# Patient Record
Sex: Female | Born: 1960 | Race: Black or African American | Hispanic: No | Marital: Single | Smoking: Current every day smoker
Health system: Southern US, Community
[De-identification: ages and names within clinical notes are randomized; demographics above are authoritative.]

## PROBLEM LIST (undated history)

## (undated) DIAGNOSIS — J45909 Unspecified asthma, uncomplicated: Secondary | ICD-10-CM

## (undated) DIAGNOSIS — F419 Anxiety disorder, unspecified: Secondary | ICD-10-CM

## (undated) DIAGNOSIS — T7840XA Allergy, unspecified, initial encounter: Secondary | ICD-10-CM

## (undated) DIAGNOSIS — E119 Type 2 diabetes mellitus without complications: Secondary | ICD-10-CM

## (undated) DIAGNOSIS — F32A Depression, unspecified: Secondary | ICD-10-CM

## (undated) DIAGNOSIS — D649 Anemia, unspecified: Secondary | ICD-10-CM

## (undated) DIAGNOSIS — F329 Major depressive disorder, single episode, unspecified: Secondary | ICD-10-CM

## (undated) DIAGNOSIS — C801 Malignant (primary) neoplasm, unspecified: Secondary | ICD-10-CM

## (undated) DIAGNOSIS — E7800 Pure hypercholesterolemia, unspecified: Secondary | ICD-10-CM

## (undated) DIAGNOSIS — H3552 Pigmentary retinal dystrophy: Secondary | ICD-10-CM

## (undated) DIAGNOSIS — H269 Unspecified cataract: Secondary | ICD-10-CM

## (undated) DIAGNOSIS — K219 Gastro-esophageal reflux disease without esophagitis: Secondary | ICD-10-CM

## (undated) DIAGNOSIS — I10 Essential (primary) hypertension: Secondary | ICD-10-CM

## (undated) DIAGNOSIS — R011 Cardiac murmur, unspecified: Secondary | ICD-10-CM

## (undated) DIAGNOSIS — K529 Noninfective gastroenteritis and colitis, unspecified: Secondary | ICD-10-CM

## (undated) DIAGNOSIS — G4730 Sleep apnea, unspecified: Secondary | ICD-10-CM

## (undated) DIAGNOSIS — I639 Cerebral infarction, unspecified: Secondary | ICD-10-CM

## (undated) HISTORY — DX: Type 2 diabetes mellitus without complications: E11.9

## (undated) HISTORY — PX: COLONOSCOPY: SHX174

## (undated) HISTORY — PX: RETINAL CRYOABLATION: SHX2337

## (undated) HISTORY — DX: Anxiety disorder, unspecified: F41.9

## (undated) HISTORY — DX: Anemia, unspecified: D64.9

## (undated) HISTORY — DX: Noninfective gastroenteritis and colitis, unspecified: K52.9

## (undated) HISTORY — DX: Unspecified cataract: H26.9

## (undated) HISTORY — DX: Cerebral infarction, unspecified: I63.9

## (undated) HISTORY — PX: IUD REMOVAL: SHX5392

## (undated) HISTORY — DX: Allergy, unspecified, initial encounter: T78.40XA

## (undated) HISTORY — PX: CATARACT EXTRACTION: SUR2

## (undated) HISTORY — PX: BONE GRAFT HIP ILIAC CREST: SUR159

## (undated) HISTORY — DX: Malignant (primary) neoplasm, unspecified: C80.1

---

## 1999-12-17 ENCOUNTER — Encounter: Payer: Self-pay | Admitting: Obstetrics & Gynecology

## 1999-12-17 ENCOUNTER — Encounter (INDEPENDENT_AMBULATORY_CARE_PROVIDER_SITE_OTHER): Payer: Self-pay

## 1999-12-17 ENCOUNTER — Ambulatory Visit (HOSPITAL_COMMUNITY): Admission: RE | Admit: 1999-12-17 | Discharge: 1999-12-17 | Payer: Self-pay | Admitting: *Deleted

## 1999-12-23 ENCOUNTER — Ambulatory Visit (HOSPITAL_COMMUNITY): Admission: RE | Admit: 1999-12-23 | Discharge: 1999-12-23 | Payer: Self-pay | Admitting: Orthopaedic Surgery

## 1999-12-23 ENCOUNTER — Encounter: Payer: Self-pay | Admitting: Orthopaedic Surgery

## 2000-04-04 ENCOUNTER — Ambulatory Visit (HOSPITAL_BASED_OUTPATIENT_CLINIC_OR_DEPARTMENT_OTHER): Admission: RE | Admit: 2000-04-04 | Discharge: 2000-04-04 | Payer: Self-pay | Admitting: Orthopaedic Surgery

## 2001-06-06 ENCOUNTER — Ambulatory Visit (HOSPITAL_BASED_OUTPATIENT_CLINIC_OR_DEPARTMENT_OTHER): Admission: RE | Admit: 2001-06-06 | Discharge: 2001-06-06 | Payer: Self-pay | Admitting: Orthopedic Surgery

## 2002-06-20 HISTORY — PX: OOPHORECTOMY: SHX86

## 2003-10-22 ENCOUNTER — Ambulatory Visit (HOSPITAL_BASED_OUTPATIENT_CLINIC_OR_DEPARTMENT_OTHER): Admission: RE | Admit: 2003-10-22 | Discharge: 2003-10-22 | Payer: Self-pay | Admitting: Orthopedic Surgery

## 2003-10-22 ENCOUNTER — Ambulatory Visit (HOSPITAL_COMMUNITY): Admission: RE | Admit: 2003-10-22 | Discharge: 2003-10-22 | Payer: Self-pay | Admitting: Orthopedic Surgery

## 2004-06-30 ENCOUNTER — Encounter: Admission: RE | Admit: 2004-06-30 | Discharge: 2004-06-30 | Payer: Self-pay | Admitting: Orthopedic Surgery

## 2004-07-08 ENCOUNTER — Other Ambulatory Visit: Admission: RE | Admit: 2004-07-08 | Discharge: 2004-07-08 | Payer: Self-pay | Admitting: Obstetrics and Gynecology

## 2004-07-13 ENCOUNTER — Ambulatory Visit (HOSPITAL_COMMUNITY): Admission: RE | Admit: 2004-07-13 | Discharge: 2004-07-13 | Payer: Self-pay | Admitting: Obstetrics and Gynecology

## 2004-08-24 ENCOUNTER — Other Ambulatory Visit: Admission: RE | Admit: 2004-08-24 | Discharge: 2004-08-24 | Payer: Self-pay | Admitting: Obstetrics and Gynecology

## 2004-08-25 ENCOUNTER — Ambulatory Visit (HOSPITAL_BASED_OUTPATIENT_CLINIC_OR_DEPARTMENT_OTHER): Admission: RE | Admit: 2004-08-25 | Discharge: 2004-08-25 | Payer: Self-pay | Admitting: Orthopedic Surgery

## 2005-02-23 ENCOUNTER — Other Ambulatory Visit: Admission: RE | Admit: 2005-02-23 | Discharge: 2005-02-23 | Payer: Self-pay | Admitting: Obstetrics and Gynecology

## 2005-03-02 ENCOUNTER — Ambulatory Visit (HOSPITAL_COMMUNITY): Admission: RE | Admit: 2005-03-02 | Discharge: 2005-03-02 | Payer: Self-pay | Admitting: Obstetrics and Gynecology

## 2005-03-02 ENCOUNTER — Encounter (INDEPENDENT_AMBULATORY_CARE_PROVIDER_SITE_OTHER): Payer: Self-pay | Admitting: Specialist

## 2009-04-12 ENCOUNTER — Emergency Department (HOSPITAL_COMMUNITY): Admission: EM | Admit: 2009-04-12 | Discharge: 2009-04-12 | Payer: Self-pay | Admitting: Emergency Medicine

## 2009-08-27 ENCOUNTER — Emergency Department (HOSPITAL_COMMUNITY): Admission: EM | Admit: 2009-08-27 | Discharge: 2009-08-28 | Payer: Self-pay | Admitting: Emergency Medicine

## 2009-12-30 ENCOUNTER — Encounter: Admission: RE | Admit: 2009-12-30 | Discharge: 2010-03-15 | Payer: Self-pay | Admitting: Ophthalmology

## 2010-01-13 ENCOUNTER — Emergency Department (HOSPITAL_COMMUNITY): Admission: EM | Admit: 2010-01-13 | Discharge: 2010-01-13 | Payer: Self-pay | Admitting: Emergency Medicine

## 2010-11-05 NOTE — Op Note (Signed)
NAME:  Susan Farley, Susan Farley              ACCOUNT NO.:  [id number]   MEDICAL RECORD NO.:  [id number]          PATIENT TYPE:  AMB   LOCATION:  SDC                           FACILITY:  [facility]   PHYSICIAN:  Guy Sandifer. Henderson Cloud, M.D. DATE OF BIRTH:  04-26-1961   DATE OF PROCEDURE:  03/02/2005  DATE OF DISCHARGE:                                 OPERATIVE REPORT   PREOPERATIVE DIAGNOSIS:  1.  Uterine leiomyomata.  2.  Uterine endocavitary masses.  3.  Right ovarian cyst.   POSTOPERATIVE DIAGNOSIS:  1.  Severe endometriosis.  2.  Right ovarian endometrioma.  3.  Uterine leiomyomata.   PROCEDURE:  Laparoscopy with right salpingo-oophorectomy. Hysteroscopy with  resection of submucous fibroid. Dilatation curettage and 1% Xylocaine  paracervical block.   SURGEON:  Guy Sandifer. Henderson Cloud, M.D.   ANESTHESIA:  General with endotracheal intubation.   SPECIMEN:  1.  Right tube and ovary.  2.  Submucous fibroid.  3.  Endometrial mass.   ESTIMATED BLOOD LOSS:  50 mL.   I&O OF SORBITOL DISTENDING MEDIA:  60 mL deficit (a portion of that on the  floor)   INDICATIONS AND CONSENT:  The patient is a 50 year old single black female,  gravida 1, para 0, status post endometrial ablation with increasingly  frequent menses. Details are dictated history and physical. Hysteroscopy  with resectoscope, D&C, laparoscopy with probable right salpingo-  oophorectomy has been discussed with the patient. Potential risks and  complications have been reviewed preoperatively including but not limited to  infection, bowel, bladder, ureteral damage, bleeding requiring transfusion  of blood products with possible transfusion reaction, HIV and hepatitis  acquisition, DVT, PE, pneumonia, laparotomy, recurrent pain or abnormal  bleeding. All questions were answered and consent signed on the chart.   FINDINGS:  There is an adhesion of the omentum just superior to the  umbilicus. This was not involve the bowel. There are no closed  loops. In the  pelvis, uterus has multiple subserous leiomyomata including at least one  approximately 3-4 cm pedunculated on the anterior fundus and at least one 2-  3 cm pedunculated on the posterior uterine fundus. The peritoneum contains  diffuse hemosiderin deposition in the cul-de-sacs. The left ovary appears  normal. Left fallopian tube is normal. Right ovary contains a 6 cm  translucent cyst. It is adherent to the right pelvic side wall with filmy  adhesions. The fimbria of the right fallopian tubes are adherent beneath the  right ovary.   PROCEDURE:  The patient is taken to operating room where she is identified,  placed in dorsosupine position and general anesthesia is induced via  endotracheal intubation. She is then placed in dorsal lithotomy position  where she is prepped abdominally and vaginally with a prep soap, straight  catheterized and draped in sterile fashion. Bivalve speculum was placed in  the vagina. The anterior cervical lip was injected with 1% Xylocaine and  grasped with single-tooth tenaculum. Paracervical block is placed at 2, 4,  7, 8, 10 o'clock positions with approximately 20 mL total of 1% plain  Xylocaine. Cervix was gently progressively dilated to 33  Pratt dilator. The  resectoscope with a single right-angle wire loop was then placed in  endocervical canal advanced under direct visualization using sorbitol  distending media. There is a 1 cm submucous fibroid on a relatively narrow  stalk on the left posterior endometrial canal. Fallopian tube ostia are  identified bilaterally. There is a second area of tissue on the posterior  endometrial canal possibly consistent with a polyp. Both of these were  resected separately without difficulty. Hysteroscope was removed and sharp  curettage is carried out. The single-tooth tenaculum was replaced with a  Hulka tenaculum. Attention is turned to the abdomen. An infraumbilical  incision was made and a disposable  Veress needle was placed. Normal syringe  and drop test were noted. 2 liters of gas were insufflated under low  pressure with good tympany in the right upper quadrant. Veress needle is  removed and a 10/11 disposable XL bladeless trocar sleeve was placed using  the direct visualization method using the laparoscope. Placement is further  verified with the laparoscope and trocar was then removed and no damage to  surrounding structures is noted. Small suprapubic incision was made and a 5  mm bladeless disposable XL trocar sleeve was placed under direct  visualization without difficulty. The above findings were noted. The right  ovarian cyst is aspirated for a small amount of old blood consistent with an  endometrioma. Portion of this was sent to the lab for cytology. The  remainder of the cyst was drained and irrigated with the irrigation device.  The ovary frees from the right pelvic sidewall bluntly without difficulty.  Course of the right ureter is noted to be well clear surgery. Then using the  gyrus bipolar cautery cutting instrument an RSO is performed with good  mobilization of the pedicles from the right pelvic sidewall. Inspection of  the pedicles under reduced pneumoperitoneum reveals good hemostasis. Then  using the 5 mm laparoscope through the suprapubic trocar sleeve, the  umbilical incision is extended approximately 1 cm on the fascia with Mayo  scissors while watching through the 5 mm scope to assure that this was well  clear of any underlying structures. The EndoCatch bag is then placed and the  right tube and ovary removed in the bag under direct visualization through  the 5 mm scope without difficulty. The 10/11 trocar sleeve is then replaced  and then using the operative scope again careful inspection reveals good  hemostasis all around. Excess fluid is removed. Suprapubic trocar sleeve was removed and no bleeding is noted. All instruments are removed and  pneumoperitoneum  was completely reduced. The umbilical incision was closed  first with a figure-of-eight in the fascia with good mobilization from  underlying structures. The skin in the umbilicus was closed with interrupted  3-0 Vicryl. Both incisions are injected with 1/2% plain Marcaine and the  skin was closed on both with Dermabond. Hulka tenaculum is removed and good  hemostasis was noted. All counts correct. The patient is awakened, taken to  recovery room in stable condition.      Guy Sandifer Henderson Cloud, M.D.  Electronically Signed     JET/MEDQ  D:  03/02/2005  T:  03/02/2005  Job:  [id number]

## 2010-11-05 NOTE — H&P (Signed)
NAME:  Susan Farley, Susan Farley              ACCOUNT NO.:  [id number]   MEDICAL RECORD NO.:  [id number]          PATIENT TYPE:  AMB   LOCATION:  SDC                           FACILITY:  [facility]   PHYSICIAN:  Guy Sandifer. Henderson Cloud, M.D. DATE OF BIRTH:  04-16-1961   DATE OF ADMISSION:  03/02/2005  DATE OF DISCHARGE:                                HISTORY & PHYSICAL   CHIEF COMPLAINT:  Ovarian cyst and heavy menses.   INDICATIONS AND CONSENT:  This patient is a 50 year old, single, black  female, G1, P0, status post endometrial ablation who has had more frequent  menses, most recently about every 20 days.  While having an abdominal/pelvic  CAT scan, she was noted to have a finding of a 6.9-cm complex right adnexal  mass.  The abdominal ultrasound was otherwise without abnormality except for  tiny bilateral renal and hepatic incidental cysts.  Ultrasound in my office,  on July 14, 2004, revealed the uterus measuring 8.5 x 4.2 x 6.2-cm with  multiple leiomyomata ranging from 7-to-27-mm.  A right ovarian cyst  measuring 7-cm in greatest dimension was noted consistent with either a  dermoid or a endometrioma.  Followup ultrasound, on Oct 26, 2004, revealed  the right ovarian cyst to be unchanged.  Sonohistogram also revealed  multiple polypoid type masses in the endometrial cavity which again appeared  unchanged with a followup ultrasound in May.  The patient has been  recovering from surgery done to her arm which involved bone grafts from her  hip.  She now feels she has sufficiently recovered to proceed with surgery.  She is being admitted for laparoscopy, right salpingo-oophorectomy, and  hysteroscopy D&C.  Potential risks and complications have been reviewed with  the patient preoperatively.   PAST MEDICAL HISTORY:  1.  Asthma.  2.  Esophageal reflux.  3.  History of mononucleosis, 1978.   PAST SURGICAL HISTORY:  1.  Left arm surgery with bone graft.  2.  Endometrial ablation.  3.  Benign breast  biopsy.   OBSTETRIC HISTORY:  Termination x1, 1985.   SOCIAL HISTORY:  The patient smokes two packs of cigarettes a day.  Denies  alcohol or drug abuse.   MEDICATIONS:  1.  Prometrium daily.  2.  Celexa daily.   No known drug allergies.  She is allergic to:  1.  SHELLFISH.  2.  TOMATOES.  3.  HORSES.  4.  ENVIRONMENTAL ALLERGIES.  5.  TURKEYS.   REVIEW OF SYSTEMS:  NEURO:  Denies headache.  CARDIAC:  Denies chest pain.  PULMONARY:  Denies shortness of breath.   PHYSICAL EXAMINATION:  VITAL SIGNS:  Height 5 feet 4 inches, weight 210  pounds, blood pressure 104/68.  HEENT:  Without thyromegaly.  LUNGS:  Clear to auscultation.  HEART:  Regular rate and rhythm.  BACK:  Without CVA tenderness.  BREASTS:  Without masses, tracks, discharge.  ABDOMEN:  Obese, soft, nontender without palpable masses.  PELVIC:  Vulva, vagina, cervix without lesion.  Uterus is enlarged.  Adnexal  exam is highly compromised secondary to voluntary patient guarding.  Right  adnexal mass is palpated.  Left adnexa nontender without palpable masses.  EXTREMITIES:  Grossly within normal limits.  NEUROLOGIC:  Grossly within normal limits.   ASSESSMENT:  1.  Uterine leiomyomata.  2.  Uterine endocavitary masses.  3.  Right ovarian cyst.   PLAN:  1.  Laparoscopy.  2.  Probable right salpingo-oophorectomy.  3.  Hysteroscopy with a resectoscope D&C.      Guy Sandifer Henderson Cloud, M.D.  Electronically Signed     JET/MEDQ  D:  02/23/2005  T:  02/23/2005  Job:  [id number]

## 2010-11-05 NOTE — H&P (Signed)
NAME:  Susan Farley, Susan Farley              ACCOUNT NO.:  [id number]   MEDICAL RECORD NO.:  [id number]          PATIENT TYPE:  AMB   LOCATION:  DSC                          FACILITY:  [facility]   PHYSICIAN:  Artist Pais. Weingold, M.D.DATE OF BIRTH:  October 03, 1960   DATE OF ADMISSION:  08/25/2004  DATE OF DISCHARGE:                                HISTORY & PHYSICAL   PREOPERATIVE DIAGNOSES:  1.  Retained hardware, left forearm.  2.  Extensor contracture.   POSTOPERATIVE DIAGNOSES:  1.  Retained hardware, left forearm.  2.  Extensor contracture.   PROCEDURE:  1.  Hardware removal, left forearm.  2.  Extensor tenolysis.   SURGEON:  Artist Pais. Mina Marble, M.D.   ASSISTANT:  None.   ANESTHESIA:  General.   TOURNIQUET TIME:  35 minutes.   COMPLICATIONS:  None.   DRAINS:  None.   DESCRIPTION OF PROCEDURE:  The patient was taken to the operating room.  After the induction of adequate general anesthesia, the left upper extremity  was prepped and draped in the usual sterile fashion.  An Esmarch was used to  exsanguinate the limb.  Tourniquet was inflated to 250 mmHg.  At this point  in time using the previous incision from a shortening osteotomy and bone  grafting, the skin was incised for a distance of 8 cm through skin and  subcutaneous tissues.  The extensor mechanism overlying the plate was  identified with significant scar down to the plate, and the ECU was  tenolysed for a distance of 10 cm.  Once this was done, the plate was  subsequently removed  using large and small size screwdriver and Therapist, nutritional.  The wound was  then thoroughly irrigated and closed with a running 3-0 Prolene subcuticular  stitch.  Steri-Strips, 4 x 4 fluffs, and a volar splint were applied.  The  patient tolerated the procedure well and went to recovery in stable fashion.      MAW/MEDQ  D:  08/25/2004  T:  08/25/2004  Job:  [id number]

## 2010-11-05 NOTE — Op Note (Signed)
[place]  Patient:    Susan Farley, Susan Farley                       MRN: [id number] Proc. Date: 04/04/00 Adm. Date:  [id number] Attending:  Marcene Corning                           Operative Report  PREOPERATIVE DIAGNOSES: 1. Left wrist triangular fibrocartilage tear. 2. Left wrist synovitis.  POSTOPERATIVE DIAGNOSES: 1. Left wrist triangular fibrocartilage tear. 2. Left wrist synovitis.  PROCEDURES: 1. Left wrist arthroscopic resection, triangular fibrocartilage tear. 2. Left wrist arthroscopic synovectomy.  ANESTHESIA:  General.  SURGEON:  Lubertha Basque. Jerl Santos, M.D.  ASSISTANT:  Prince Rome, P.A.  INDICATION FOR PROCEDURE:  The patient is a 50 year old woman a very long time out from work-related injury to her wrist.  She has persisted with difficulty despite immobilization and oral anti-inflammatories.  She has also been given an injection which afforded her transient relief.  She has undergone some preoperative nerve conduction studies, which were normal.  She then underwent a preoperative MRI arthrogram, which revealed a TFC tear in her wrist.  She had this problem along with a consequent synovitis.  At this point, she is offered an arthroscopy.  The procedure was discussed with the patient, and informed operative consent was obtained after discussion of possible complications of, reactions to the anesthesia, and infection.  DESCRIPTION OF PROCEDURE:  The patient was taken to the operating suite, where general anesthetic was applied without difficulty.  She was positioned supine and prepped and draped in normal sterile fashion.  After the administration of preoperative IV antibiotics, an arthroscopy of the left wrist was performed through two dorsal portals.  The index and middle fingers were placed in fingertraps, and five to 10 pounds of pressure were applied across a tower. The wrist was then injected with 5 or 6 cc of saline.  A  small portal was made about a centimeter distal to Listers tubercle.  A skin incision was made, followed by blunt dissection down to the capsule and blunt entry into the wrist.  The scope was placed through this portal.  An ulnar portal was made initially with a small needle in the 6R location.  Again a skin incision was made with blunt dissection and entry into the capsule.  This was our instrument portal.  Gravity was used to pass the irrigation through the wrist. The intrinsic ligaments on the radial aspect of the joint were normal.  There were no degenerative changes in the scaphoid or lunate fossa.  The scapholunate ligament appeared normal and under surface inspection.  The TFC did have a small tear near the radial attachment with a flap that appeared to be irritating the joint with some consequent synovitis in the area.  A thorough synovectomy was performed, followed by partial excision of the TFC. The area was saucerized with a basket and full-radius resector.  The peripheral attachment of the TFC appeared to be intact.  The joint was then thoroughly irrigated, followed by placement of Marcaine with epinephrine and morphine.  Simple sutures of nylon were used to reapproximate the portals, followed by Adaptic and dry gauze with an Ace wrap.  Estimated blood loss and intraoperative fluids can be obtained from the anesthesia records.  DISPOSITION:  The patient was extubated in the operating room and taken to the recovery room in stable condition.  Plans were  for her to go home the same day and to follow up in the office in less than a week.  I will contact her by phone tonight. DD:  04/04/00 TD:  04/04/00 Job: [id number]

## 2010-11-05 NOTE — Op Note (Signed)
[place]  Patient:    Susan Farley, MARIK Visit Number: [id number] MRN: [id number]          Service Type: Attending:  Artist Pais. Mina Marble, M.D. Dictated by:   Artist Pais Mina Marble, M.D. Proc. Date: 06/06/01                             Operative Report  PREOPERATIVE DIAGNOSIS:   Left wrist triangular fibrocartilage complex tear.  POSTOPERATIVE DIAGNOSIS:  Left wrist triangular fibrocartilage complex tear.  PROCEDURE:  Left wrist arthroscopy with debridement of left TFCC tear and dorsal synovectomy.  SURGEON:  Artist Pais. Mina Marble, M.D.  ASSISTANT:  Junius Roads. Ireton, P.A.C.  ANESTHESIA:  General.  TOURNIQUET TIME:  41 minutes.  COMPLICATIONS:  None.  DRAINS:  None.  DESCRIPTION:  Patient was taken to the operating room where after the induction of adequate general anesthesia the left upper extremity was prepped and draped in usual sterile fashion.  An Esmarch was used to exsanguinate the limb.  The tourniquet was inflated to 250 mmHg.  At this point in time, the left hand, wrist, and arm were padded and placed in the Concept wrist traction tower with 12 pounds of countertraction placed across the radiocarpal joint. A standard 3-4 arthroscopic portal was established 1 cm distal to Listers tubercle after the skin was incised sharply, with blunt dissection was carried down to the capsule, the scope was introduced into the joint.  Visualization of the joint revealed intact radial-side ligaments, intact scapholunate and osseous ligament, obvious TFCC central tear, and significant dorsal and ulnar-side synovitis.  Next, an 18-gauge needle was used to establish a 6U outflow portal under direct vision, followed by establishment of 4-5 working portal under direct vision.  At this point in time the TFCC was debrided using a combination of 2.9 suction shaver and 1.5 mm microablation wand.  After a stable rim had been established, the instruments were  then switched.  The scope was placed in a 4-5 portal, instruments in the 3-4 portal, and a dorsal synovectomy was performed.  After this was done visualization revealed no significant other cartilaginous or osseus abnormalities.  The instruments were removed from the portals, the portals were closed with 4-0 nylon and 0.25% plain Marcaine was injected for postoperative pain control through the 6U outflow portal.  A sterile dressing of Xeroform, 4x4s, fluffs, and a volar splint were applied.  The patient tolerated the procedure well and went to recovery room in stable fashion. Dictated by:   Artist Pais Mina Marble, M.D. Attending:  Artist Pais Mina Marble, M.D. DD:  06/06/01 TD:  06/06/01 Job: [id number]

## 2010-11-05 NOTE — Op Note (Signed)
Susan Farley, Susan Farley                          ACCOUNT NO.:  [id number]   MEDICAL RECORD NO.:  [id number]                   PATIENT TYPE:  AMB   LOCATION:  DSC                                  FACILITY:  [facility]   PHYSICIAN:  Artist Pais. Mina Marble, M.D.           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   PROCEDURE:  Iliac crest bone graft to left ulna nonunion.   SURGEON:  Artist Pais. Mina Marble, M.D.   ASSISTANT:  Aura Fey. Bobbe Medico.   ANESTHESIA:  General.   TOURNIQUET TIME:  1 hour 30 minutes.   COMPLICATIONS:  None.   DRAINS:  None.   PATHOLOGY:  Cultures x 2 were sent.   DESCRIPTION OF PROCEDURE:  The patient was taken to the operating room.  After the induction of adequate general anesthesia, the left upper extremity  and left iliac crest area were prepped and draped in the usual sterile  fashion.  Once this was done, an Esmarch was used to exsanguinate the left  upper extremity.  Tourniquet was inflated to 250 mmHg.  At this point in  time, using old incision from shortening osteotomy, incision was taken down  through the skin and subcutaneous tissue.  The interval between the FCU and  ECU were identified, the plate was identified.  The plate was secured  distally but loosened proximally.  The plate was removed.  The nonunion site  was debrided using keratome and rongeurs, and a sagittal saw was used to  remove 1/2 to 3/4 inch section of bone at the osteotomy site.  Once this was  done, cultures were taken of both the bones and soft tissues. After this was  done, a second incision was made over the iliac crest area on the left side.  Incision was taken down through skin and subcutaneous tissue.   Dissection  was carried down to the palpable bore of the iliac crest.  Subperiosteal  dissection was undertaken, and tricortical bone graft was taken.  This  cortical bone graft was then placed at the osteotomy site and fixed with a 9-  hole large fragment plate using the distal screw holes from the previous  plate and new drill holes for the proximal.  This was done under  compression.  Intraoperative x-rays showed good reduction of both AP and  lateral plane as well as ulnar neutral variants.  The remaining aspect of  the osteotomy site was packed with synthetic bone graft.  The iliac crest  bone graft site was closed with 0 Vicryl, 2-0 Vicryl and 4-0 Monocryl  subcuticular on the  skin, and the ulna on the left side was closed with 2-0  Vicryl and a 3-0  Prolene subcuticular stitch.  Steri-Strips, 4 x 4 fluffs, and compressive  dressing were applied to the iliac crest bone site and a sugar tong splint  to the left upper extremity.  The patient tolerated the procedure well and  went to recovery in stable fashion.                                               Artist Pais Mina Marble, M.D.    MAW/MEDQ  D:  10/22/2003  T:  10/22/2003  Job:  [id number]

## 2010-11-05 NOTE — H&P (Signed)
[facility]  Patient:    Susan Farley, Susan Farley                       MRN: [id number] Attending:  Cecilio Asper, M.D.                         History and Physical  DATE OF BIRTH:                May 05, 1961  HISTORY:                      Patient is a 50 year old gravida 0 who had her annual examination at the [facility].  She presents to [facility] to discuss her menstrual periods.  She states that her cramps have been worsening over the past three years and her menses are lasting for one week with cramping lasting for at least six days.  Her periods are so heavy that she must change a tampon and a pad every hour and a half.  This all began after an elective abortion in 1985.  Patient denies a history of fibroids and she has not had any blood work to work up her menorrhagia.  She also states she has a lot of clots.  Patient had been on oral contraceptives at the age of 24, but had blood pressure problems and therefore was taken off this form of medication.  Patient is now presenting for definitive therapy.  ALLERGIES:                    Reaction to AUGMENTIN.  MEDICATIONS:                  None chronically at the present time except ibuprofen as needed for cramping.  PAST MEDICAL HISTORY:         She has a remote history of asthma.  Takes allergy medicines at the present time.  She also has a history of migraines. Patient had an abnormal Pap smear in June 2000.  It was rechecked and all was within normal limits.  She has a remote history of Trichomonas and denies other significant sexually transmitted infections.  PAST SURGICAL HISTORY:        Positive for wisdom tooth extraction and a cyst removed from her ear in 1995.  SOCIAL HISTORY:               Patient smokes two and a half packs of tobacco per day and has done so for 20 years.  She drinks alcoholic beverages approximately three times per year.  She denies recreational drug  use.  She denies domestic violence.  She works as a Solicitor for [employer].  FAMILY HISTORY:                Unknown.  The patient is adopted.  REVIEW OF SYSTEMS:            Noncontributory.  PHYSICAL EXAMINATION:  VITAL SIGNS:                  Blood pressure 120/60.  Weight 203 pounds. Height 5 feet 5 inches.  HEENT:                        Within normal limits.  NECK:  Thyroid is not enlarged.  CHEST:                        Clear to auscultation bilaterally.  HEART:                        Regular rate and rhythm.  BREASTS:                      No masses, discharge, skin changes, or nipple retraction.  BACK:                         Within normal limits.  ABDOMEN:                      No tenderness, masses, or organomegaly.  EXTREMITIES:                  No clubbing, cyanosis, or edema.  PELVIC:                       Normal external female genitalia.  Vagina is without discharge.  Cervix is without lesions.  Uterus is upper limits of normal size but no masses could be appreciated.  Adnexa is without masses and nontender.  RECTAL:                       Deferred.  LABORATORIES:                 Gynecologic ultrasound from Nov 01, 1999 shows a normal sized uterus, but small uterine fibroids the largest measuring 1.7 cm and pedunculated.                                TSH and prolactin are within normal limits.  ASSESSMENT:                   Menorrhagia, unknown etiology, dysmenorrhea.  PLAN:                         Multiple options were reviewed with the patient.  She is not a candidate for OCPs secondary to tobacco use.  Reviewed the use of Depo-Provera, progesterone secreting IUD, hysterectomy, and endometrial ablative therapy.  Patient desires cryo ablation.  Risks and benefits of the procedure were reviewed.  Patient has therefore been consented and will have the procedure performed on June 29. DD:  12/08/99 TD:  12/08/99 Job:  [id number]

## 2010-11-05 NOTE — Op Note (Signed)
[facility]  Patient:    Susan Farley, Susan Farley                       MRN: [id number] Proc. Date: 12/17/99 Adm. Date:  [id number] Disc. Date: [id number] Attending:  Cleatrice Burke                           Operative Report  PREOPERATIVE DIAGNOSES:       Menorrhagia and fibroids.  POSTOPERATIVE DIAGNOSIS:  SURGEON:                      Cecilio Asper, M.D.  PROCEDURE:                    Ultrasound interpretation, hysteroscopy, dilatation and curettage and cryoablation.  ANESTHESIA:                   General.  ESTIMATED BLOOD LOSS:         Minimal.  FLUID BALANCE:                Equal.  COMPLICATIONS:                None.  HISTORY:                      Patient is a 50 year old gravida 0 with a history of menorrhagia; patient was also noted to have small fibroids.  Upon review of her options, the patient desired endometrial ablation and it was chosen to have the cryoablation procedure.  DESCRIPTION OF PROCEDURE:     After adequate level of general anesthesia was obtained, patient was prepped and draped in the dorsal lithotomy position in sterile fashion.  The bladder was drained of urine.  Speculum was placed inside of the vagina.  A single-tooth tenaculum was placed on the anterior lip of the cervix.  The cervix was sounded and found to be 8 cm in depth.  It was then dilated to a #29 [place] using the [employer].  A hysteroscope was placed inside of the uterus and visualization was performed.  There was a moderate amount of tissue that was noted.  Pictures were taken.  Hysteroscope was then removed and a medium sharp curette was used to perform a sharp curettage.  At that point, the cryoablative instrument was placed in the left cornu under direct visualization.  The freezing was performed for approximately six minutes and upon warming, the probe was then redirected into the right cornu of the uterus.  Again, freezing occurred for  approximately six minutes.  All of this was done under direct ultrasound guidance.  The probe was then heated and removed.  Vaginal instruments were then removed.  Patient tolerated the procedure well.  She was taken to recovery room in stable condition.  Input of sorbitol equalled the amount that was removed and patients needle, sponge and instrument counts were correct x 2. DD:  01/07/00 TD:  01/10/00 Job: [id number]

## 2011-11-29 ENCOUNTER — Encounter (HOSPITAL_COMMUNITY): Payer: Self-pay | Admitting: *Deleted

## 2011-11-29 ENCOUNTER — Emergency Department (HOSPITAL_COMMUNITY)
Admission: EM | Admit: 2011-11-29 | Discharge: 2011-11-30 | Disposition: A | Discharge status: Home / Self Care | Payer: Federal, State, Local not specified - PPO | Attending: Emergency Medicine | Admitting: Emergency Medicine

## 2011-11-29 DIAGNOSIS — K529 Noninfective gastroenteritis and colitis, unspecified: Secondary | ICD-10-CM

## 2011-11-29 DIAGNOSIS — R109 Unspecified abdominal pain: Secondary | ICD-10-CM | POA: Insufficient documentation

## 2011-11-29 DIAGNOSIS — R599 Enlarged lymph nodes, unspecified: Secondary | ICD-10-CM | POA: Insufficient documentation

## 2011-11-29 DIAGNOSIS — K5289 Other specified noninfective gastroenteritis and colitis: Secondary | ICD-10-CM | POA: Insufficient documentation

## 2011-11-29 DIAGNOSIS — I10 Essential (primary) hypertension: Secondary | ICD-10-CM | POA: Insufficient documentation

## 2011-11-29 HISTORY — DX: Essential (primary) hypertension: I10

## 2011-11-29 HISTORY — DX: Pure hypercholesterolemia, unspecified: E78.00

## 2011-11-29 LAB — COMPREHENSIVE METABOLIC PANEL
ALT: 12 U/L (ref 0–35)
AST: 13 U/L (ref 0–37)
Calcium: 9.9 mg/dL (ref 8.4–10.5)
GFR calc Af Amer: >90 mL/min (ref >90)
Glucose, Bld: 119 mg/dL — ABNORMAL HIGH (ref 70–99)
Sodium: 139 mEq/L (ref 135–145)
Total Protein: 7.8 g/dL (ref 6.0–8.3)

## 2011-11-29 LAB — DIFFERENTIAL
Eosinophils Relative: 1 % (ref 0–5)
Lymphocytes Relative: 14 % (ref 12–46)
Lymphs Abs: 2.1 10*3/uL (ref 0.7–4.0)
Neutro Abs: 11.8 10*3/uL — ABNORMAL HIGH (ref 1.7–7.7)
Neutrophils Relative %: 79 % — ABNORMAL HIGH (ref 43–77)

## 2011-11-29 LAB — URINALYSIS, ROUTINE W REFLEX MICROSCOPIC
Leukocytes, UA: NEGATIVE
Specific Gravity, Urine: 1.026 (ref 1.005–1.030)
Urobilinogen, UA: 0.2 mg/dL (ref 0.0–1.0)

## 2011-11-29 LAB — URINE MICROSCOPIC-ADD ON

## 2011-11-29 LAB — CBC
MCV: 90 fL (ref 78.0–100.0)
Platelets: 331 10*3/uL (ref 150–400)
RBC: 4.78 MIL/uL (ref 3.87–5.11)
WBC: 14.9 10*3/uL — ABNORMAL HIGH (ref 4.0–10.5)

## 2011-11-29 NOTE — ED Notes (Signed)
Pt c/o having abd pain since Sunday; took stool softener on Saturday; diarrhea since Sunday; no appetite.  Seen at Urgent Care and told had elevated wbc count and rebound tenderness; Sent for evaluation.

## 2011-11-29 NOTE — ED Notes (Signed)
Pt in restroom getting urine sample. Will get labs when finished.

## 2011-11-30 ENCOUNTER — Emergency Department (HOSPITAL_COMMUNITY): Payer: Federal, State, Local not specified - PPO

## 2011-11-30 LAB — LIPASE, BLOOD: Lipase: 42 U/L (ref 11–59)

## 2011-11-30 MED ORDER — METRONIDAZOLE 500 MG PO TABS: 500.0000 mg | ORAL_TABLET | Freq: Two times a day (BID) | ORAL | Status: AC

## 2011-11-30 MED ORDER — DICYCLOMINE HCL 10 MG/ML IM SOLN
10.0000 mg | Freq: Once | INTRAMUSCULAR | Status: AC
  Administered 2011-11-30: 10 mg via INTRAMUSCULAR

## 2011-11-30 MED ORDER — SODIUM CHLORIDE 0.9 % IV BOLUS (SEPSIS)
1000.0000 mL | Freq: Once | INTRAVENOUS | Status: AC
  Administered 2011-11-30: 1000 mL via INTRAVENOUS

## 2011-11-30 MED ORDER — DICYCLOMINE HCL 10 MG/ML IM SOLN
INTRAMUSCULAR | Status: AC
  Administered 2011-11-30: 10 mg via INTRAMUSCULAR
  Filled 2011-11-30: qty 2

## 2011-11-30 MED ORDER — IOHEXOL 300 MG/ML  SOLN
100.0000 mL | Freq: Once | INTRAMUSCULAR | Status: AC | PRN
  Administered 2011-11-30: 100 mL via INTRAVENOUS

## 2011-11-30 MED ORDER — HYDROCODONE-ACETAMINOPHEN 5-325 MG PO TABS: 1.0000 | ORAL_TABLET | ORAL | Status: AC | PRN

## 2011-11-30 MED ORDER — CIPROFLOXACIN HCL 500 MG PO TABS: 500.0000 mg | ORAL_TABLET | Freq: Two times a day (BID) | ORAL | Status: AC

## 2011-11-30 MED ORDER — ONDANSETRON HCL 4 MG/2ML IJ SOLN
4.0000 mg | Freq: Once | INTRAMUSCULAR | Status: AC
  Administered 2011-11-30: 4 mg via INTRAVENOUS
  Filled 2011-11-30: qty 2

## 2011-11-30 NOTE — ED Provider Notes (Signed)
History     CSN: [id number]  Arrival date & time 11/29/11  1928   First MD Initiated Contact with Patient 11/29/11 2344      Chief Complaint  Patient presents with  . Abdominal Pain    (Consider location/radiation/quality/duration/timing/severity/associated sxs/prior treatment) HPI Comments: Patient here with abdominal pain since Sunday - she states that she had some constipation on Saturday and so she took a stool softener, she states that starting on Sunday, she began with periumbilical and RLQ abdomainal pain.  She reports diarrhea since then with no appetite.  She denies fever, chills, nausea or vomiting, denies blood in the diarrhea.  Reports multiple episodes of watery diarrhea since then with crampy abdominal pain.  She was seen at [facility] and sent here for further evaluation due to leukocytosis and the tenderness.  Reports had right ovary removed but not other abdominal surgeries.  Patient is a 51 y.o. female presenting with abdominal pain. The history is provided by the patient. No language interpreter was used.  Abdominal Pain The primary symptoms of the illness include abdominal pain and diarrhea. The primary symptoms of the illness do not include fever, fatigue, shortness of breath, nausea, vomiting, hematemesis, hematochezia, dysuria, vaginal discharge or vaginal bleeding. The current episode started yesterday. The onset of the illness was gradual. The problem has not changed since onset. The patient has had a change in bowel habit. Additional symptoms associated with the illness include anorexia. Symptoms associated with the illness do not include chills, diaphoresis, heartburn, constipation, urgency, hematuria, frequency or back pain.    Past Medical History  Diagnosis Date  . Hypertension   . Hypercholesteremia     Past Surgical History  Procedure Date  . Cataract extraction   . Oophorectomy     No family history on file.  History  Substance Use Topics  . Smoking  status: Current Everyday Smoker -- 1.5 packs/day  . Smokeless tobacco: Not on file  . Alcohol Use: No    OB History    Grav Para Term Preterm Abortions TAB SAB Ect Mult Living                  Review of Systems  Constitutional: Negative for fever, chills, diaphoresis and fatigue.  Respiratory: Negative for shortness of breath.   Gastrointestinal: Positive for abdominal pain, diarrhea and anorexia. Negative for heartburn, nausea, vomiting, constipation, hematochezia and hematemesis.  Genitourinary: Negative for dysuria, urgency, frequency, hematuria, vaginal bleeding and vaginal discharge.  Musculoskeletal: Negative for back pain.  All other systems reviewed and are negative.    Allergies  Shellfish allergy  Home Medications   Current Outpatient Rx  Name Route Sig Dispense Refill  . HYDROCODONE-ACETAMINOPHEN 2.5-500 MG PO TABS Oral Take 1 tablet by mouth every 6 (six) hours as needed. Pain    . PANTOPRAZOLE SODIUM 40 MG PO TBEC Oral Take 40 mg by mouth daily.    . SERTRALINE HCL 100 MG PO TABS Oral Take 100 mg by mouth daily.    . TETRACAINE HCL 0.5 % OP SOLN Both Eyes Place 1 drop into both eyes once.    Marland Kitchen TIMOLOL HEMIHYDRATE 0.5 % OP SOLN Both Eyes Place 1 drop into both eyes 2 (two) times daily.      BP 144/75  Pulse 73  Temp 99.1 F (37.3 C) (Oral)  Resp 20  SpO2 98%  LMP 11/25/2011  Physical Exam  Nursing note and vitals reviewed. Constitutional: She is oriented to person, place, and time. She appears  well-developed and well-nourished. No distress.  HENT:  Head: Normocephalic and atraumatic.  Right Ear: External ear normal.  Left Ear: External ear normal.  Nose: Nose normal.  Mouth/Throat: Oropharynx is clear and moist. No oropharyngeal exudate.  Eyes: Conjunctivae are normal. Pupils are equal, round, and reactive to light. No scleral icterus.  Neck: Normal range of motion. Neck supple.  Cardiovascular: Normal rate, regular rhythm and normal heart sounds.   Exam reveals no gallop and no friction rub.   No murmur heard. Pulmonary/Chest: Effort normal and breath sounds normal. No respiratory distress. She has no wheezes. She has no rales. She exhibits no tenderness.  Abdominal: Soft. Bowel sounds are normal. She exhibits no distension and no mass. There is tenderness in the right lower quadrant and periumbilical area. There is no rebound and no guarding.    Musculoskeletal: Normal range of motion. She exhibits no edema and no tenderness.  Lymphadenopathy:    She has no cervical adenopathy.  Neurological: She is alert and oriented to person, place, and time. No cranial nerve deficit. She exhibits normal muscle tone. Coordination normal.  Skin: Skin is warm and dry. No rash noted. No erythema. No pallor.  Psychiatric: She has a normal mood and affect. Her behavior is normal. Judgment and thought content normal.    ED Course  Procedures (including critical care time)  Labs Reviewed  COMPREHENSIVE METABOLIC PANEL - Abnormal; Notable for the following:    Potassium 3.3 (*)     Glucose, Bld 119 (*)     Total Bilirubin 0.2 (*)     All other components within normal limits  URINALYSIS, ROUTINE W REFLEX MICROSCOPIC - Abnormal; Notable for the following:    Hgb urine dipstick TRACE (*)     All other components within normal limits  CBC - Abnormal; Notable for the following:    WBC 14.9 (*)     RDW 15.9 (*)     All other components within normal limits  DIFFERENTIAL - Abnormal; Notable for the following:    Neutrophils Relative 79 (*)     Neutro Abs 11.8 (*)     All other components within normal limits  PREGNANCY, URINE  URINE MICROSCOPIC-ADD ON  LIPASE, BLOOD   Ct Abdomen Pelvis W Contrast  11/30/2011  *RADIOLOGY REPORT*  Clinical Data: Abdominal pain  CT ABDOMEN AND PELVIS WITH CONTRAST  Technique:  Multidetector CT imaging of the abdomen and pelvis was performed following the standard protocol during bolus administration of intravenous  contrast.  Contrast: OMNIPAQUE IOHEXOL 300 MG/ML  SOLN  Comparison: 07/13/2004  Findings: Limited images through the lung bases demonstrate no significant appreciable abnormality. The heart size is within normal limits. No pleural or pericardial effusion.  Subcentimeter hypodensity within the hepatic dome.  Incompletely characterized.  Unremarkable biliary system, unremarkable biliary system, spleen, pancreas, adrenal glands.  Subcentimeter hypodensity arising from the interpolar left kidney is incompletely characterized.  A couple hypodensities within the right kidney are also incompletely characterized. No hydronephrosis or hydroureter.  No bowel obstruction.  There is a proximal colonic wall thickening and thickening of the terminal ileum.  Appendix within normal limits.  No free intraperitoneal air.  Lobular uterine contour presumably represents fibroids though is incompletely characterized by CT.  Small amount of free fluid within pelvis.  Decompressed bladder.  There is scattered atherosclerotic calcification of the aorta and its branches. No aneurysmal dilatation.  Right pelvic sidewall and bilateral inguinal lymph nodes are enlarged however without interval change.  No acute  osseous finding.  IMPRESSION: There is thickening of the proximal colon and terminal ileum. This is nonspecific, with the favored differential including infectious, ischemic, or inflammatory etiologies. Consider colonoscopy follow- up if not yet performed with age recommended screening.  Enlarged right pelvic sidewall and bilateral inguinal lymph nodes, measuring up to 3.2 cm within the right groin.  While not significantly changed from 2006, etiology is indeterminate. Has this ever been biopsied?  Bilateral renal hypodensities are incompletely characterized. Consider non emergent ultrasound follow-up.  Original Report Authenticated By: Waneta Martins, M.D.   Results for orders placed during the hospital encounter of 11/29/11   COMPREHENSIVE METABOLIC PANEL      Component Value Range   Sodium 139  135 - 145 mEq/L   Potassium 3.3 (*) 3.5 - 5.1 mEq/L   Chloride 101  96 - 112 mEq/L   CO2 30  19 - 32 mEq/L   Glucose, Bld 119 (*) 70 - 99 mg/dL   BUN 11  6 - 23 mg/dL   Creatinine, Ser 9.60  0.50 - 1.10 mg/dL   Calcium 9.9  8.4 - 45.4 mg/dL   Total Protein 7.8  6.0 - 8.3 g/dL   Albumin 3.5  3.5 - 5.2 g/dL   AST 13  0 - 37 U/L   ALT 12  0 - 35 U/L   Alkaline Phosphatase 97  39 - 117 U/L   Total Bilirubin 0.2 (*) 0.3 - 1.2 mg/dL   GFR calc non Af Amer >90  >90 mL/min   GFR calc Af Amer >90  >90 mL/min  PREGNANCY, URINE      Component Value Range   Preg Test, Ur NEGATIVE  NEGATIVE  URINALYSIS, ROUTINE W REFLEX MICROSCOPIC      Component Value Range   Color, Urine YELLOW  YELLOW   APPearance CLEAR  CLEAR   Specific Gravity, Urine 1.026  1.005 - 1.030   pH 6.0  5.0 - 8.0   Glucose, UA NEGATIVE  NEGATIVE mg/dL   Hgb urine dipstick TRACE (*) NEGATIVE   Bilirubin Urine NEGATIVE  NEGATIVE   Ketones, ur NEGATIVE  NEGATIVE mg/dL   Protein, ur NEGATIVE  NEGATIVE mg/dL   Urobilinogen, UA 0.2  0.0 - 1.0 mg/dL   Nitrite NEGATIVE  NEGATIVE   Leukocytes, UA NEGATIVE  NEGATIVE  CBC      Component Value Range   WBC 14.9 (*) 4.0 - 10.5 K/uL   RBC 4.78  3.87 - 5.11 MIL/uL   Hemoglobin 14.2  12.0 - 15.0 g/dL   HCT 09.8  11.9 - 14.7 %   MCV 90.0  78.0 - 100.0 fL   MCH 29.7  26.0 - 34.0 pg   MCHC 33.0  30.0 - 36.0 g/dL   RDW 82.9 (*) 56.2 - 13.0 %   Platelets 331  150 - 400 K/uL  DIFFERENTIAL      Component Value Range   Neutrophils Relative 79 (*) 43 - 77 %   Neutro Abs 11.8 (*) 1.7 - 7.7 K/uL   Lymphocytes Relative 14  12 - 46 %   Lymphs Abs 2.1  0.7 - 4.0 K/uL   Monocytes Relative 5  3 - 12 %   Monocytes Absolute 0.8  0.1 - 1.0 K/uL   Eosinophils Relative 1  0 - 5 %   Eosinophils Absolute 0.2  0.0 - 0.7 K/uL   Basophils Relative 0  0 - 1 %   Basophils Absolute 0.0  0.0 - 0.1 K/uL  URINE  MICROSCOPIC-ADD ON       Component Value Range   Squamous Epithelial / LPF RARE  RARE   RBC / HPF 0-2  <3 RBC/hpf  LIPASE, BLOOD      Component Value Range   Lipase 42  11 - 59 U/L   Ct Abdomen Pelvis W Contrast  11/30/2011  *RADIOLOGY REPORT*  Clinical Data: Abdominal pain  CT ABDOMEN AND PELVIS WITH CONTRAST  Technique:  Multidetector CT imaging of the abdomen and pelvis was performed following the standard protocol during bolus administration of intravenous contrast.  Contrast: OMNIPAQUE IOHEXOL 300 MG/ML  SOLN  Comparison: 07/13/2004  Findings: Limited images through the lung bases demonstrate no significant appreciable abnormality. The heart size is within normal limits. No pleural or pericardial effusion.  Subcentimeter hypodensity within the hepatic dome.  Incompletely characterized.  Unremarkable biliary system, unremarkable biliary system, spleen, pancreas, adrenal glands.  Subcentimeter hypodensity arising from the interpolar left kidney is incompletely characterized.  A couple hypodensities within the right kidney are also incompletely characterized. No hydronephrosis or hydroureter.  No bowel obstruction.  There is a proximal colonic wall thickening and thickening of the terminal ileum.  Appendix within normal limits.  No free intraperitoneal air.  Lobular uterine contour presumably represents fibroids though is incompletely characterized by CT.  Small amount of free fluid within pelvis.  Decompressed bladder.  There is scattered atherosclerotic calcification of the aorta and its branches. No aneurysmal dilatation.  Right pelvic sidewall and bilateral inguinal lymph nodes are enlarged however without interval change.  No acute osseous finding.  IMPRESSION: There is thickening of the proximal colon and terminal ileum. This is nonspecific, with the favored differential including infectious, ischemic, or inflammatory etiologies. Consider colonoscopy follow- up if not yet performed with age recommended screening.   Enlarged right pelvic sidewall and bilateral inguinal lymph nodes, measuring up to 3.2 cm within the right groin.  While not significantly changed from 2006, etiology is indeterminate. Has this ever been biopsied?  Bilateral renal hypodensities are incompletely characterized. Consider non emergent ultrasound follow-up.  Original Report Authenticated By: Waneta Martins, M.D.      Colitis   MDM  Patient here with leukocytosis and RLQ abdominal pain - CT with inflammatory changes in the the distal ileum and colon - likely colitis considering the clinical picture.  She is afebrile and able to tolerate po fluids so I will discharge home with abx and pain control.  I have told her about the lymphadenopathy, she reports no history of biopsy, and will follow up with her PCP about this.         Izola Price [place], [place] 11/30/11 [phone]

## 2011-11-30 NOTE — Discharge Instructions (Signed)
Colitis Colitis is inflammation of the colon. Colitis can be a short-term or long-standing (chronic) illness. Crohn's disease and ulcerative colitis are 2 types of colitis which are chronic. They usually require lifelong treatment. CAUSES  There are many different causes of colitis, including:  Viruses.   Germs (bacteria).   Medicine reactions.  SYMPTOMS   Diarrhea.   Intestinal bleeding.   Pain.   Fever.   Throwing up (vomiting).   Tiredness (fatigue).   Weight loss.   Bowel blockage.  DIAGNOSIS  The diagnosis of colitis is based on examination and stool or blood tests. X-rays, CT scan, and colonoscopy may also be needed. TREATMENT  Treatment may include:  Fluids given through the vein (intravenously).   Bowel rest (nothing to eat or drink for a period of time).   Medicine for pain and diarrhea.   Medicines (antibiotics) that kill germs.   Cortisone medicines.   Surgery.  HOME CARE INSTRUCTIONS   Get plenty of rest.   Drink enough water and fluids to keep your urine clear or pale yellow.   Eat a well-balanced diet.   Call your caregiver for follow-up as recommended.  SEEK IMMEDIATE MEDICAL CARE IF:   You develop chills.   You have an oral temperature above 102 F (38.9 C), not controlled by medicine.   You have extreme weakness, fainting, or dehydration.   You have repeated vomiting.   You develop severe belly (abdominal) pain or are passing bloody or tarry stools.  MAKE SURE YOU:   Understand these instructions.   Will watch your condition.   Will get help right away if you are not doing well or get worse.  Document Released: 07/14/2004 Document Revised: 05/26/2011 Document Reviewed: 10/09/2009 [facility] Patient Information 2012 [place], [place].Diet for Diarrhea, Adult Having frequent, runny stools (diarrhea) has many causes. Diarrhea may be caused or worsened by food or drink. Diarrhea may be relieved by changing your diet. IF YOU ARE NOT  TOLERATING SOLID FOODS:  Drink enough water and fluids to keep your urine clear or pale yellow.   Avoid sugary drinks and sodas as well as milk-based beverages.   Avoid beverages containing caffeine and alcohol.   You may try rehydrating beverages. You can make your own by following this recipe:    tsp table salt.    tsp baking soda.   ? tsp salt substitute (potassium chloride).   1 tbs + 1 tsp sugar.   1 qt water.  As your stools become more solid, you can start eating solid foods. Add foods one at a time. If a certain food causes your diarrhea to get worse, avoid that food and try other foods. A low fiber, low-fat, and lactose-free diet is recommended. Small, frequent meals may be better tolerated.  Starches  Allowed:  White, [place], and pita breads, plain rolls, buns, bagels. Plain muffins, matzo. Soda, saltine, or graham crackers. Pretzels, melba toast, zwieback. Cooked cereals made with water: cornmeal, farina, cream cereals. Dry cereals: refined corn, wheat, rice. Potatoes prepared any way without skins, refined macaroni, spaghetti, noodles, refined rice.   Avoid:  Bread, rolls, or crackers made with whole wheat, multi-grains, rye, bran seeds, nuts, or coconut. Corn tortillas or taco shells. Cereals containing whole grains, multi-grains, bran, coconut, nuts, or raisins. Cooked or dry oatmeal. Coarse wheat cereals, granola. Cereals advertised as "high-fiber." Potato skins. Whole grain pasta, wild or brown rice. Popcorn. Sweet potatoes/yams. Sweet rolls, doughnuts, waffles, pancakes, sweet breads.  Vegetables  Allowed: Strained tomato and vegetable  juices. Most well-cooked and canned vegetables without seeds. Fresh: Tender lettuce, cucumber without the skin, cabbage, spinach, bean sprouts.   Avoid: Fresh, cooked, or canned: Artichokes, baked beans, beet greens, broccoli, Brussels sprouts, corn, kale, legumes, peas, sweet potatoes. Cooked: Green or red cabbage, spinach. Avoid large  servings of any vegetables, because vegetables shrink when cooked, and they contain more fiber per serving than fresh vegetables.  Fruit  Allowed: All fruit juices except prune juice. Cooked or canned: Apricots, applesauce, cantaloupe, cherries, fruit cocktail, grapefruit, grapes, kiwi, mandarin oranges, peaches, pears, plums, watermelon. Fresh: Apples without skin, ripe banana, grapes, cantaloupe, cherries, grapefruit, peaches, oranges, plums. Keep servings limited to  cup or 1 piece.   Avoid: Fresh: Apple with skin, apricots, mango, pears, raspberries, strawberries. Prune juice, stewed or dried prunes. Dried fruits, raisins, dates. Large servings of all fresh fruits.  Meat and Meat Substitutes  Allowed: Ground or well-cooked tender beef, ham, veal, lamb, pork, or poultry. Eggs, plain cheese. Fish, oysters, shrimp, lobster, other seafoods. Liver, organ meats.   Avoid: Tough, fibrous meats with gristle. Peanut butter, smooth or chunky. Cheese, nuts, seeds, legumes, dried peas, beans, lentils.  Milk  Allowed: Yogurt, lactose-free milk, kefir, drinkable yogurt, buttermilk, soy milk.   Avoid: Milk, chocolate milk, beverages made with milk, such as milk shakes.  Soups  Allowed: Bouillon, broth, or soups made from allowed foods. Any strained soup.   Avoid: Soups made from vegetables that are not allowed, cream or milk-based soups.  Desserts and Sweets  Allowed: Sugar-free gelatin, sugar-free frozen ice pops made without sugar alcohol.   Avoid: Plain cakes and cookies, pie made with allowed fruit, pudding, custard, cream pie. Gelatin, fruit, ice, sherbet, frozen ice pops. Ice cream, ice milk without nuts. Plain hard candy, honey, jelly, molasses, syrup, sugar, chocolate syrup, gumdrops, marshmallows.  Fats and Oils  Allowed: Avoid any fats and oils.   Avoid: Seeds, nuts, olives, avocados. Margarine, butter, cream, mayonnaise, salad oils, plain salad dressings made from allowed foods. Plain  gravy, crisp bacon without rind.  Beverages  Allowed: Water, decaffeinated teas, oral rehydration solutions, sugar-free beverages.   Avoid: Fruit juices, caffeinated beverages (coffee, tea, soda or pop), alcohol, sports drinks, or lemon-lime soda or pop.  Condiments  Allowed: Ketchup, mustard, horseradish, vinegar, cream sauce, cheese sauce, cocoa powder. Spices in moderation: allspice, basil, bay leaves, celery powder or leaves, cinnamon, cumin powder, curry powder, ginger, mace, marjoram, onion or garlic powder, oregano, paprika, parsley flakes, ground pepper, rosemary, sage, savory, tarragon, thyme, turmeric.   Avoid: Coconut, honey.  Weight Monitoring: Weigh yourself every day. You should weigh yourself in the morning after you urinate and before you eat breakfast. Wear the same amount of clothing when you weigh yourself. Record your weight daily. Bring your recorded weights to your clinic visits. Tell your caregiver right away if you have gained 3 lb/1.4 kg or more in 1 day, 5 lb/2.3 kg in a week, or whatever amount you were told to report. SEEK IMMEDIATE MEDICAL CARE IF:   You are unable to keep fluids down.   You start to throw up (vomit) or diarrhea keeps coming back (persistent).   Abdominal pain develops, increases, or can be felt in one place (localizes).   You have an oral temperature above 102 F (38.9 C), not controlled by medicine.   Diarrhea contains blood or mucus.   You develop excessive weakness, dizziness, fainting, or extreme thirst.  MAKE SURE YOU:   Understand these instructions.   Will watch your  condition.   Will get help right away if you are not doing well or get worse.  Document Released: 08/27/2003 Document Revised: 05/26/2011 Document Reviewed: 12/18/2008 [facility] Patient Information 2012 [place], [place].

## 2011-11-30 NOTE — ED Provider Notes (Signed)
Medical screening examination/treatment/procedure(s) were performed by non-physician practitioner and as supervising physician I was immediately available for consultation/collaboration.   Rhina Kramme, MD 11/30/11 0758 

## 2012-05-28 ENCOUNTER — Encounter (HOSPITAL_COMMUNITY): Payer: Self-pay

## 2012-05-28 ENCOUNTER — Emergency Department (HOSPITAL_COMMUNITY): Payer: Federal, State, Local not specified - PPO

## 2012-05-28 ENCOUNTER — Emergency Department (HOSPITAL_COMMUNITY)
Admission: EM | Admit: 2012-05-28 | Discharge: 2012-05-28 | Disposition: A | Discharge status: Home / Self Care | Payer: Federal, State, Local not specified - PPO | Attending: Emergency Medicine | Admitting: Emergency Medicine

## 2012-05-28 DIAGNOSIS — R059 Cough, unspecified: Secondary | ICD-10-CM | POA: Insufficient documentation

## 2012-05-28 DIAGNOSIS — R109 Unspecified abdominal pain: Secondary | ICD-10-CM

## 2012-05-28 DIAGNOSIS — H3552 Pigmentary retinal dystrophy: Secondary | ICD-10-CM | POA: Insufficient documentation

## 2012-05-28 DIAGNOSIS — Z3202 Encounter for pregnancy test, result negative: Secondary | ICD-10-CM | POA: Insufficient documentation

## 2012-05-28 DIAGNOSIS — Z79899 Other long term (current) drug therapy: Secondary | ICD-10-CM | POA: Insufficient documentation

## 2012-05-28 DIAGNOSIS — R05 Cough: Secondary | ICD-10-CM | POA: Insufficient documentation

## 2012-05-28 DIAGNOSIS — F17200 Nicotine dependence, unspecified, uncomplicated: Secondary | ICD-10-CM | POA: Insufficient documentation

## 2012-05-28 DIAGNOSIS — I10 Essential (primary) hypertension: Secondary | ICD-10-CM | POA: Insufficient documentation

## 2012-05-28 DIAGNOSIS — K859 Acute pancreatitis without necrosis or infection, unspecified: Secondary | ICD-10-CM

## 2012-05-28 DIAGNOSIS — E7800 Pure hypercholesterolemia, unspecified: Secondary | ICD-10-CM | POA: Insufficient documentation

## 2012-05-28 HISTORY — DX: Pigmentary retinal dystrophy: H35.52

## 2012-05-28 LAB — URINALYSIS, ROUTINE W REFLEX MICROSCOPIC
Bilirubin Urine: NEGATIVE
Glucose, UA: NEGATIVE mg/dL
Hgb urine dipstick: NEGATIVE
Specific Gravity, Urine: 1.018 (ref 1.005–1.030)
pH: 5.5 (ref 5.0–8.0)

## 2012-05-28 LAB — COMPREHENSIVE METABOLIC PANEL
ALT: 13 U/L (ref 0–35)
ALT: 13 U/L (ref 0–35)
AST: 17 U/L (ref 0–37)
Alkaline Phosphatase: 90 U/L (ref 39–117)
CO2: 25 mEq/L (ref 19–32)
Calcium: 9.8 mg/dL (ref 8.4–10.5)
Chloride: 103 mEq/L (ref 96–112)
GFR calc Af Amer: >90 mL/min (ref >90)
GFR calc non Af Amer: >90 mL/min (ref >90)
Glucose, Bld: 117 mg/dL — ABNORMAL HIGH (ref 70–99)
Potassium: 4 mEq/L (ref 3.5–5.1)
Potassium: 4 mEq/L (ref 3.5–5.1)
Sodium: 138 mEq/L (ref 135–145)
Sodium: 138 mEq/L (ref 135–145)
Total Bilirubin: 0.2 mg/dL — ABNORMAL LOW (ref 0.3–1.2)
Total Protein: 7.9 g/dL (ref 6.0–8.3)
Total Protein: 8 g/dL (ref 6.0–8.3)

## 2012-05-28 LAB — CBC WITH DIFFERENTIAL/PLATELET
Basophils Absolute: 0 10*3/uL (ref 0.0–0.1)
Eosinophils Absolute: 0.1 10*3/uL (ref 0.0–0.7)
Eosinophils Relative: 1 % (ref 0–5)
Lymphocytes Relative: 23 % (ref 12–46)
MCH: 29.1 pg (ref 26.0–34.0)
MCV: 88.4 fL (ref 78.0–100.0)
Neutrophils Relative %: 73 % (ref 43–77)
Platelets: 339 10*3/uL (ref 150–400)
RDW: 16.5 % — ABNORMAL HIGH (ref 11.5–15.5)
WBC: 11.7 10*3/uL — ABNORMAL HIGH (ref 4.0–10.5)

## 2012-05-28 MED ORDER — ONDANSETRON HCL 4 MG/2ML IJ SOLN: 4.0000 mg | Freq: Once | INTRAMUSCULAR | Status: DC

## 2012-05-28 MED ORDER — IOHEXOL 300 MG/ML  SOLN
100.0000 mL | Freq: Once | INTRAMUSCULAR | Status: AC | PRN
  Administered 2012-05-28: 100 mL via INTRAVENOUS

## 2012-05-28 MED ORDER — SODIUM CHLORIDE 0.9 % IV BOLUS (SEPSIS)
1000.0000 mL | Freq: Once | INTRAVENOUS | Status: AC
  Administered 2012-05-28: 1000 mL via INTRAVENOUS

## 2012-05-28 MED ORDER — ONDANSETRON 8 MG PO TBDP: 8.0000 mg | ORAL_TABLET | Freq: Three times a day (TID) | ORAL | Status: DC | PRN

## 2012-05-28 MED ORDER — OXYCODONE HCL 5 MG PO CAPS: 5.0000 mg | ORAL_CAPSULE | ORAL | Status: DC | PRN

## 2012-05-28 NOTE — ED Notes (Signed)
Patient c/o mid abdominal pain x 4 days. Patient reports the pain is worse with a deep breath or movement. Patient denies N/V/D or fevers. Patient had a normal BM this AM.

## 2012-05-28 NOTE — ED Provider Notes (Signed)
History     CSN: [id number]  Arrival date & time 05/28/12  1232   First MD Initiated Contact with Patient 05/28/12 1721      Chief Complaint  Patient presents with  . Abdominal Pain    (Consider location/radiation/quality/duration/timing/severity/associated sxs/prior treatment) HPI  Patient complaining of abdominal pain for 4 days with movement, deep breathing, cough sharp.  Normal bowel movement this a.m.  Taking po as usual, no nausea or vomiting.  History of colitis, but states this is not like that.  States she thought she had gas.  No fever, chills, chest pain, uti symptoms,no blood in stool  no menses since mirena removed in April  PMD.  Primary is va.  Patient had oopherectomy on right.    Past Medical History  Diagnosis Date  . Hypertension   . Hypercholesteremia   . Retinitis pigmentosa     Past Surgical History  Procedure Date  . Cataract extraction   . Oophorectomy   . Bone graft hip iliac crest   . Iud removal     No family history on file.  History  Substance Use Topics  . Smoking status: Current Every Day Smoker -- 1.5 packs/day  . Smokeless tobacco: Not on file  . Alcohol Use: No    OB History    Grav Para Term Preterm Abortions TAB SAB Ect Mult Living                  Review of Systems  Constitutional: Negative for fever, chills, activity change, appetite change and unexpected weight change.  HENT: Negative for sore throat, rhinorrhea, neck pain, neck stiffness and sinus pressure.   Eyes: Negative for visual disturbance.  Respiratory: Negative for cough and shortness of breath.   Cardiovascular: Negative for chest pain and leg swelling.  Gastrointestinal: Positive for abdominal pain. Negative for vomiting, diarrhea and blood in stool.  Genitourinary: Negative for dysuria, urgency, frequency, vaginal discharge and difficulty urinating.  Musculoskeletal: Negative for myalgias, arthralgias and gait problem.  Skin: Negative for color change and  rash.  Neurological: Negative for weakness, light-headedness and headaches.  Hematological: Does not bruise/bleed easily.  Psychiatric/Behavioral: Negative for dysphoric mood.    Allergies  Shellfish allergy  Home Medications   Current Outpatient Rx  Name  Route  Sig  Dispense  Refill  . PANTOPRAZOLE SODIUM 40 MG PO TBEC   Oral   Take 40 mg by mouth daily.         . SERTRALINE HCL 100 MG PO TABS   Oral   Take 100 mg by mouth daily.         . TETRACAINE HCL 0.5 % OP SOLN   Both Eyes   Place 1 drop into both eyes once.         Marland Kitchen TIMOLOL HEMIHYDRATE 0.5 % OP SOLN   Both Eyes   Place 1 drop into both eyes 2 (two) times daily.           BP 156/85  Pulse 79  Temp 98.5 F (36.9 C) (Oral)  Resp 16  SpO2 96%  LMP 09/27/2011  Physical Exam  Nursing note and vitals reviewed. Constitutional: She appears well-developed and well-nourished.  HENT:  Head: Normocephalic and atraumatic.  Eyes: Conjunctivae normal and EOM are normal. Pupils are equal, round, and reactive to light.  Neck: Normal range of motion. Neck supple.  Cardiovascular: Normal rate, regular rhythm, normal heart sounds and intact distal pulses.   Pulmonary/Chest: Effort normal and breath sounds  normal.  Abdominal: Soft. Bowel sounds are normal. There is tenderness.       Bilateral lower quadrants  Musculoskeletal: Normal range of motion.  Neurological: She is alert.  Skin: Skin is warm and dry.  Psychiatric: She has a normal mood and affect. Thought content normal.    ED Course  Procedures (including critical care time)  Labs Reviewed  CBC WITH DIFFERENTIAL - Abnormal; Notable for the following:    WBC 11.7 (*)     RDW 16.5 (*)     Neutro Abs 8.5 (*)     All other components within normal limits  COMPREHENSIVE METABOLIC PANEL - Abnormal; Notable for the following:    Glucose, Bld 113 (*)     All other components within normal limits  URINALYSIS, ROUTINE W REFLEX MICROSCOPIC  POCT  PREGNANCY, URINE   No results found.   No diagnosis found.   Results for orders placed during the hospital encounter of 05/28/12  CBC WITH DIFFERENTIAL      Component Value Range   WBC 11.7 (*) 4.0 - 10.5 K/uL   RBC 5.08  3.87 - 5.11 MIL/uL   Hemoglobin 14.8  12.0 - 15.0 g/dL   HCT 01.0  27.2 - 53.6 %   MCV 88.4  78.0 - 100.0 fL   MCH 29.1  26.0 - 34.0 pg   MCHC 33.0  30.0 - 36.0 g/dL   RDW 64.4 (*) 03.4 - 74.2 %   Platelets 339  150 - 400 K/uL   Neutrophils Relative 73  43 - 77 %   Neutro Abs 8.5 (*) 1.7 - 7.7 K/uL   Lymphocytes Relative 23  12 - 46 %   Lymphs Abs 2.7  0.7 - 4.0 K/uL   Monocytes Relative 3  3 - 12 %   Monocytes Absolute 0.4  0.1 - 1.0 K/uL   Eosinophils Relative 1  0 - 5 %   Eosinophils Absolute 0.1  0.0 - 0.7 K/uL   Basophils Relative 0  0 - 1 %   Basophils Absolute 0.0  0.0 - 0.1 K/uL  COMPREHENSIVE METABOLIC PANEL      Component Value Range   Sodium 138  135 - 145 mEq/L   Potassium 4.0  3.5 - 5.1 mEq/L   Chloride 102  96 - 112 mEq/L   CO2 27  19 - 32 mEq/L   Glucose, Bld 113 (*) 70 - 99 mg/dL   BUN 9  6 - 23 mg/dL   Creatinine, Ser 5.95  0.50 - 1.10 mg/dL   Calcium 9.8  8.4 - 63.8 mg/dL   Total Protein 7.9  6.0 - 8.3 g/dL   Albumin 3.7  3.5 - 5.2 g/dL   AST 17  0 - 37 U/L   ALT 13  0 - 35 U/L   Alkaline Phosphatase 94  39 - 117 U/L   Total Bilirubin 0.3  0.3 - 1.2 mg/dL   GFR calc non Af Amer >90  >90 mL/min   GFR calc Af Amer >90  >90 mL/min  URINALYSIS, ROUTINE W REFLEX MICROSCOPIC      Component Value Range   Color, Urine YELLOW  YELLOW   APPearance CLEAR  CLEAR   Specific Gravity, Urine 1.018  1.005 - 1.030   pH 5.5  5.0 - 8.0   Glucose, UA NEGATIVE  NEGATIVE mg/dL   Hgb urine dipstick NEGATIVE  NEGATIVE   Bilirubin Urine NEGATIVE  NEGATIVE   Ketones, ur NEGATIVE  NEGATIVE mg/dL  Protein, ur NEGATIVE  NEGATIVE mg/dL   Urobilinogen, UA 0.2  0.0 - 1.0 mg/dL   Nitrite NEGATIVE  NEGATIVE   Leukocytes, UA NEGATIVE  NEGATIVE  POCT  PREGNANCY, URINE      Component Value Range   Preg Test, Ur NEGATIVE  NEGATIVE  COMPREHENSIVE METABOLIC PANEL      Component Value Range   Sodium 138  135 - 145 mEq/L   Potassium 4.0  3.5 - 5.1 mEq/L   Chloride 103  96 - 112 mEq/L   CO2 25  19 - 32 mEq/L   Glucose, Bld 117 (*) 70 - 99 mg/dL   BUN 9  6 - 23 mg/dL   Creatinine, Ser 1.61  0.50 - 1.10 mg/dL   Calcium 9.8  8.4 - 09.6 mg/dL   Total Protein 8.0  6.0 - 8.3 g/dL   Albumin 3.7  3.5 - 5.2 g/dL   AST 16  0 - 37 U/L   ALT 13  0 - 35 U/L   Alkaline Phosphatase 90  39 - 117 U/L   Total Bilirubin 0.2 (*) 0.3 - 1.2 mg/dL   GFR calc non Af Amer >90  >90 mL/min   GFR calc Af Amer >90  >90 mL/min  LIPASE, BLOOD      Component Value Range   Lipase 70 (*) 11 - 59 U/L   Ct Abdomen Pelvis W Contrast  05/28/2012  *RADIOLOGY REPORT*  Clinical Data: Mid abdominal pain for 4 days.  Pain worse with taking a deep breath.  Hypertension.  Hypercholesterolemia.  CT ABDOMEN AND PELVIS WITH CONTRAST  Technique:  Multidetector CT imaging of the abdomen and pelvis was performed following the standard protocol during bolus administration of intravenous contrast.  Contrast: OMNIPAQUE IOHEXOL 300 MG/ML  SOLN  Comparison: 11/30/2011.  Findings: Interval development of hazy infiltration pancreatic tail region raises possibility of pancreatitis.  No pancreatic masses identified.  No adjacent splenic or renal abnormality.  Splenic vein is patent. No splenic artery abnormality.  Sub centimeter low density lesion dome liver stable.  Fatty infiltration liver.  Liver is enlarged spanning over 22.6 cm.  Small low density renal structures unchanged.  These may be cysts although some are too small to characterize.  No hydronephrosis.  Hyperplasia adrenal glands greater left stable.  No calcified gallstones.  Advanced atherosclerotic type changes with calcified lower abdominal aorta and iliac arteries.  Right inguinal adenopathy.  This is noted dating back to 2006 with  largest lymph node measuring 3.2 x 2.7 cm.  Tumor not excluded.  Uterine fibroids.  Decompressed urinary bladder without gross abnormality.  No bony destructive lesion.  Partially decompressed portions of bowel without extraluminal bowel inflammatory processor free air.  IMPRESSION: Interval development of hazy infiltration pancreatic tail region raises possibility of pancreatitis.  No pancreatic masses identified.  No extraluminal bowel inflammatory process or free air.  Fatty infiltration liver.  Liver is enlarged spanning over 22.6 cm.  Small low density renal structures unchanged.  These may be cysts although some are too small to characterize.  No hydronephrosis.  Hyperplasia adrenal glands greater left stable.  Advanced atherosclerotic type changes with calcified lower abdominal aorta and iliac arteries.  Right inguinal adenopathy.  This is noted dating back to 2006 with largest lymph node measuring 3.2 x 2.7 cm.  Tumor not excluded.  Uterine fibroids.   Original Report Authenticated By: Lacy Duverney, M.D.     Patient denies etoh use.  Lipase is slightly elevated and hazy infiltrate  c.w. Pancreatitis.  Plan pain meds, antiemetics and f/u with gi, however patient has not had nausea or vomiting and pain is not c.w. Pancreatitis.  MDM         Hilario Quarry, MD 05/28/12 2024

## 2012-05-28 NOTE — ED Notes (Signed)
Discharge instructions reviewed w/ pt., verbalizes understanding. Two prescriptions provided at discharge. 

## 2012-06-18 ENCOUNTER — Encounter: Payer: Self-pay | Admitting: Internal Medicine

## 2012-06-22 ENCOUNTER — Ambulatory Visit: Payer: Federal, State, Local not specified - PPO | Admitting: Internal Medicine

## 2012-07-06 ENCOUNTER — Ambulatory Visit: Payer: Federal, State, Local not specified - PPO | Admitting: Internal Medicine

## 2012-07-23 ENCOUNTER — Encounter: Payer: Self-pay | Admitting: Internal Medicine

## 2012-07-25 ENCOUNTER — Encounter: Payer: Self-pay | Admitting: Internal Medicine

## 2012-07-25 ENCOUNTER — Ambulatory Visit (INDEPENDENT_AMBULATORY_CARE_PROVIDER_SITE_OTHER): Payer: Federal, State, Local not specified - PPO | Admitting: Internal Medicine

## 2012-07-25 ENCOUNTER — Other Ambulatory Visit (INDEPENDENT_AMBULATORY_CARE_PROVIDER_SITE_OTHER): Payer: Federal, State, Local not specified - PPO

## 2012-07-25 VITALS — BP 138/62 | HR 80 | Ht 64.0 in | Wt 227.0 lb

## 2012-07-25 DIAGNOSIS — E785 Hyperlipidemia, unspecified: Secondary | ICD-10-CM | POA: Insufficient documentation

## 2012-07-25 DIAGNOSIS — K219 Gastro-esophageal reflux disease without esophagitis: Secondary | ICD-10-CM | POA: Insufficient documentation

## 2012-07-25 DIAGNOSIS — H3552 Pigmentary retinal dystrophy: Secondary | ICD-10-CM

## 2012-07-25 DIAGNOSIS — Z1211 Encounter for screening for malignant neoplasm of colon: Secondary | ICD-10-CM

## 2012-07-25 DIAGNOSIS — R9389 Abnormal findings on diagnostic imaging of other specified body structures: Secondary | ICD-10-CM

## 2012-07-25 DIAGNOSIS — I10 Essential (primary) hypertension: Secondary | ICD-10-CM | POA: Insufficient documentation

## 2012-07-25 DIAGNOSIS — R109 Unspecified abdominal pain: Secondary | ICD-10-CM

## 2012-07-25 DIAGNOSIS — R1010 Upper abdominal pain, unspecified: Secondary | ICD-10-CM

## 2012-07-25 DIAGNOSIS — K859 Acute pancreatitis without necrosis or infection, unspecified: Secondary | ICD-10-CM

## 2012-07-25 LAB — CBC
MCHC: 33.4 g/dL (ref 30.0–36.0)
MCV: 86.8 fl (ref 78.0–100.0)
Platelets: 356 10*3/uL (ref 150.0–400.0)

## 2012-07-25 LAB — COMPREHENSIVE METABOLIC PANEL
AST: 22 U/L (ref 0–37)
Albumin: 3.6 g/dL (ref 3.5–5.2)
Alkaline Phosphatase: 85 U/L (ref 39–117)
BUN: 10 mg/dL (ref 6–23)
Potassium: 4.1 mEq/L (ref 3.5–5.1)

## 2012-07-25 LAB — LIPASE: Lipase: 56 U/L (ref 11.0–59.0)

## 2012-07-25 MED ORDER — PEG-KCL-NACL-NASULF-NA ASC-C 100 G PO SOLR: 1.0000 | Freq: Once | ORAL | Status: DC

## 2012-07-25 NOTE — Patient Instructions (Addendum)
You have been scheduled for a colonoscopy with propofol. Please follow written instructions given to you at your visit today.  Please pick up your prep kit at the pharmacy within the next 1-3 days. If you use inhalers (even only as needed) or a CPAP machine, please bring them with you on the day of your procedure.  You have been scheduled for an MRI at [facility] on 07/27/2012. Your appointment time is 9:00am. Please arrive 15 minutes prior to your appointment time for registration purposes. There is no prep for this test. However, if you have any metal in your body, have a pacemaker or defibrillator, please be sure to let your ordering physician know. This test typically takes 45 minutes to 1 hour to complete. Nothing to eat or drink 4 hours prior to your appointment.   Your physician has requested that you go to the basement for the following lab work before leaving today: CBC, CMP, Lipase, amylase

## 2012-07-25 NOTE — Progress Notes (Signed)
Patient ID: Susan Farley, female   DOB: [birth year], 52 y.o.   MRN: [id number]  SUBJECTIVE: HPI Mrs. Washer is a 52 yo female with PMH of hypertension, hypercholesterolemia, retinitis pigmentosa who is seen on referral from the ER for evaluation of abdominal pain and abnormal GI imaging. The patient is alone today. The patient reports she's had 2 separate and discrete episodes of "sickness" in the past year. The first was in June of 2013 when she developed diffuse, more right-sided abdominal pain along with nausea, vomiting and diarrhea. The onset for this was acute and was associated with fever. She was seen and evaluated in the local emergency room, underwent abdominal CT and was told that she had colitis, likely infectious. She was treated with antibiotics. This improved over a week or 2 and she felt like it totally resolved. Then in December of 2013 she developed mid abdominal pressure and pain which was worse after eating. There was no associated nausea or vomiting. No fever. And diarrhea was not a problem during this illness.  She was again seen in the ER, underwent lab testing and CT scan, and was told she may have had pancreatitis. She was told her gallbladder appeared normal. She does not drink alcohol on a regular basis, and may drink one or 2 times per year. Her last drink was September 2013. She does have a history of reflux disease and reports she went for an upper endoscopy and the [facility] approximately 2 or 3 years ago. She was told she had a hiatal hernia but no ulcer disease. She's been on pantoprazole 40 mg twice daily. This controlled her heartburn very well, but if she misses it she has heartburn.  Today she denies abdominal pain. No nausea or vomiting. She is able to eat, but reports a somewhat decreased appetite. No postprandial pain. Her bowel habits been regular, 1-2 times per day. She describes her stools as soft but formed. No diarrhea. No constipation. She does report  approximately one day per week she'll have loose stools, but no increase in frequency. No rectal bleeding or melena.    No known family history as she was adopted  Review of Systems  As per history of present illness, otherwise negative   Past Medical History  Diagnosis Date  . Hypertension   . Hypercholesteremia   . Retinitis pigmentosa   . Colitis     Current Outpatient Prescriptions  Medication Sig Dispense Refill  . ondansetron (ZOFRAN ODT) 8 MG disintegrating tablet Take 1 tablet (8 mg total) by mouth every 8 (eight) hours as needed for nausea.  20 tablet  0  . oxycodone (OXY-IR) 5 MG capsule Take 1 capsule (5 mg total) by mouth every 4 (four) hours as needed.  10 capsule  0  . pantoprazole (PROTONIX) 40 MG tablet Take 40 mg by mouth 2 (two) times daily.       . sertraline (ZOLOFT) 100 MG tablet Take 100 mg by mouth daily.      Marland Kitchen tetracaine (PONTOCAINE) 0.5 % ophthalmic solution Place 1 drop into both eyes once.      . timolol (BETIMOL) 0.5 % ophthalmic solution Place 1 drop into both eyes 2 (two) times daily.      . peg 3350 powder (MOVIPREP) 100 G SOLR Take 1 kit (100 g total) by mouth once.  1 kit  0    Allergies  Allergen Reactions  . Shellfish Allergy Anaphylaxis    Family History  Problem Relation Age of  Onset  . Adopted: Yes  . Family history unknown: Yes    History  Substance Use Topics  . Smoking status: Current Every Day Smoker -- 1.5 packs/day  . Smokeless tobacco: Never Used  . Alcohol Use: No    OBJECTIVE: BP 138/62  Pulse 80  Ht 5\' 4"  (1.626 m)  Wt 227 lb (102.967 kg)  BMI 38.96 kg/m2  LMP 09/27/2011 Constitutional: Well-developed and well-nourished. No distress.  Smells of cigarettes HEENT: Normocephalic and atraumatic. Oropharynx is clear and moist. No oropharyngeal exudate. Conjunctivae are normal. No scleral icterus. Neck: Neck supple. Trachea midline. Cardiovascular: Normal rate, regular rhythm and intact distal pulses.  Pulmonary/chest:  Effort normal and breath sounds normal. No wheezing, rales or rhonchi. Abdominal: Soft, nontender, nondistended. Bowel sounds active throughout. There are no masses palpable. No hepatosplenomegaly. Extremities: no clubbing, cyanosis, or edema Lymphadenopathy: No cervical adenopathy noted. Neurological: Alert and oriented to person place and time. Skin: Skin is warm and dry. No rashes noted. Psychiatric: Normal mood and affect. Behavior is normal.  Labs and Imaging -- CBC    Component Value Date/Time   WBC 11.7* 05/28/2012 1447   RBC 5.08 05/28/2012 1447   HGB 14.8 05/28/2012 1447   HCT 44.9 05/28/2012 1447   PLT 339 05/28/2012 1447   MCV 88.4 05/28/2012 1447   MCH 29.1 05/28/2012 1447   MCHC 33.0 05/28/2012 1447   RDW 16.5* 05/28/2012 1447   LYMPHSABS 2.7 05/28/2012 1447   MONOABS 0.4 05/28/2012 1447   EOSABS 0.1 05/28/2012 1447   BASOSABS 0.0 05/28/2012 1447    CMP     Component Value Date/Time   NA 138 05/28/2012 1743   K 4.0 05/28/2012 1743   CL 103 05/28/2012 1743   CO2 25 05/28/2012 1743   GLUCOSE 117* 05/28/2012 1743   BUN 9 05/28/2012 1743   CREATININE 0.65 05/28/2012 1743   CALCIUM 9.8 05/28/2012 1743   PROT 8.0 05/28/2012 1743   ALBUMIN 3.7 05/28/2012 1743   AST 16 05/28/2012 1743   ALT 13 05/28/2012 1743   ALKPHOS 90 05/28/2012 1743   BILITOT 0.2* 05/28/2012 1743   GFRNONAA >90 05/28/2012 1743   GFRAA >90 05/28/2012 1743    Lipase     Component Value Date/Time   LIPASE 70* 05/28/2012 1743   CT ABDOMEN AND PELVIS WITH CONTRAST  -- June 2013   Technique:  Multidetector CT imaging of the abdomen and pelvis was performed following the standard protocol during bolus administration of intravenous contrast.   Contrast: OMNIPAQUE IOHEXOL 300 MG/ML  SOLN   Comparison: 07/13/2004   Findings: Limited images through the lung bases demonstrate no significant appreciable abnormality. The heart size is within normal limits. No pleural or pericardial effusion.   Subcentimeter  hypodensity within the hepatic dome.  Incompletely characterized.  Unremarkable biliary system, unremarkable biliary system, spleen, pancreas, adrenal glands.   Subcentimeter hypodensity arising from the interpolar left kidney is incompletely characterized.  A couple hypodensities within the right kidney are also incompletely characterized. No hydronephrosis or hydroureter.   No bowel obstruction.  There is a proximal colonic wall thickening and thickening of the terminal ileum.  Appendix within normal limits.   No free intraperitoneal air.   Lobular uterine contour presumably represents fibroids though is incompletely characterized by CT.  Small amount of free fluid within pelvis.  Decompressed bladder.   There is scattered atherosclerotic calcification of the aorta and its branches. No aneurysmal dilatation.   Right pelvic sidewall and bilateral inguinal lymph nodes  are enlarged however without interval change.   No acute osseous finding.   IMPRESSION: There is thickening of the proximal colon and terminal ileum. This is nonspecific, with the favored differential including infectious, ischemic, or inflammatory etiologies. Consider colonoscopy follow- up if not yet performed with age recommended screening.   Enlarged right pelvic sidewall and bilateral inguinal lymph nodes, measuring up to 3.2 cm within the right groin.  While not significantly changed from 2006, etiology is indeterminate. Has this ever been biopsied?   Bilateral renal hypodensities are incompletely characterized. Consider non emergent ultrasound follow-up.   CT ABDOMEN AND PELVIS WITH CONTRAST -- Dec 2013   Technique:  Multidetector CT imaging of the abdomen and pelvis was performed following the standard protocol during bolus administration of intravenous contrast.   Contrast: OMNIPAQUE IOHEXOL 300 MG/ML  SOLN   Comparison: 11/30/2011.   Findings: Interval development of hazy infiltration  pancreatic tail region raises possibility of pancreatitis.  No pancreatic masses identified.   No adjacent splenic or renal abnormality.  Splenic vein is patent. No splenic artery abnormality.   Sub centimeter low density lesion dome liver stable.  Fatty infiltration liver.  Liver is enlarged spanning over 22.6 cm.   Small low density renal structures unchanged.  These may be cysts although some are too small to characterize.  No hydronephrosis.   Hyperplasia adrenal glands greater left stable.   No calcified gallstones.   Advanced atherosclerotic type changes with calcified lower abdominal aorta and iliac arteries.   Right inguinal adenopathy.  This is noted dating back to 2006 with largest lymph node measuring 3.2 x 2.7 cm.  Tumor not excluded.   Uterine fibroids.  Decompressed urinary bladder without gross abnormality.   No bony destructive lesion.   Partially decompressed portions of bowel without extraluminal bowel inflammatory processor free air.   IMPRESSION: Interval development of hazy infiltration pancreatic tail region raises possibility of pancreatitis.  No pancreatic masses identified.   No extraluminal bowel inflammatory process or free air.   Fatty infiltration liver.  Liver is enlarged spanning over 22.6 cm.   Small low density renal structures unchanged.  These may be cysts although some are too small to characterize.  No hydronephrosis.   Hyperplasia adrenal glands greater left stable.   Advanced atherosclerotic type changes with calcified lower abdominal aorta and iliac arteries.   Right inguinal adenopathy.  This is noted dating back to 2006 with largest lymph node measuring 3.2 x 2.7 cm.  Tumor not excluded.    ASSESSMENT AND PLAN: 52 yo female with PMH of hypertension, hypercholesterolemia, retinitis pigmentosa who is seen on referral from the ER for evaluation of abdominal pain and abnormal GI imaging.  1.  Abd pain/pancreatitis -- the  patient's presentation the hospital in December was very likely mild pancreatitis.  It seems to have resolved completely, but the trigger is unknown. Alcohol does not seem to be a factor and there are no gallstones by CT scan. There also no medications that have a definitive association with pancreatitis. I recommended repeating her cross-sectional imaging with MRI abdomen with contrast plus MRCP.  I will recheck labs today to include CMP, CBC, lipase and amylase. Given that her abdominal pain is improved, I expect that her pancreatic inflammation has as well. MRI will help to exclude subtle pancreatic lesions which may have precipitated the attack.  2.  History of colitis -- her colitis in June 2013 does indeed sound infectious in seem to have resolved completely. Certainly there was  no evidence for colonic inflammation on her repeat CT scan in December. This is reassuring. See #3  3.  CRC screening -- she is 52 years old and never had a screening colonoscopy. I recommended colonoscopy today for this reason. She also has a history of ileocolitis in June, which by symptoms has resolved, but screening colonoscopy we'll also evaluate the right colon and possibly the terminal ileum.  4.  Inguinal adenopathy -- I would like to proceed with repeat imaging in colonoscopy first, but we may need to refer her to general surgery to consider biopsy of her inguinal adenopathy. These are stable over time, which argues for a benign process. We will discuss this further after procedure  5.  GERD -- controlled on pantoprazole, we'll continue at this dose for now.

## 2012-07-26 ENCOUNTER — Other Ambulatory Visit: Payer: Self-pay | Admitting: Internal Medicine

## 2012-07-26 ENCOUNTER — Ambulatory Visit (HOSPITAL_COMMUNITY)
Admission: RE | Admit: 2012-07-26 | Discharge: 2012-07-26 | Disposition: A | Discharge status: Home / Self Care | Payer: Federal, State, Local not specified - PPO | Source: Ambulatory Visit | Attending: Internal Medicine | Admitting: Internal Medicine

## 2012-07-26 DIAGNOSIS — R1011 Right upper quadrant pain: Secondary | ICD-10-CM | POA: Insufficient documentation

## 2012-07-26 DIAGNOSIS — G8929 Other chronic pain: Secondary | ICD-10-CM | POA: Insufficient documentation

## 2012-07-26 DIAGNOSIS — R9389 Abnormal findings on diagnostic imaging of other specified body structures: Secondary | ICD-10-CM

## 2012-07-26 DIAGNOSIS — N281 Cyst of kidney, acquired: Secondary | ICD-10-CM | POA: Insufficient documentation

## 2012-07-26 DIAGNOSIS — K7689 Other specified diseases of liver: Secondary | ICD-10-CM | POA: Insufficient documentation

## 2012-07-26 DIAGNOSIS — R1010 Upper abdominal pain, unspecified: Secondary | ICD-10-CM

## 2012-07-26 DIAGNOSIS — E279 Disorder of adrenal gland, unspecified: Secondary | ICD-10-CM | POA: Insufficient documentation

## 2012-07-26 MED ORDER — GADOBENATE DIMEGLUMINE 529 MG/ML IV SOLN
20.0000 mL | Freq: Once | INTRAVENOUS | Status: AC | PRN
  Administered 2012-07-26: 20 mL via INTRAVENOUS

## 2012-07-27 ENCOUNTER — Ambulatory Visit (HOSPITAL_COMMUNITY): Admission: RE | Admit: 2012-07-27 | Payer: Federal, State, Local not specified - PPO | Source: Ambulatory Visit

## 2012-07-27 ENCOUNTER — Other Ambulatory Visit (HOSPITAL_COMMUNITY): Payer: Federal, State, Local not specified - PPO

## 2012-08-09 ENCOUNTER — Telehealth: Payer: Self-pay | Admitting: Internal Medicine

## 2012-08-09 ENCOUNTER — Encounter: Payer: Federal, State, Local not specified - PPO | Admitting: Internal Medicine

## 2012-08-09 ENCOUNTER — Telehealth: Payer: Self-pay | Admitting: *Deleted

## 2012-08-09 NOTE — Telephone Encounter (Signed)
Thank you. Prep voucher or sample can be provided (Moviprep or Suprep) thanks

## 2012-08-09 NOTE — Telephone Encounter (Signed)
Susan Farley, please check with patient regarding rescheduling colonoscopy. No charge

## 2012-08-09 NOTE — Telephone Encounter (Signed)
Spoke with patient and she stated that she overslept .  Stated that she did not take her second prep at all.  States that she is probably clear, but not sure.  I suggested that she take the other prep now so she can have her procedure this afternoon.  She refused, and stated that she had nausea last night and a headahce.   I asked if she had called the doc on call, and she said, "No."   Patient is aware that she will have to pay $100 for a no show today.   She said to" cancel today," and that she would call and "reschedule."   I did suggest that she come in and she could do enemas.  She refused that too.

## 2012-08-09 NOTE — Telephone Encounter (Signed)
Patient cx procedure will c/b to reschedule with Aram Beecham or Kennyth Arnold

## 2012-08-09 NOTE — Telephone Encounter (Signed)
Spoke with pt about the incident this am. She states she drank all of the 1st prep last night and was drinking ginger ale and soup broth and suddenly started shaking, felt as though her throat was closing and developed a migraine; she felt feverish also. This went on for several hours and she didn't know if she could take anything and finally decided to take IBUPROFEN. She finally went to sleep sitting up in a chair and woke at 10am and called in. She states she had every intention of coming today, but felt she may not have enough time to prep correctly and possible accidents on the way here.  Apologized to pt for any misunderstanding and she stated understanding. She will call back in to r/s her procedure and when has the money for another prep. Explained to pt we will give her a voucher for her prep because of her trouble. If will be Dr Lauro Franklin decision on maybe changing to another prep; pt stated understanding and will call me or Adonis Housekeeper, CMA to schedule.

## 2012-08-09 NOTE — Telephone Encounter (Signed)
lmom for pt to call back

## 2012-08-09 NOTE — Telephone Encounter (Signed)
Spoke with patient again.  She stated that she had " a migraine" last night, and that's why she got nauseated.  Again, I asked her why she didn't call the doc on call.  No comment from patient.  She did not reschedule today.  Stated that she will reschedule on March 1st.   She is aware about the $100 fee.   She stated that she "probably didn't drink enough, and that's why she got a headache.

## 2012-09-17 NOTE — Telephone Encounter (Signed)
See previous note

## 2013-11-25 ENCOUNTER — Ambulatory Visit: Payer: Non-veteran care

## 2013-12-09 ENCOUNTER — Ambulatory Visit: Payer: Non-veteran care | Attending: Family | Admitting: Physical Therapy

## 2013-12-09 DIAGNOSIS — M25659 Stiffness of unspecified hip, not elsewhere classified: Secondary | ICD-10-CM | POA: Insufficient documentation

## 2013-12-09 DIAGNOSIS — IMO0001 Reserved for inherently not codable concepts without codable children: Secondary | ICD-10-CM | POA: Insufficient documentation

## 2013-12-09 DIAGNOSIS — M545 Low back pain, unspecified: Secondary | ICD-10-CM | POA: Insufficient documentation

## 2013-12-12 ENCOUNTER — Encounter: Payer: Non-veteran care | Admitting: Physical Therapy

## 2013-12-26 ENCOUNTER — Ambulatory Visit: Payer: Non-veteran care | Attending: Family | Admitting: Physical Therapy

## 2013-12-26 DIAGNOSIS — M545 Low back pain, unspecified: Secondary | ICD-10-CM | POA: Insufficient documentation

## 2013-12-26 DIAGNOSIS — M25659 Stiffness of unspecified hip, not elsewhere classified: Secondary | ICD-10-CM | POA: Insufficient documentation

## 2013-12-26 DIAGNOSIS — IMO0001 Reserved for inherently not codable concepts without codable children: Secondary | ICD-10-CM | POA: Insufficient documentation

## 2013-12-30 ENCOUNTER — Ambulatory Visit: Payer: Non-veteran care | Admitting: Physical Therapy

## 2014-01-07 ENCOUNTER — Ambulatory Visit: Payer: Non-veteran care | Admitting: Physical Therapy

## 2014-01-08 ENCOUNTER — Ambulatory Visit: Payer: Non-veteran care | Admitting: Physical Therapy

## 2014-01-13 ENCOUNTER — Ambulatory Visit: Payer: Non-veteran care | Admitting: Physical Therapy

## 2014-01-15 ENCOUNTER — Ambulatory Visit: Payer: Non-veteran care | Admitting: Physical Therapy

## 2014-01-21 ENCOUNTER — Ambulatory Visit: Payer: Non-veteran care | Admitting: Rehabilitation

## 2014-01-22 ENCOUNTER — Ambulatory Visit: Payer: Non-veteran care | Admitting: Rehabilitation

## 2014-01-28 ENCOUNTER — Ambulatory Visit: Payer: Non-veteran care | Attending: Family | Admitting: Physical Therapy

## 2014-01-28 DIAGNOSIS — M25659 Stiffness of unspecified hip, not elsewhere classified: Secondary | ICD-10-CM | POA: Diagnosis not present

## 2014-01-28 DIAGNOSIS — IMO0001 Reserved for inherently not codable concepts without codable children: Secondary | ICD-10-CM | POA: Insufficient documentation

## 2014-01-28 DIAGNOSIS — M545 Low back pain, unspecified: Secondary | ICD-10-CM | POA: Diagnosis not present

## 2014-01-30 ENCOUNTER — Ambulatory Visit: Payer: Non-veteran care | Admitting: Physical Therapy

## 2014-01-30 DIAGNOSIS — IMO0001 Reserved for inherently not codable concepts without codable children: Secondary | ICD-10-CM | POA: Diagnosis not present

## 2014-02-04 ENCOUNTER — Ambulatory Visit: Payer: Non-veteran care | Admitting: Physical Therapy

## 2014-02-04 DIAGNOSIS — IMO0001 Reserved for inherently not codable concepts without codable children: Secondary | ICD-10-CM | POA: Diagnosis not present

## 2014-02-06 ENCOUNTER — Ambulatory Visit: Payer: Non-veteran care | Admitting: Physical Therapy

## 2014-02-11 ENCOUNTER — Ambulatory Visit: Payer: Non-veteran care

## 2014-02-13 ENCOUNTER — Ambulatory Visit: Payer: Non-veteran care

## 2014-04-20 DIAGNOSIS — I639 Cerebral infarction, unspecified: Secondary | ICD-10-CM

## 2014-04-20 HISTORY — DX: Cerebral infarction, unspecified: I63.9

## 2014-04-24 ENCOUNTER — Encounter (HOSPITAL_COMMUNITY): Payer: Self-pay | Admitting: *Deleted

## 2014-04-24 ENCOUNTER — Inpatient Hospital Stay (HOSPITAL_COMMUNITY)
Admission: EM | Admit: 2014-04-24 | Discharge: 2014-04-25 | DRG: 065 | Disposition: A | Discharge status: Home / Self Care | Payer: Federal, State, Local not specified - PPO | Attending: Internal Medicine | Admitting: Internal Medicine

## 2014-04-24 ENCOUNTER — Emergency Department (HOSPITAL_COMMUNITY): Payer: Federal, State, Local not specified - PPO

## 2014-04-24 ENCOUNTER — Inpatient Hospital Stay (HOSPITAL_COMMUNITY): Payer: Federal, State, Local not specified - PPO

## 2014-04-24 DIAGNOSIS — F17210 Nicotine dependence, cigarettes, uncomplicated: Secondary | ICD-10-CM | POA: Diagnosis present

## 2014-04-24 DIAGNOSIS — Z7982 Long term (current) use of aspirin: Secondary | ICD-10-CM | POA: Diagnosis not present

## 2014-04-24 DIAGNOSIS — K219 Gastro-esophageal reflux disease without esophagitis: Secondary | ICD-10-CM | POA: Diagnosis present

## 2014-04-24 DIAGNOSIS — E1159 Type 2 diabetes mellitus with other circulatory complications: Secondary | ICD-10-CM | POA: Insufficient documentation

## 2014-04-24 DIAGNOSIS — Z6841 Body Mass Index (BMI) 40.0 and over, adult: Secondary | ICD-10-CM | POA: Diagnosis not present

## 2014-04-24 DIAGNOSIS — Z9849 Cataract extraction status, unspecified eye: Secondary | ICD-10-CM | POA: Diagnosis not present

## 2014-04-24 DIAGNOSIS — E780 Pure hypercholesterolemia: Secondary | ICD-10-CM | POA: Diagnosis present

## 2014-04-24 DIAGNOSIS — I10 Essential (primary) hypertension: Secondary | ICD-10-CM | POA: Diagnosis present

## 2014-04-24 DIAGNOSIS — G4730 Sleep apnea, unspecified: Secondary | ICD-10-CM | POA: Diagnosis present

## 2014-04-24 DIAGNOSIS — E876 Hypokalemia: Secondary | ICD-10-CM | POA: Diagnosis present

## 2014-04-24 DIAGNOSIS — D72829 Elevated white blood cell count, unspecified: Secondary | ICD-10-CM | POA: Diagnosis present

## 2014-04-24 DIAGNOSIS — G4733 Obstructive sleep apnea (adult) (pediatric): Secondary | ICD-10-CM | POA: Diagnosis present

## 2014-04-24 DIAGNOSIS — E1165 Type 2 diabetes mellitus with hyperglycemia: Secondary | ICD-10-CM | POA: Diagnosis present

## 2014-04-24 DIAGNOSIS — R200 Anesthesia of skin: Secondary | ICD-10-CM | POA: Diagnosis not present

## 2014-04-24 DIAGNOSIS — Z79899 Other long term (current) drug therapy: Secondary | ICD-10-CM | POA: Diagnosis not present

## 2014-04-24 DIAGNOSIS — I6322 Cerebral infarction due to unspecified occlusion or stenosis of basilar arteries: Secondary | ICD-10-CM

## 2014-04-24 DIAGNOSIS — Z91013 Allergy to seafood: Secondary | ICD-10-CM

## 2014-04-24 DIAGNOSIS — E785 Hyperlipidemia, unspecified: Secondary | ICD-10-CM | POA: Diagnosis present

## 2014-04-24 DIAGNOSIS — I63219 Cerebral infarction due to unspecified occlusion or stenosis of unspecified vertebral arteries: Secondary | ICD-10-CM | POA: Diagnosis present

## 2014-04-24 DIAGNOSIS — I6381 Other cerebral infarction due to occlusion or stenosis of small artery: Secondary | ICD-10-CM | POA: Diagnosis present

## 2014-04-24 DIAGNOSIS — I639 Cerebral infarction, unspecified: Principal | ICD-10-CM | POA: Diagnosis present

## 2014-04-24 DIAGNOSIS — H3552 Pigmentary retinal dystrophy: Secondary | ICD-10-CM

## 2014-04-24 DIAGNOSIS — F329 Major depressive disorder, single episode, unspecified: Secondary | ICD-10-CM | POA: Diagnosis present

## 2014-04-24 DIAGNOSIS — R739 Hyperglycemia, unspecified: Secondary | ICD-10-CM | POA: Diagnosis present

## 2014-04-24 LAB — COMPREHENSIVE METABOLIC PANEL
ALBUMIN: 3.9 g/dL (ref 3.5–5.2)
ALT: 26 U/L (ref 0–35)
AST: 20 U/L (ref 0–37)
Alkaline Phosphatase: 135 U/L — ABNORMAL HIGH (ref 39–117)
Anion gap: 14 (ref 5–15)
BILIRUBIN TOTAL: 0.3 mg/dL (ref 0.3–1.2)
BUN: 13 mg/dL (ref 6–23)
CO2: 32 mEq/L (ref 19–32)
CREATININE: 0.68 mg/dL (ref 0.50–1.10)
Calcium: 10.5 mg/dL (ref 8.4–10.5)
Chloride: 93 mEq/L — ABNORMAL LOW (ref 96–112)
GFR calc Af Amer: >90 mL/min (ref >90)
GFR calc non Af Amer: >90 mL/min (ref >90)
Glucose, Bld: 205 mg/dL — ABNORMAL HIGH (ref 70–99)
Potassium: 3.5 mEq/L — ABNORMAL LOW (ref 3.7–5.3)
Sodium: 139 mEq/L (ref 137–147)
Total Protein: 8.3 g/dL (ref 6.0–8.3)

## 2014-04-24 LAB — DIFFERENTIAL
BASOS ABS: 0 10*3/uL (ref 0.0–0.1)
BASOS PCT: 0 % (ref 0–1)
Eosinophils Absolute: 0.2 10*3/uL (ref 0.0–0.7)
Eosinophils Relative: 1 % (ref 0–5)
LYMPHS PCT: 23 % (ref 12–46)
Lymphs Abs: 3.2 10*3/uL (ref 0.7–4.0)
MONO ABS: 0.6 10*3/uL (ref 0.1–1.0)
Monocytes Relative: 5 % (ref 3–12)
NEUTROS PCT: 71 % (ref 43–77)
Neutro Abs: 9.6 10*3/uL — ABNORMAL HIGH (ref 1.7–7.7)

## 2014-04-24 LAB — I-STAT CHEM 8, ED
BUN: 13 mg/dL (ref 6–23)
CHLORIDE: 93 meq/L — AB (ref 96–112)
Calcium, Ion: 1.24 mmol/L — ABNORMAL HIGH (ref 1.12–1.23)
Creatinine, Ser: 0.8 mg/dL (ref 0.50–1.10)
GLUCOSE: 214 mg/dL — AB (ref 70–99)
HCT: 50 % — ABNORMAL HIGH (ref 36.0–46.0)
HEMOGLOBIN: 17 g/dL — AB (ref 12.0–15.0)
POTASSIUM: 3.4 meq/L — AB (ref 3.7–5.3)
Sodium: 139 mEq/L (ref 137–147)
TCO2: 30 mmol/L (ref 0–100)

## 2014-04-24 LAB — GLUCOSE, CAPILLARY: GLUCOSE-CAPILLARY: 218 mg/dL — AB (ref 70–99)

## 2014-04-24 LAB — CBC
HEMATOCRIT: 46.6 % — AB (ref 36.0–46.0)
Hemoglobin: 15.2 g/dL — ABNORMAL HIGH (ref 12.0–15.0)
MCH: 30 pg (ref 26.0–34.0)
MCHC: 32.6 g/dL (ref 30.0–36.0)
MCV: 91.9 fL (ref 78.0–100.0)
PLATELETS: 321 10*3/uL (ref 150–400)
RBC: 5.07 MIL/uL (ref 3.87–5.11)
RDW: 15.6 % — AB (ref 11.5–15.5)
WBC: 13.6 10*3/uL — AB (ref 4.0–10.5)

## 2014-04-24 LAB — RAPID URINE DRUG SCREEN, HOSP PERFORMED
AMPHETAMINES: NOT DETECTED
Barbiturates: NOT DETECTED
Benzodiazepines: NOT DETECTED
COCAINE: NOT DETECTED
Opiates: NOT DETECTED
Tetrahydrocannabinol: NOT DETECTED

## 2014-04-24 LAB — URINALYSIS, ROUTINE W REFLEX MICROSCOPIC
BILIRUBIN URINE: NEGATIVE
GLUCOSE, UA: NEGATIVE mg/dL
Hgb urine dipstick: NEGATIVE
KETONES UR: NEGATIVE mg/dL
Leukocytes, UA: NEGATIVE
Nitrite: NEGATIVE
PROTEIN: NEGATIVE mg/dL
Specific Gravity, Urine: 1.022 (ref 1.005–1.030)
Urobilinogen, UA: 0.2 mg/dL (ref 0.0–1.0)
pH: 5 (ref 5.0–8.0)

## 2014-04-24 LAB — I-STAT TROPONIN, ED: TROPONIN I, POC: 0 ng/mL (ref 0.00–0.08)

## 2014-04-24 LAB — ETHANOL: Alcohol, Ethyl (B): <11 mg/dL (ref 0–11)

## 2014-04-24 LAB — PROTIME-INR
INR: 0.95 (ref 0.00–1.49)
PROTHROMBIN TIME: 12.7 s (ref 11.6–15.2)

## 2014-04-24 LAB — APTT: APTT: 28 s (ref 24–37)

## 2014-04-24 MED ORDER — TIMOLOL HEMIHYDRATE 0.5 % OP SOLN
1.0000 [drp] | Freq: Two times a day (BID) | OPHTHALMIC | Status: DC
  Administered 2014-04-25: 1 [drp] via OPHTHALMIC
  Filled 2014-04-24: qty 5

## 2014-04-24 MED ORDER — STROKE: EARLY STAGES OF RECOVERY BOOK
Freq: Once | Status: AC
  Administered 2014-04-25
  Filled 2014-04-24 (×2): qty 1

## 2014-04-24 MED ORDER — PANTOPRAZOLE SODIUM 40 MG PO TBEC
40.0000 mg | DELAYED_RELEASE_TABLET | Freq: Every day | ORAL | Status: DC
  Administered 2014-04-24 – 2014-04-25 (×2): 40 mg via ORAL
  Filled 2014-04-24 (×2): qty 1

## 2014-04-24 MED ORDER — QUETIAPINE FUMARATE ER 50 MG PO TB24: 25.0000 mg | ORAL_TABLET | Freq: Every day | ORAL | Status: DC

## 2014-04-24 MED ORDER — ONDANSETRON 8 MG PO TBDP
8.0000 mg | ORAL_TABLET | Freq: Three times a day (TID) | ORAL | Status: DC | PRN
  Filled 2014-04-24: qty 1

## 2014-04-24 MED ORDER — ASPIRIN EC 325 MG PO TBEC
325.0000 mg | DELAYED_RELEASE_TABLET | Freq: Every day | ORAL | Status: DC
  Administered 2014-04-24 – 2014-04-25 (×2): 325 mg via ORAL
  Filled 2014-04-24 (×2): qty 1

## 2014-04-24 MED ORDER — INSULIN ASPART 100 UNIT/ML ~~LOC~~ SOLN
0.0000 [IU] | Freq: Three times a day (TID) | SUBCUTANEOUS | Status: DC
  Administered 2014-04-25: 3 [IU] via SUBCUTANEOUS
  Administered 2014-04-25: 2 [IU] via SUBCUTANEOUS

## 2014-04-24 MED ORDER — POTASSIUM CHLORIDE CRYS ER 20 MEQ PO TBCR
40.0000 meq | EXTENDED_RELEASE_TABLET | Freq: Once | ORAL | Status: AC
  Administered 2014-04-24: 40 meq via ORAL
  Filled 2014-04-24: qty 2

## 2014-04-24 MED ORDER — NICOTINE 21 MG/24HR TD PT24
21.0000 mg | MEDICATED_PATCH | Freq: Every day | TRANSDERMAL | Status: DC
  Administered 2014-04-24 – 2014-04-25 (×2): 21 mg via TRANSDERMAL
  Filled 2014-04-24 (×2): qty 1

## 2014-04-24 MED ORDER — SERTRALINE HCL 100 MG PO TABS
100.0000 mg | ORAL_TABLET | Freq: Every day | ORAL | Status: DC
  Administered 2014-04-24 – 2014-04-25 (×2): 100 mg via ORAL
  Filled 2014-04-24: qty 2
  Filled 2014-04-24: qty 1

## 2014-04-24 MED ORDER — OXYCODONE HCL 5 MG PO CAPS
5.0000 mg | ORAL_CAPSULE | ORAL | Status: DC | PRN
  Filled 2014-04-24: qty 1

## 2014-04-24 MED ORDER — OXYCODONE HCL 5 MG PO TABS: 5.0000 mg | ORAL_TABLET | ORAL | Status: DC | PRN

## 2014-04-24 MED ORDER — SENNOSIDES-DOCUSATE SODIUM 8.6-50 MG PO TABS
1.0000 | ORAL_TABLET | Freq: Every evening | ORAL | Status: DC | PRN
  Filled 2014-04-24: qty 1

## 2014-04-24 MED ORDER — TETRACAINE HCL 0.5 % OP SOLN
1.0000 [drp] | Freq: Once | OPHTHALMIC | Status: DC
  Filled 2014-04-24: qty 2

## 2014-04-24 MED ORDER — ENOXAPARIN SODIUM 40 MG/0.4ML ~~LOC~~ SOLN
40.0000 mg | SUBCUTANEOUS | Status: DC
  Administered 2014-04-25: 40 mg via SUBCUTANEOUS
  Filled 2014-04-24 (×3): qty 0.4

## 2014-04-24 NOTE — Progress Notes (Signed)
RT spoke with patient about wearing Bipap and the patient stated that she didn't know her pressures and she didn't want to wear the BiPap tonight. RT advised patient that if she changes her mind or if needed that will bring the machine back.  Also advised the RN .

## 2014-04-24 NOTE — Plan of Care (Signed)
Problem: Consults Goal: Skin Care Protocol Initiated - if Braden Score 18 or less If consults are not indicated, leave blank or document N/A Outcome: Completed/Met Date Met:  04/24/14

## 2014-04-24 NOTE — ED Notes (Addendum)
Pt in c/o right sided numbness for the last week, states the only changes to this in the last week are that the numbness in her right leg improved, other symptoms have been constant, denies pain, no distress noted, no weakness noted in triage

## 2014-04-24 NOTE — H&P (Signed)
[facility] History and Physical  Susan Farley OTL:[id number] DOB: 12/30/1960 DOA: 04/24/2014  Referring physician: Dr. Cyril Mourning Ward PCP: No primary care provider on file.  Specialists:   Chief Complaint: Right sided numbness  HPI: Susan Farley is a 53 y.o. female  With a history of retinitis pigmentosa, hypertension, sleep apnea that presented to the emergency department with complaints of right-sided numbness. Patient stated she received her flu vaccination approximately 5 days ago, and shortly thereafter, the numbness began. The numbness is described to be on the right side of the face, neck, back, shoulder, arm. However the numbness is patchy not involving whole limbs. Patient is also had numbness of the right anterior thigh. Upon arrival to the emergency department, neurology was consulted and recommended MRI which did show a small left thalamic infarct. [facility] was called for admission.  Review of Systems:  Constitutional: Denies fever, chills, diaphoresis, appetite change and fatigue.  HEENT: Denies photophobia, eye pain, redness, hearing loss, ear pain, congestion, sore throat, rhinorrhea, sneezing, mouth sores, trouble swallowing, neck pain, neck stiffness and tinnitus.   Respiratory: Denies SOB, DOE, cough, chest tightness,  and wheezing.   Cardiovascular: Denies chest pain, palpitations and leg swelling.  Gastrointestinal: Denies nausea, vomiting, abdominal pain, diarrhea, constipation, blood in stool and abdominal distention.  Genitourinary: Denies dysuria, urgency, frequency, hematuria, flank pain and difficulty urinating.  Musculoskeletal: Denies myalgias, back pain, joint swelling, arthralgias and gait problem.  Skin: Denies pallor, rash and wound.  Neurological: complained of patchy numbness on the right side of the face, arm, back, neck Hematological: Denies adenopathy. Easy bruising, personal or family bleeding history  Psychiatric/Behavioral: Denies suicidal  ideation, mood changes, confusion, nervousness, sleep disturbance and agitation  Past Medical History  Diagnosis Date  . Hypertension   . Hypercholesteremia   . Retinitis pigmentosa   . Colitis    Past Surgical History  Procedure Laterality Date  . Cataract extraction    . Oophorectomy    . Bone graft hip iliac crest    . Iud removal     Social History:  reports that she has been smoking.  She has never used smokeless tobacco. She reports that she does not drink alcohol or use illicit drugs.   Allergies  Allergen Reactions  . Shellfish Allergy Anaphylaxis    Family History  Problem Relation Age of Onset  . Adopted: Yes  . Family history unknown: Yes    Prior to Admission medications   Medication Sig Start Date End Date Taking? Authorizing Provider  dorzolamide (TRUSOPT) 2 % ophthalmic solution Place 1 drop into both eyes 2 (two) times daily.   Yes Historical Provider, MD  hydrochlorothiazide (HYDRODIURIL) 25 MG tablet Take 25 mg by mouth daily.   Yes Historical Provider, MD  pantoprazole (PROTONIX) 40 MG tablet Take 40 mg by mouth daily.    Yes Historical Provider, MD  QUETIAPINE FUMARATE PO Take 0.5 tablets by mouth daily.   Yes Historical Provider, MD  timolol (BETIMOL) 0.5 % ophthalmic solution Place 1 drop into both eyes 2 (two) times daily.   Yes Historical Provider, MD  Vitamin D, Ergocalciferol, (DRISDOL) 50000 UNITS CAPS capsule Take 50,000 Units by mouth every 7 (seven) days.   Yes Historical Provider, MD  ondansetron (ZOFRAN ODT) 8 MG disintegrating tablet Take 1 tablet (8 mg total) by mouth every 8 (eight) hours as needed for nausea. 05/28/12   Shaune Pollack, MD  oxycodone (OXY-IR) 5 MG capsule Take 1 capsule (5 mg total) by mouth  every 4 (four) hours as needed. 05/28/12   Shaune Pollack, MD  peg 3350 powder (MOVIPREP) 100 G SOLR Take 1 kit (100 g total) by mouth once. 07/25/12   Jerene Bears, MD  sertraline (ZOLOFT) 100 MG tablet Take 100 mg by mouth daily.     Historical Provider, MD  tetracaine (PONTOCAINE) 0.5 % ophthalmic solution Place 1 drop into both eyes once.    Historical Provider, MD   Physical Exam: Filed Vitals:   04/24/14 1648  BP: 149/68  Pulse: 93  Temp:   Resp:      General: Well developed, well nourished, NAD, appears stated age  HEENT: NCAT, PERRLA, EOMI, Anicteic Sclera, mucous membranes moist.   Neck: Supple, no JVD, no masses  Cardiovascular: S1 S2 auscultated, 2/6 SEM, Regular rate and rhythm.  Respiratory: Clear to auscultation bilaterally with equal chest rise  Abdomen: Soft, obese, nontender, nondistended, + bowel sounds  Extremities: warm dry without cyanosis clubbing or edema  Neuro: AAOx3, cranial nerves grossly intact. Strength 5/5 in patient's upper and lower extremities bilaterally, intact sensation  Skin: Without rashes exudates or nodules  Psych: anxious mood and affect  Labs on Admission:  Basic Metabolic Panel:  Recent Labs Lab 04/24/14 1128 04/24/14 1137  NA 139 139  K 3.5* 3.4*  CL 93* 93*  CO2 32  --   GLUCOSE 205* 214*  BUN 13 13  CREATININE 0.68 0.80  CALCIUM 10.5  --    Liver Function Tests:  Recent Labs Lab 04/24/14 1128  AST 20  ALT 26  ALKPHOS 135*  BILITOT 0.3  PROT 8.3  ALBUMIN 3.9   No results for input(s): LIPASE, AMYLASE in the last 168 hours. No results for input(s): AMMONIA in the last 168 hours. CBC:  Recent Labs Lab 04/24/14 1128 04/24/14 1137  WBC 13.6*  --   NEUTROABS 9.6*  --   HGB 15.2* 17.0*  HCT 46.6* 50.0*  MCV 91.9  --   PLT 321  --    Cardiac Enzymes: No results for input(s): CKTOTAL, CKMB, CKMBINDEX, TROPONINI in the last 168 hours.  BNP (last 3 results) No results for input(s): PROBNP in the last 8760 hours. CBG: No results for input(s): GLUCAP in the last 168 hours.  Radiological Exams on Admission: Mr Virgel Paling Wo Contrast  04/24/2014   CLINICAL DATA:  Right-sided numbness. Progressive symptoms over the last several days.  Symptoms began after the a flu vaccine.  EXAM: MRI HEAD WITHOUT CONTRAST  MRA HEAD WITHOUT CONTRAST  TECHNIQUE: Multiplanar, multiecho pulse sequences of the brain and surrounding structures were obtained without intravenous contrast. Angiographic images of the head were obtained using MRA technique without contrast.  COMPARISON:  None.  FINDINGS: MRI HEAD FINDINGS  An acute nonhemorrhagic infarct is present within the left thalamus measuring 10 mm maximally. The study is mildly degraded by patient motion. Periventricular white matter changes are greater than expected for age. Scattered subcortical T2 hyperintensities are present as well.  The ventricles are of normal size. No significant extraaxial fluid collection is present. No acute hemorrhage or mass lesion is present. Midline structures are within normal limits.  Flow is present in the major intracranial arteries. The patient is status post bilateral lens extractions. The globes and orbits are otherwise intact.  There is diffuse opacification of the left nasal cavity there despite this, the a left ethmoid and maxillary sinus are clear. The left frontal sinus is opacified. The remaining paranasal sinuses and the mastoid air cells  are clear.  MRA HEAD FINDINGS  Atherosclerotic irregularity is present within the cavernous internal carotid arteries bilaterally. There is no significant stenosis. The A1 and M1 segments are normal. The left A1 is dominant. The anterior communicating artery is patent. The MCA bifurcations are intact bilaterally. Distal small vessel attenuation is evident bilaterally, left greater than right. There is no significant proximal occlusion.  The vertebral arteries are codominant. The right PICA origin is just below the field of view. The left AICA is dominant. There moderate stenoses in the proximal posterior cerebral arteries bilaterally. The basilar artery demonstrates mild atherosclerotic irregularity without significant stenosis. There  is significant attenuation of distal PCA branch vessels bilaterally.  IMPRESSION: 1. Acute nonhemorrhagic infarct within the left thalamus. 2. Periventricular and subcortical white matter changes are somewhat advanced for age. This likely reflects the sequelae of chronic microvascular ischemia. 3. Soft tissue mass or polyp within the left nasal cavity. Recommend direct visualization 4. Obstruction of the left frontal sinus may be related to the nasal mass. 5. Mild to moderate medium and small vessel disease bilaterally, most evident in the left MCA distribution and bilateral posterior cerebral arteries. 6. Moderate stenoses of the proximal posterior cerebral arteries bilaterally. 7. No other significant proximal stenosis, aneurysm, or branch vessel occlusion.   Electronically Signed   By: Lawrence Santiago M.D.   On: 04/24/2014 16:50   Mr Brain Wo Contrast  04/24/2014   CLINICAL DATA:  Right-sided numbness. Progressive symptoms over the last several days. Symptoms began after the a flu vaccine.  EXAM: MRI HEAD WITHOUT CONTRAST  MRA HEAD WITHOUT CONTRAST  TECHNIQUE: Multiplanar, multiecho pulse sequences of the brain and surrounding structures were obtained without intravenous contrast. Angiographic images of the head were obtained using MRA technique without contrast.  COMPARISON:  None.  FINDINGS: MRI HEAD FINDINGS  An acute nonhemorrhagic infarct is present within the left thalamus measuring 10 mm maximally. The study is mildly degraded by patient motion. Periventricular white matter changes are greater than expected for age. Scattered subcortical T2 hyperintensities are present as well.  The ventricles are of normal size. No significant extraaxial fluid collection is present. No acute hemorrhage or mass lesion is present. Midline structures are within normal limits.  Flow is present in the major intracranial arteries. The patient is status post bilateral lens extractions. The globes and orbits are otherwise  intact.  There is diffuse opacification of the left nasal cavity there despite this, the a left ethmoid and maxillary sinus are clear. The left frontal sinus is opacified. The remaining paranasal sinuses and the mastoid air cells are clear.  MRA HEAD FINDINGS  Atherosclerotic irregularity is present within the cavernous internal carotid arteries bilaterally. There is no significant stenosis. The A1 and M1 segments are normal. The left A1 is dominant. The anterior communicating artery is patent. The MCA bifurcations are intact bilaterally. Distal small vessel attenuation is evident bilaterally, left greater than right. There is no significant proximal occlusion.  The vertebral arteries are codominant. The right PICA origin is just below the field of view. The left AICA is dominant. There moderate stenoses in the proximal posterior cerebral arteries bilaterally. The basilar artery demonstrates mild atherosclerotic irregularity without significant stenosis. There is significant attenuation of distal PCA branch vessels bilaterally.  IMPRESSION: 1. Acute nonhemorrhagic infarct within the left thalamus. 2. Periventricular and subcortical white matter changes are somewhat advanced for age. This likely reflects the sequelae of chronic microvascular ischemia. 3. Soft tissue mass or polyp within the left  nasal cavity. Recommend direct visualization 4. Obstruction of the left frontal sinus may be related to the nasal mass. 5. Mild to moderate medium and small vessel disease bilaterally, most evident in the left MCA distribution and bilateral posterior cerebral arteries. 6. Moderate stenoses of the proximal posterior cerebral arteries bilaterally. 7. No other significant proximal stenosis, aneurysm, or branch vessel occlusion.   Electronically Signed   By: Lawrence Santiago M.D.   On: 04/24/2014 16:50    EKG: Independently reviewed. Sinus rhythm, rate 99  Assessment/Plan  Acute CVA -Patient will be admitted to telemetry  floor to monitor for any tachycardia or bradycardia arrhythmias -MRI of the brain shows acute nonhemorrhagic infarct with in the left thalamus -MRA of the brain: mild to moderate medium and small vessel disease bilaterally most evident in the left MCA distribution -neurology consulted and appreciated -Echocardiogram, carotid Doppler, lipid panel, hemoglobin A1c ordered -Patient does have several risk factors for stroke including hypertension, smoking, obesity -Will place patient on aspirin -PT and OT consulted for evaluation and treatment -Patient did pass her swallow study in the emergency department and is currently eating  Leukocytosis -Likely reactive -Will obtain chest x-ray to rule out infection -UA negative for infection -will not start antibiotics S patient is afebrile  Hypertension -Will hold HCTZ at this timeto allow for permissive hypertension in the setting of acute stroke  Hyperglycemia -patient does not have a known history of diabetes -Will place her on insulin sliding scale with CBG monitoring -hemoglobin A1c pending  Hypokalemia -Likely secondary to diuretics -Will replace and continue to monitor BMP  Tobacco abuse -Smoking cessation counseling given -Nicotine patch ordered  Morbid obesity -BMI of 40 -Patient will need to discuss a lifestyle and dietary modification plan with her primary care physician upon discharge  Sleep apnea -Will placed patient on sleep apnea daily at bedtime  Retinitis pigmentosa -Continue eyedrops  Depression -continue quetiapine and sertraline  GERD -Continue PPI  DVT prophylaxis: Lovenox  Code Status: Full  Condition: guarded  Family Communication: none at bedside. Admission, patients condition and plan of care including tests being ordered have been discussed with the patient,  who indicates understanding and agrees with the plan and Code Status.  Disposition Plan: Admitted   Time spent: 60 minutes  ,   D.O. [facility] Pager [phone]  If 7PM-7AM, please contact night-coverage [email or web address] Password [email or web address] 04/24/2014, 4:58 PM

## 2014-04-24 NOTE — ED Provider Notes (Signed)
Medical screening examination/treatment/procedure(s) were conducted as a shared visit with non-physician practitioner(s) and myself.  I personally evaluated the patient during the encounter.   EKG Interpretation   Date/Time:  Thursday April 24 2014 10:04:49 EST Ventricular Rate:  99 PR Interval:  134 QRS Duration: 82 QT Interval:  406 QTC Calculation: 521 R Axis:   -76 Text Interpretation:  Normal sinus rhythm Possible Left atrial enlargement  Left anterior fascicular block Prolonged QT Abnormal ECG Confirmed by  Daniesha Driver,  DO, Govani Radloff [id number]) on 04/24/2014 11:55:06 AM      Pt is a 53 y.o. female with history of hypertension, hyperlipidemia who presents to the emergency department with scattered areas of right numbness that have been increasing for the past several days after receiving an influenza vaccination. Denies any weakness. Denies any headache. No chest pain or shortness of breath. She's never had similar symptoms. On exam, patient does have some sensory deficits but there are scattered and do not seem to follow a dermatomal or peripheral nerve distribution. Not concerned for stroke. She does have apical strength in all of her extremities. She does have diminished reflexes however in her right upper and lower extremity compared to her left but no clonus there is normal gait.  She denies any neck or back pain and has no midline tenderness.  Labs show mild leukocytosis but are otherwise unremarkable. MRI brain requested by Dr. Nicole Kindred with neurology shows acute stroke. Patient to be admitted to medicine. Neurology to consult.  [place], DO 04/24/14 [id number]

## 2014-04-24 NOTE — Plan of Care (Signed)
Problem: Consults Goal: Ischemic Stroke Patient Education See Patient Education Module for education specifics. Outcome: Completed/Met Date Met:  04/24/14

## 2014-04-24 NOTE — ED Provider Notes (Signed)
CSN: [id number]     Arrival date & time 04/24/14  3335 History   First MD Initiated Contact with Patient 04/24/14 1107     Chief Complaint  Patient presents with  . Numbness     (Consider location/radiation/quality/duration/timing/severity/associated sxs/prior Treatment) HPI   Susan Farley Is a 53 year old female with a past medical history of hypertension, hypercholesterolemia, retinitis pigmentosa, current daily smoker. She presents emergency department chief complaint of numbness. The patient had her flu vaccination 8 days ago. On Monday the patient got into the shower and noticed numbness in the right shoulder, arm, buttocks and leg. She states that the right leg numbness wrapped down to her knee and has resolved somewhat since then. She also has some right facial numbness. She denies any weakness, difficulty with speech or swallowing, headaches, changes in vision, difficulty with speech, difficulty with swallowing or vertigo. She has never had any sensation like this before. She denies history of low back pain or neck pain. Denies fevers, chills, myalgias, arthralgias. Denies DOE, SOB, chest tightness or pressure, radiation to left arm, jaw or back, or diaphoresis. Denies dysuria, flank pain, suprapubic pain, frequency, urgency, or hematuria.  Denies abdominal pain, nausea, vomiting, diarrhea or constipation.    Past Medical History  Diagnosis Date  . Hypertension   . Hypercholesteremia   . Retinitis pigmentosa   . Colitis    Past Surgical History  Procedure Laterality Date  . Cataract extraction    . Oophorectomy    . Bone graft hip iliac crest    . Iud removal     Family History  Problem Relation Age of Onset  . Adopted: Yes  . Family history unknown: Yes   History  Substance Use Topics  . Smoking status: Current Every Day Smoker -- 1.50 packs/day  . Smokeless tobacco: Never Used  . Alcohol Use: No   OB History    No data available     Review of Systems Ten  systems reviewed and are negative for acute change, except as noted in the HPI.     Allergies  Shellfish allergy  Home Medications   Prior to Admission medications   Medication Sig Start Date End Date Taking? Authorizing Provider  dorzolamide (TRUSOPT) 2 % ophthalmic solution Place 1 drop into both eyes 2 (two) times daily.   Yes Historical Provider, MD  hydrochlorothiazide (HYDRODIURIL) 25 MG tablet Take 25 mg by mouth daily.   Yes Historical Provider, MD  pantoprazole (PROTONIX) 40 MG tablet Take 40 mg by mouth daily.    Yes Historical Provider, MD  QUETIAPINE FUMARATE PO Take 0.5 tablets by mouth daily.   Yes Historical Provider, MD  timolol (BETIMOL) 0.5 % ophthalmic solution Place 1 drop into both eyes 2 (two) times daily.   Yes Historical Provider, MD  Vitamin D, Ergocalciferol, (DRISDOL) 50000 UNITS CAPS capsule Take 50,000 Units by mouth every 7 (seven) days.   Yes Historical Provider, MD  ondansetron (ZOFRAN ODT) 8 MG disintegrating tablet Take 1 tablet (8 mg total) by mouth every 8 (eight) hours as needed for nausea. 05/28/12   Shaune Pollack, MD  oxycodone (OXY-IR) 5 MG capsule Take 1 capsule (5 mg total) by mouth every 4 (four) hours as needed. 05/28/12   Shaune Pollack, MD  peg 3350 powder (MOVIPREP) 100 G SOLR Take 1 kit (100 g total) by mouth once. 07/25/12   Jerene Bears, MD  sertraline (ZOLOFT) 100 MG tablet Take 100 mg by mouth daily.  Historical Provider, MD  tetracaine (PONTOCAINE) 0.5 % ophthalmic solution Place 1 drop into both eyes once.    Historical Provider, MD   BP 132/68 mmHg  Pulse 87  Temp(Src) 98.5 F (36.9 C) (Oral)  Resp 20  Ht '5\' 4"'  (1.626 m)  Wt 235 lb (106.595 kg)  BMI 40.32 kg/m2  SpO2 97%  LMP 09/27/2011 Physical Exam  Constitutional: She is oriented to person, place, and time. She appears well-developed and well-nourished. No distress.  HENT:  Head: Normocephalic and atraumatic.  Eyes: Conjunctivae are normal. No scleral icterus.  Neck:  Normal range of motion.  Cardiovascular: Normal rate, regular rhythm and normal heart sounds.  Exam reveals no gallop and no friction rub.   No murmur heard. Pulmonary/Chest: Effort normal and breath sounds normal. No respiratory distress.  Abdominal: Soft. Bowel sounds are normal. She exhibits no distension and no mass. There is no tenderness. There is no guarding.  Musculoskeletal: Normal range of motion.  Neurological: She is alert and oriented to person, place, and time.  Speech is clear and goal oriented, follows commands Major Cranial nerves without deficit, no facial droop Normal strength in upper and lower extremities bilaterally including dorsiflexion and plantar flexion, strong and equal grip strength Sensation ABNORMAL TO LIGHT TOUCH ON THE RIGHT WITH SUBJECTIVE NUMBNESS Moves extremities without ataxia, coordination intact Normal finger to nose and rapid alternating movements Neg romberg, no pronator drift Normal gait Normal heel-shin and balance UNABLE TO ILLICIT REFLEXES  Skin: Skin is warm and dry. She is not diaphoretic.  Nursing note and vitals reviewed.    ED Course  Procedures (including critical care time) Labs Review Labs Reviewed  CBC - Abnormal; Notable for the following:    WBC 13.6 (*)    Hemoglobin 15.2 (*)    HCT 46.6 (*)    RDW 15.6 (*)    All other components within normal limits  DIFFERENTIAL - Abnormal; Notable for the following:    Neutro Abs 9.6 (*)    All other components within normal limits  COMPREHENSIVE METABOLIC PANEL - Abnormal; Notable for the following:    Potassium 3.5 (*)    Chloride 93 (*)    Glucose, Bld 205 (*)    Alkaline Phosphatase 135 (*)    All other components within normal limits  I-STAT CHEM 8, ED - Abnormal; Notable for the following:    Potassium 3.4 (*)    Chloride 93 (*)    Glucose, Bld 214 (*)    Calcium, Ion 1.24 (*)    Hemoglobin 17.0 (*)    HCT 50.0 (*)    All other components within normal limits  ETHANOL   PROTIME-INR  APTT  URINE RAPID DRUG SCREEN (HOSP PERFORMED)  URINALYSIS, ROUTINE W REFLEX MICROSCOPIC  I-STAT TROPOININ, ED    Imaging Review No results found.   EKG Interpretation   Date/Time:  Thursday April 24 2014 10:04:49 EST Ventricular Rate:  99 PR Interval:  134 QRS Duration: 82 QT Interval:  406 QTC Calculation: 521 R Axis:   -76 Text Interpretation:  Normal sinus rhythm Possible Left atrial enlargement  Left anterior fascicular block Prolonged QT Abnormal ECG Confirmed by  WARD,  DO, KRISTEN ([id number]) on 04/24/2014 11:55:06 AM      MDM   Final diagnoses:  None      1:00 PM BP 132/68 mmHg  Pulse 87  Temp(Src) 98.5 F (36.9 C) (Oral)  Resp 20  Ht '5\' 4"'  (1.626 m)  Wt 235 lb (106.595 kg)  BMI 40.32 kg/m2  SpO2 97%  LMP 09/27/2011  Dr ward able to illicit left sided reflexes, however she cannot illicit R sided. I have asked Dt. Nicole Kindred to consult. CT is negative. Patient's blood sugar is elevated to 14. She has elevated calcium ions. Her white blood cell count is elevated with a slight left shift. I spoke with Dr. Nicole Kindred requests a lumbar puncture if his examination is abnormal.    1:21 PM BP 121/95 mmHg  Pulse 80  Temp(Src) 98.5 F (36.9 C) (Oral)  Resp 14  Ht '5\' 4"'  (1.626 m)  Wt 235 lb (106.595 kg)  BMI 40.32 kg/m2  SpO2 92%  LMP 09/27/2011 Patient reflexes normal per neurology however RF for stroke are high and Dr Nicole Kindred reqeusts MRI to r/ stroke. No concern for Guillain-Barre,, brown-sequard.   4:18 PM Patient MRI shows acute thalamic stroke. MRA has been ordered.  Consultation with Dr. Nicole Kindred requests a medicine admission. He will consult on the patient.     Margarita Mail, PA-C 04/24/14 1649

## 2014-04-24 NOTE — ED Notes (Signed)
Pt given crackers and peanut butter. Hamburger and fries ordered.

## 2014-04-24 NOTE — ED Notes (Signed)
Pt returned from MRI °

## 2014-04-24 NOTE — Plan of Care (Signed)
Problem: Consults Goal: Nutrition Consult-if indicated Outcome: Completed/Met Date Met:  04/24/14     

## 2014-04-24 NOTE — Plan of Care (Signed)
Problem: Consults Goal: Diabetes Guidelines if Diabetic/Glucose > 140 If diabetic or lab glucose is > 140 mg/dl - Initiate Diabetes/Hyperglycemia Guidelines & Document Interventions  Outcome: Completed/Met Date Met:  04/24/14     

## 2014-04-24 NOTE — Consult Note (Signed)
Reason for Consult:right-sided numbness.  HPI:                                                                                                                                          Susan Farley is an 53 y.o. female with a history of hypertension, hypercholesterolemia and retinitis pigmentosa who came to the emergency room for evaluation of numbness involving the right side which started 9 days ago. Numbness involving face arm and leg. She had no weakness and no changes in speech or swallowing. She had received a flu shot about 5 days prior to onset of numbness. At this point she is only experiencing slight numbness involving her neck and back as well as anterior right thigh.Marland Kitchen NIH stroke score was 0. 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  LSN: 04/15/2014 tPA:  No; beyond time window for treatment consideration  Past Medical History  Diagnosis Date  . Hypertension   . Hypercholesteremia   . Retinitis pigmentosa   . Colitis     Past Surgical History  Procedure Laterality Date  . Cataract extraction    . Oophorectomy    . Bone graft hip iliac crest    . Iud removal      Family History  Problem Relation Age of Onset  . Adopted: Yes  . Family history unknown: Yes    Social History:  reports that she has been smoking.  She has never used smokeless tobacco. She reports that she does not drink alcohol or use illicit drugs.  Allergies  Allergen Reactions  . Shellfish Allergy Anaphylaxis    MEDICATIONS:                                                                                                                     I have reviewed the patient's current medications.   ROS:  History obtained from the patient  General ROS: negative for - chills, fatigue, fever, night sweats, weight gain or weight loss Psychological ROS:  negative for - behavioral disorder, hallucinations, memory difficulties, mood swings or suicidal ideation Ophthalmic ROS: negative for - blurry vision, double vision, eye pain or loss of vision ENT ROS: negative for - epistaxis, nasal discharge, oral lesions, sore throat, tinnitus or vertigo Allergy and Immunology ROS: negative for - hives or itchy/watery eyes Hematological and Lymphatic ROS: negative for - bleeding problems, bruising or swollen lymph nodes Endocrine ROS: negative for - galactorrhea, hair pattern changes, polydipsia/polyuria or temperature intolerance Respiratory ROS: negative for - cough, hemoptysis, shortness of breath or wheezing Cardiovascular ROS: negative for - chest pain, dyspnea on exertion, edema or irregular heartbeat Gastrointestinal ROS: negative for - abdominal pain, diarrhea, hematemesis, nausea/vomiting or stool incontinence Genito-Urinary ROS: negative for - dysuria, hematuria, incontinence or urinary frequency/urgency Musculoskeletal ROS: negative for - joint swelling or muscular weakness Neurological ROS: as noted in HPI Dermatological ROS: negative for rash and skin lesion changes   Blood pressure 121/95, pulse 80, temperature 98.5 F (36.9 C), temperature source Oral, resp. rate 14, height '5\' 4"'  (1.626 m), weight 106.595 kg (235 lb), last menstrual period 09/27/2011, SpO2 92 %.   Neurologic Examination:                                                                                                      Mental Status: Alert, oriented, thought content appropriate.  Speech fluent without evidence of aphasia. Able to follow commands without difficulty. Cranial Nerves: II-Visual fields were normal. III/IV/VI-Pupils were equal and reacted. Extraocular movements were full and conjugate.    V/VII-no facial numbness and no facial weakness. VIII-normal. X-normal speech and symmetrical palatal movement. XII-midline tongue extension Motor: 5/5 bilaterally with  normal tone and bulk Sensory: Normal throughout. Deep Tendon Reflexes: 2+ and symmetric, except for ankle reflexes which were 1+ and symmetric. Plantars: Flexor bilaterally Cerebellar: Normal finger-to-nose testing.  No results found for: CHOL  Results for orders placed or performed during the hospital encounter of 04/24/14 (from the past 48 hour(s))  Ethanol     Status: None   Collection Time: 04/24/14 11:28 AM  Result Value Ref Range   Alcohol, Ethyl (B) <11 0 - 11 mg/dL    Comment:        LOWEST DETECTABLE LIMIT FOR SERUM ALCOHOL IS 11 mg/dL FOR MEDICAL PURPOSES ONLY   Protime-INR     Status: None   Collection Time: 04/24/14 11:28 AM  Result Value Ref Range   Prothrombin Time 12.7 11.6 - 15.2 seconds   INR 0.95 0.00 - 1.49  APTT     Status: None   Collection Time: 04/24/14 11:28 AM  Result Value Ref Range   aPTT 28 24 - 37 seconds  CBC     Status: Abnormal   Collection Time: 04/24/14 11:28 AM  Result Value Ref Range   WBC 13.6 (H) 4.0 - 10.5 K/uL   RBC 5.07 3.87 - 5.11 MIL/uL   Hemoglobin 15.2 (H) 12.0 - 15.0 g/dL  HCT 46.6 (H) 36.0 - 46.0 %   MCV 91.9 78.0 - 100.0 fL   MCH 30.0 26.0 - 34.0 pg   MCHC 32.6 30.0 - 36.0 g/dL   RDW 15.6 (H) 11.5 - 15.5 %   Platelets 321 150 - 400 K/uL  Differential     Status: Abnormal   Collection Time: 04/24/14 11:28 AM  Result Value Ref Range   Neutrophils Relative % 71 43 - 77 %   Neutro Abs 9.6 (H) 1.7 - 7.7 K/uL   Lymphocytes Relative 23 12 - 46 %   Lymphs Abs 3.2 0.7 - 4.0 K/uL   Monocytes Relative 5 3 - 12 %   Monocytes Absolute 0.6 0.1 - 1.0 K/uL   Eosinophils Relative 1 0 - 5 %   Eosinophils Absolute 0.2 0.0 - 0.7 K/uL   Basophils Relative 0 0 - 1 %   Basophils Absolute 0.0 0.0 - 0.1 K/uL  Comprehensive metabolic panel     Status: Abnormal   Collection Time: 04/24/14 11:28 AM  Result Value Ref Range   Sodium 139 137 - 147 mEq/L   Potassium 3.5 (L) 3.7 - 5.3 mEq/L   Chloride 93 (L) 96 - 112 mEq/L   CO2 32 19 - 32  mEq/L   Glucose, Bld 205 (H) 70 - 99 mg/dL   BUN 13 6 - 23 mg/dL   Creatinine, Ser 0.68 0.50 - 1.10 mg/dL   Calcium 10.5 8.4 - 10.5 mg/dL   Total Protein 8.3 6.0 - 8.3 g/dL   Albumin 3.9 3.5 - 5.2 g/dL   AST 20 0 - 37 U/L   ALT 26 0 - 35 U/L   Alkaline Phosphatase 135 (H) 39 - 117 U/L   Total Bilirubin 0.3 0.3 - 1.2 mg/dL   GFR calc non Af Amer >90 >90 mL/min   GFR calc Af Amer >90 >90 mL/min    Comment: (NOTE) The eGFR has been calculated using the CKD EPI equation. This calculation has not been validated in all clinical situations. eGFR's persistently <90 mL/min signify possible Chronic Kidney Disease.    Anion gap 14 5 - 15  Urine Drug Screen     Status: None   Collection Time: 04/24/14 11:28 AM  Result Value Ref Range   Opiates NONE DETECTED NONE DETECTED   Cocaine NONE DETECTED NONE DETECTED   Benzodiazepines NONE DETECTED NONE DETECTED   Amphetamines NONE DETECTED NONE DETECTED   Tetrahydrocannabinol NONE DETECTED NONE DETECTED   Barbiturates NONE DETECTED NONE DETECTED    Comment:        DRUG SCREEN FOR MEDICAL PURPOSES ONLY.  IF CONFIRMATION IS NEEDED FOR ANY PURPOSE, NOTIFY LAB WITHIN 5 DAYS.        LOWEST DETECTABLE LIMITS FOR URINE DRUG SCREEN Drug Class       Cutoff (ng/mL) Amphetamine      1000 Barbiturate      200 Benzodiazepine   048 Tricyclics       889 Opiates          300 Cocaine          300 THC              50   Urinalysis, Routine w reflex microscopic     Status: None   Collection Time: 04/24/14 11:28 AM  Result Value Ref Range   Color, Urine YELLOW YELLOW   APPearance CLEAR CLEAR   Specific Gravity, Urine 1.022 1.005 - 1.030   pH 5.0 5.0 - 8.0  Glucose, UA NEGATIVE NEGATIVE mg/dL   Hgb urine dipstick NEGATIVE NEGATIVE   Bilirubin Urine NEGATIVE NEGATIVE   Ketones, ur NEGATIVE NEGATIVE mg/dL   Protein, ur NEGATIVE NEGATIVE mg/dL   Urobilinogen, UA 0.2 0.0 - 1.0 mg/dL   Nitrite NEGATIVE NEGATIVE   Leukocytes, UA NEGATIVE NEGATIVE     Comment: MICROSCOPIC NOT DONE ON URINES WITH NEGATIVE PROTEIN, BLOOD, LEUKOCYTES, NITRITE, OR GLUCOSE <1000 mg/dL.  I-stat troponin, ED (not at [facility])     Status: None   Collection Time: 04/24/14 11:36 AM  Result Value Ref Range   Troponin i, poc 0.00 0.00 - 0.08 ng/mL   Comment 3            Comment: Due to the release kinetics of cTnI, a negative result within the first hours of the onset of symptoms does not rule out myocardial infarction with certainty. If myocardial infarction is still suspected, repeat the test at appropriate intervals.   I-Stat Chem 8, ED     Status: Abnormal   Collection Time: 04/24/14 11:37 AM  Result Value Ref Range   Sodium 139 137 - 147 mEq/L   Potassium 3.4 (L) 3.7 - 5.3 mEq/L   Chloride 93 (L) 96 - 112 mEq/L   BUN 13 6 - 23 mg/dL   Creatinine, Ser 0.80 0.50 - 1.10 mg/dL   Glucose, Bld 214 (H) 70 - 99 mg/dL   Calcium, Ion 1.24 (H) 1.12 - 1.23 mmol/L   TCO2 30 0 - 100 mmol/L   Hemoglobin 17.0 (H) 12.0 - 15.0 g/dL   HCT 50.0 (H) 36.0 - 46.0 %    No results found.   Assessment/Plan: 53 year old lady with a history of hypertension and hyperlipidemia presenting with subacute left thalamic infarction.  Recommendations: 1. Aspirin 325 mg per day 2. Carotid Doppler study 3. 2-D echocardiogram 4. PT and OT consults 5. Telemetry bed  C.R. Nicole Kindred, MD [facility] [phone]  04/24/2014, 1:33 PM

## 2014-04-25 ENCOUNTER — Inpatient Hospital Stay (HOSPITAL_COMMUNITY): Payer: Federal, State, Local not specified - PPO

## 2014-04-25 DIAGNOSIS — I639 Cerebral infarction, unspecified: Secondary | ICD-10-CM | POA: Insufficient documentation

## 2014-04-25 DIAGNOSIS — I517 Cardiomegaly: Secondary | ICD-10-CM

## 2014-04-25 DIAGNOSIS — E1159 Type 2 diabetes mellitus with other circulatory complications: Secondary | ICD-10-CM

## 2014-04-25 DIAGNOSIS — R200 Anesthesia of skin: Secondary | ICD-10-CM | POA: Insufficient documentation

## 2014-04-25 DIAGNOSIS — E785 Hyperlipidemia, unspecified: Secondary | ICD-10-CM

## 2014-04-25 DIAGNOSIS — I63212 Cerebral infarction due to unspecified occlusion or stenosis of left vertebral arteries: Secondary | ICD-10-CM

## 2014-04-25 LAB — BASIC METABOLIC PANEL
Anion gap: 13 (ref 5–15)
BUN: 14 mg/dL (ref 6–23)
CHLORIDE: 100 meq/L (ref 96–112)
CO2: 30 meq/L (ref 19–32)
Calcium: 10.1 mg/dL (ref 8.4–10.5)
Creatinine, Ser: 0.66 mg/dL (ref 0.50–1.10)
GFR calc non Af Amer: >90 mL/min (ref >90)
Glucose, Bld: 179 mg/dL — ABNORMAL HIGH (ref 70–99)
POTASSIUM: 4.2 meq/L (ref 3.7–5.3)
SODIUM: 143 meq/L (ref 137–147)

## 2014-04-25 LAB — CBC
HCT: 44.4 % (ref 36.0–46.0)
Hemoglobin: 14.3 g/dL (ref 12.0–15.0)
MCH: 29.8 pg (ref 26.0–34.0)
MCHC: 32.2 g/dL (ref 30.0–36.0)
MCV: 92.5 fL (ref 78.0–100.0)
PLATELETS: 269 10*3/uL (ref 150–400)
RBC: 4.8 MIL/uL (ref 3.87–5.11)
RDW: 15.7 % — AB (ref 11.5–15.5)
WBC: 10.9 10*3/uL — AB (ref 4.0–10.5)

## 2014-04-25 LAB — GLUCOSE, CAPILLARY
GLUCOSE-CAPILLARY: 239 mg/dL — AB (ref 70–99)
Glucose-Capillary: 164 mg/dL — ABNORMAL HIGH (ref 70–99)

## 2014-04-25 LAB — HEMOGLOBIN A1C
Hgb A1c MFr Bld: 7.8 % — ABNORMAL HIGH (ref <5.7)
Mean Plasma Glucose: 177 mg/dL — ABNORMAL HIGH (ref <117)

## 2014-04-25 LAB — LIPID PANEL
CHOL/HDL RATIO: 8.1 ratio
Cholesterol: 283 mg/dL — ABNORMAL HIGH (ref 0–200)
HDL: 35 mg/dL — AB (ref >39)
LDL CALC: UNDETERMINED mg/dL (ref 0–99)
Triglycerides: 585 mg/dL — ABNORMAL HIGH (ref <150)
VLDL: UNDETERMINED mg/dL (ref 0–40)

## 2014-04-25 MED ORDER — QUETIAPINE FUMARATE 25 MG PO TABS
25.0000 mg | ORAL_TABLET | Freq: Every day | ORAL | Status: DC
Start: 2014-04-25 — End: 2014-04-25
  Filled 2014-04-25: qty 1

## 2014-04-25 MED ORDER — ASPIRIN 325 MG PO TBEC: 325.0000 mg | DELAYED_RELEASE_TABLET | Freq: Every day | ORAL | Status: AC

## 2014-04-25 MED ORDER — SIMVASTATIN 20 MG PO TABS
20.0000 mg | ORAL_TABLET | Freq: Every day | ORAL | Status: DC
  Administered 2014-04-25: 20 mg via ORAL
  Filled 2014-04-25: qty 1

## 2014-04-25 MED ORDER — NICOTINE 21 MG/24HR TD PT24: 21.0000 mg | MEDICATED_PATCH | Freq: Every day | TRANSDERMAL | Status: DC

## 2014-04-25 MED ORDER — IOHEXOL 350 MG/ML SOLN
50.0000 mL | Freq: Once | INTRAVENOUS | Status: AC | PRN
  Administered 2014-04-25: 50 mL via INTRAVENOUS

## 2014-04-25 MED ORDER — SIMVASTATIN 20 MG PO TABS: 20.0000 mg | ORAL_TABLET | Freq: Every day | ORAL | Status: DC

## 2014-04-25 NOTE — Evaluation (Signed)
Occupational Therapy Evaluation Patient Details Name: Susan Farley MRN: [id number] DOB: 1960/11/21 Today's Date: 04-28-14    History of Present Illness Came to ED with c/o numbness of right side which started ~9 days ago.  MRI showed small left thalamic infarction.   Clinical Impression   Patient evaluated by Occupational Therapy with no further acute OT needs identified. All education has been completed and the patient has no further questions.  OT is signing off. Thank you for this referral.     Follow Up Recommendations  No OT follow up    Equipment Recommendations  None recommended by OT    Precautions / Restrictions None     Mobility Bed Mobility Overal bed mobility: Independent   Transfers Overall transfer level: Independent Equipment used: None       Balance Overall balance assessment: Independent       ADL Overall ADL's : At baseline     General ADL Comments: Per demonstration and patient report     Vision H/o retinitis pigmentosa, wears glasses at all times, reports vision is at baseline.    Pertinent Vitals/Pain No c/o    Hand Dominance Right   Extremity/Trunk Assessment Upper Extremity Assessment Upper Extremity Assessment: Overall WFL for tasks assessed;RUE deficits/detail RUE Sensation:  (reports numbness)   Lower Extremity Assessment Lower Extremity Assessment: Defer to PT evaluation   Cervical / Trunk Assessment Cervical / Trunk Assessment: Normal   Communication Communication Communication: No difficulties   Cognition Arousal/Alertness: Awake/alert   Overall Cognitive Status: Within Functional Limits for tasks assessed       Home Living Family/patient expects to be discharged to:: Private residence Living Arrangements: Alone Available Help at Discharge: Family;Friend(s) Type of Home: House Home Access: Level entry     Home Layout: One level     Bathroom Shower/Tub: Walk-in Hydrologist:  Standard     Home Equipment: Cane - single point;Hand held shower head          Prior Functioning/Environment Level of Independence: Independent            End of Session    Activity Tolerance: Patient tolerated treatment well Patient left: in chair;with call bell/phone within reach   Time: 1350-1414 OT Time Calculation (min): 24 min Charges:  OT General Charges $OT Visit: 1 Procedure OT Evaluation $Initial OT Evaluation Tier I: 1 Procedure G-Codes:    Susan Farley 2014/04/28, 2:15 PM

## 2014-04-25 NOTE — Evaluation (Addendum)
Physical Therapy Evaluation and Discharge Patient Details Name: Susan Farley MRN: [id number] DOB: Jun 02, 1961 Today's Date: 04/25/2014   History of Present Illness  With a history of retinitis pigmentosa, hypertension, sleep apnea that presented to the emergency department with complaints of right-sided numbness. Patient stated she received her flu vaccination approximately 5 days ago, and shortly thereafter, the numbness began. The numbness is described to be on the right side of the face, neck, back, shoulder, arm. However the numbness is patchy not involving whole limbs. Patient is also had numbness of the right anterior thigh. Upon arrival to the emergency department, neurology was consulted and recommended MRI which did show a small left thalamic infarct.  Clinical Impression  Patient evaluated by Physical Therapy with no further acute PT needs identified. All education has been completed and the patient has no further questions. At the time of PT eval pt was able to perform transfers and ambulation independently. Pt reports she is not quite at her baseline but feels this is due to being in bed so much the past couple of days. See below for any follow-up Physial Therapy or equipment needs. PT is signing off. Thank you for this referral.      Follow Up Recommendations No PT follow up    Equipment Recommendations  None recommended by PT    Recommendations for Other Services       Precautions / Restrictions Precautions Precautions: Fall Restrictions Weight Bearing Restrictions: No      Mobility  Bed Mobility Overal bed mobility: Independent             General bed mobility comments: Pt sitting in recliner chair upon PT arrival.   Transfers Overall transfer level: Independent Equipment used: None             General transfer comment: No physical assist required.   Ambulation/Gait Ambulation/Gait assistance: Independent Ambulation Distance (Feet): 500  Feet Assistive device: None Gait Pattern/deviations: WFL(Within Functional Limits) Gait velocity: Decreased Gait velocity interpretation: Below normal speed for age/gender General Gait Details: Pt with somewhat antalgic gait pattern due to leg length descrepency. Pt states this is her baseline of function.   Stairs            Wheelchair Mobility    Modified Rankin (Stroke Patients Only)       Balance Overall balance assessment: Modified Independent                                           Pertinent Vitals/Pain Pain Assessment: No/denies pain    Home Living Family/patient expects to be discharged to:: Private residence Living Arrangements: Alone Available Help at Discharge: Family;Friend(s);Available 24 hours/day (At least the first few days) Type of Home: House Home Access: Level entry     Home Layout: One level Home Equipment: Cane - single point;Hand held shower head      Prior Function Level of Independence: Independent         Comments: Still driving, retired     Engineer, manufacturing Dominance   Dominant Hand: Right    Extremity/Trunk Assessment   Upper Extremity Assessment: Overall WFL for tasks assessed     RUE Sensation:  (reports numbness)     Lower Extremity Assessment: Overall WFL for tasks assessed;LLE deficits/detail   LLE Deficits / Details: Leg length descrepency this side. Pt states she received a shoe lift from therapy which  helps.   Cervical / Trunk Assessment: Normal  Communication   Communication: No difficulties  Cognition Arousal/Alertness: Awake/alert Behavior During Therapy: WFL for tasks assessed/performed Overall Cognitive Status: Within Functional Limits for tasks assessed                      General Comments      Exercises        Assessment/Plan    PT Assessment Patent does not need any further PT services  PT Diagnosis     PT Problem List    PT Treatment Interventions     PT Goals  (Current goals can be found in the Care Plan section) Acute Rehab PT Goals PT Goal Formulation: All assessment and education complete, DC therapy    Frequency     Barriers to discharge        Co-evaluation               End of Session   Activity Tolerance: Patient tolerated treatment well Patient left: in chair;with call bell/phone within reach;with nursing/sitter in room Nurse Communication: Mobility status         Time: 4628-6381 PT Time Calculation (min): 17 min   Charges:   PT Evaluation $Initial PT Evaluation Tier I: 1 Procedure PT Treatments $Therapeutic Activity: 8-22 mins   PT G Codes:          Rolinda Roan 04/25/2014, 2:59 PM   Rolinda Roan, PT, DPT Acute Rehabilitation Services Pager: [phone]

## 2014-04-25 NOTE — Plan of Care (Signed)
Problem: Acute Treatment Outcomes Goal: Neuro exam at baseline or improved Outcome: Completed/Met Date Met:  04/25/14 Goal: Airway maintained/protected Outcome: Completed/Met Date Met:  04/25/14 Goal: 02 Sats > 94% Outcome: Completed/Met Date Met:  04/25/14 Goal: Hemodynamically stable Outcome: Completed/Met Date Met:  04/25/14 Goal: tPA Patient w/o S&S of bleeding Outcome: Not Applicable Date Met:  89/48/34

## 2014-04-25 NOTE — Progress Notes (Signed)
*  PRELIMINARY RESULTS* Vascular Ultrasound Carotid Duplex (Doppler) has been completed.  Findings suggest 1-39% right internal carotid artery stenosis and low range 60-79% left internal carotid artery stenosis. Vertebral arteries are patent with antegrade flow.  04/25/2014 11:14 AM Maudry Mayhew, RVT, RDCS, RDMS

## 2014-04-25 NOTE — Discharge Summary (Signed)
Physician Discharge Summary  Susan Farley OHY:[id number] DOB: 10-14-1960 DOA: 04/24/2014  PCP: No primary care provider on file.  Admit date: 04/24/2014 Discharge date: 04/25/2014  Time spent: 45 minutes  Recommendations for Outpatient Follow-up:  Patient will be discharged to home.  She is to follow up with her primary care physician within one week of discharge.  Patient should also follow up with Dr. Erlinda Hong, neurology, within 2 months of discharge.  Patient will also need to follow up with Dr. Donnetta Hutching, vascular surgeon within 1-2 weeks of discharge.  Patient should continue to take her medications as prescribed and follow a heart healthy/carb modified diet.  Patient may resume activity as tolerated.  Discharge Diagnoses:  Principal Problem:   Acute CVA (cerebrovascular accident) Active Problems:   HTN (hypertension)   Retinitis pigmentosa   GERD (gastroesophageal reflux disease)   Acute ischemic VBA thalamic stroke   Essential hypertension   Hyperglycemia   Leukocytosis   Hypokalemia   Numbness on right side   Stroke  Discharge Condition: Stable  Diet recommendation: Heart healthy/carb modified  Filed Weights   04/24/14 1008 04/24/14 2304  Weight: 106.595 kg (235 lb) 106.8 kg (235 lb 7.2 oz)    History of present illness:  On 04/24/2014 Susan Farley is a 53 y.o. female with a history of retinitis pigmentosa, hypertension, sleep apnea that presented to the emergency department with complaints of right-sided numbness. Patient stated she received her flu vaccination approximately 5 days ago, and shortly thereafter, the numbness began. The numbness is described to be on the right side of the face, neck, back, shoulder, arm. However the numbness is patchy not involving whole limbs. Patient is also had numbness of the right anterior thigh. Upon arrival to the emergency department, neurology was consulted and recommended MRI which did show a small left thalamic infarct. TRH was called  for admission.  Hospital Course:  Acute CVA -Patient will be admitted to telemetry floor to monitor for any tachycardia or bradycardia arrhythmias -MRI of the brain shows acute nonhemorrhagic infarct with in the left thalamus -MRA of the brain: mild to moderate medium and small vessel disease bilaterally most evident in the left MCA distribution -Echocardiogram: EF 65-70%, wall thickness increased with mild LVH pattern -Carotid doppler: 1-39% right internal carotid artery stenosis and low range 60-79% left internal carotid artery stenosis, vertebral arteries are patent with antegrade flow -neurology consulted and appreciated and recommended CTA neck given her carotid doppler findings -CTA neck: mild narrowing of proixmal left ICA without significant stenosis -lipid panel: total cholesterol 283, triglycerides 585, HDL 35, LDL unable to calculate -Hemoglobin A1c pending -Patient does have several risk factors for stroke including hypertension, smoking, obesity -continue aspirin -spoke with patient regarding statin, she states it is not helped her in the past. -PT and OT consulted for evaluation and treatment and recommended no further therapy  Leukocytosis -Likely reactive and has resolved -CXR and UA negative for infection  Hypertension -Continue home medications  Hyperglycemia -patient does not have a known history of diabetes -Will place her on insulin sliding scale with CBG monitoring -hemoglobin A1c pending  Hypokalemia -Resolved, Likely secondary to diuretics  Tobacco abuse -Smoking cessation counseling given -Nicotine patch ordered  Morbid obesity -BMI of 40 -Patient will need to discuss a lifestyle and dietary modification plan with her primary care physician upon discharge  Sleep apnea -Continue CPAP  Retinitis pigmentosa -Continue eyedrops  Depression -continue quetiapine and sertraline  GERD -Continue PPI  Hyperlipidemia -lipid panel: total  cholesterol  283, triglycerides 585, HDL 35, LDL unable to calculate -Patient will be started on statin and to continue to take this. She should also speak to her primary care physician.  Procedures: Echocardiogram Carotid Doppler  Consultations: Neurology   Discharge Exam: Filed Vitals:   04/25/14 1300  BP: 135/50  Pulse: 72  Temp: 98.2 F (36.8 C)  Resp: 19     General: Well developed, well nourished, NAD, appears stated age  HEENT: NCAT, mucous membranes moist.  Cardiovascular: S1 S2 auscultated, no rubs, murmurs or gallops. Regular rate and rhythm.  Respiratory: Clear to auscultation bilaterally with equal chest rise  Abdomen: Soft, obese, nontender, nondistended, + bowel sounds  Extremities: warm dry without cyanosis clubbing or edema  Neuro: AAOx3, no focal deficits  Psych: Normal affect and demeanor with intact judgement and insight  Discharge Instructions     Medication List    TAKE these medications        aspirin 325 MG EC tablet  Take 1 tablet (325 mg total) by mouth daily.     dorzolamide 2 % ophthalmic solution  Commonly known as:  TRUSOPT  Place 1 drop into both eyes 2 (two) times daily.     hydrochlorothiazide 25 MG tablet  Commonly known as:  HYDRODIURIL  Take 25 mg by mouth daily.     nicotine 21 mg/24hr patch  Commonly known as:  NICODERM CQ - dosed in mg/24 hours  Place 1 patch (21 mg total) onto the skin daily.     ondansetron 8 MG disintegrating tablet  Commonly known as:  ZOFRAN ODT  Take 1 tablet (8 mg total) by mouth every 8 (eight) hours as needed for nausea.     oxycodone 5 MG capsule  Commonly known as:  OXY-IR  Take 1 capsule (5 mg total) by mouth every 4 (four) hours as needed.     pantoprazole 40 MG tablet  Commonly known as:  PROTONIX  Take 40 mg by mouth daily.     peg 3350 powder 100 G Solr  Commonly known as:  MOVIPREP  Take 1 kit (100 g total) by mouth once.     QUEtiapine 50 MG tablet  Commonly known as:  SEROQUEL    Take 25 mg by mouth at bedtime.     sertraline 100 MG tablet  Commonly known as:  ZOLOFT  Take 100 mg by mouth daily.     simvastatin 20 MG tablet  Commonly known as:  ZOCOR  Take 1 tablet (20 mg total) by mouth daily at 6 PM.     tetracaine 0.5 % ophthalmic solution  Commonly known as:  PONTOCAINE  Place 1 drop into both eyes once.     timolol 0.5 % ophthalmic solution  Commonly known as:  BETIMOL  Place 1 drop into both eyes 2 (two) times daily.     Vitamin D (Ergocalciferol) 50000 UNITS Caps capsule  Commonly known as:  DRISDOL  Take 50,000 Units by mouth every 7 (seven) days.       Allergies  Allergen Reactions  . Shellfish Allergy Anaphylaxis   Follow-up Information    Follow up with Primary care physician. Schedule an appointment as soon as possible for a visit in 1 week.   Why:  Hospital followup, discuss diabetes and cholesterol      Follow up with Xu,Jindong, MD. Schedule an appointment as soon as possible for a visit in 2 months.   Specialty:  Neurology   Why:  Hospital followup,  stroke clinic   Contact information:   [address] [place] [address] [phone]       Follow up with EARLY, TODD, MD. Schedule an appointment as soon as possible for a visit in 2 weeks.   Specialty:  Vascular Surgery   Why:  Hospital followup   Contact information:   [address] [place] [address] [phone]        The results of significant diagnostics from this hospitalization (including imaging, microbiology, ancillary and laboratory) are listed below for reference.    Significant Diagnostic Studies: Dg Chest 2 View  04/24/2014   CLINICAL DATA:  Stroke.  Smoking history.  History of hypertension.  EXAM: CHEST  2 VIEW  COMPARISON:  01/13/2010  FINDINGS: The heart is at the upper limits of normal in size. Mediastinal shadows are normal. The lungs are clear. The vascularity is normal. No effusions. No bony abnormalities.  IMPRESSION: No active  cardiopulmonary disease.   Electronically Signed   By: Nelson Chimes M.D.   On: 04/24/2014 21:10   Ct Angio Neck W/cm &/or Wo/cm  04/25/2014   CLINICAL DATA:  Acute left thalamic infarct. Question carotid stenosis.  EXAM: CT ANGIOGRAPHY NECK  TECHNIQUE: Multidetector CT imaging of the neck was performed using the standard protocol during bolus administration of intravenous contrast. Multiplanar CT image reconstructions and MIPs were obtained to evaluate the vascular anatomy. Carotid stenosis measurements (when applicable) are obtained utilizing NASCET criteria, using the distal internal carotid diameter as the denominator.  CONTRAST:  70m OMNIPAQUE IOHEXOL 350 MG/ML SOLN  COMPARISON:  MRI of the brain and MRA head 04/24/2014.  FINDINGS: A 3 vessel arch configuration is present. The vertebral arteries originate from the subclavian arteries bilaterally. There is mild atherosclerotic irregularity within the proximal vertebral arteries bilaterally without significant stenosis. No significant stenosis is present within the neck. They vertebral arteries are codominant. The right PICA origin is visualized and normal. The left AICA is dominant. The basilar artery is within normal limits. Both posterior cerebral arteries originate from the basilar tip.  The right common carotid artery is normal. Mild atherosclerotic changes noted within the proximal right internal carotid artery without a significant stenosis relative to the more distal vessel. There is some metal artifact across the right carotid artery from dental work.  The intracranial right ICA demonstrates atherosclerotic irregularity within the cavernous segment without a significant stenosis.  The left common carotid artery is within normal limits. Atherosclerotic scratch the calcified and noncalcified plaque is present in the proximal left ICA. The minimal luminal diameter is 2.6 mm, without significant stenosis relative to the more distal vessel. Atherosclerotic  calcifications are again seen within the cavernous left internal carotid artery without a significant stenosis. The A1 and M1 segments are normal. The MCA bifurcations are within normal limits.  Mildly enlarged level 2 lymph nodes bilaterally are likely reactive, ovoid in shape. The soft tissues demonstrate no focal mucosal or submucosal lesions. The lung apices are clear. Endplate changes and ossification of posterior longitudinal ligament contribute to central canal narrowing within the mid and low or cervical spine. Uncovertebral spurring is evident without significant foraminal narrowing apart from left-sided disease at C5-6.  Review of the MIP images confirms the above findings.  IMPRESSION: 1. Mild narrowing of the proximal left ICA without a significant stenosis relative to the more distal vessel. 2. Atherosclerotic changes at the right carotid bifurcation without significant stenosis. 3. Minimal atherosclerotic disease at the proximal vertebral arteries bilaterally without significant  stenosis. 4. The visualized circle of Willis is unremarkable. 5. Atherosclerotic changes within the cavernous internal carotid arteries bilaterally without significant stenosis. 6. Moderate spondylosis in the mid and lower cervical spine.   Electronically Signed   By: Lawrence Santiago M.D.   On: 04/25/2014 14:07   Mr Jodene Nam Head Wo Contrast  04/24/2014   CLINICAL DATA:  Right-sided numbness. Progressive symptoms over the last several days. Symptoms began after the a flu vaccine.  EXAM: MRI HEAD WITHOUT CONTRAST  MRA HEAD WITHOUT CONTRAST  TECHNIQUE: Multiplanar, multiecho pulse sequences of the brain and surrounding structures were obtained without intravenous contrast. Angiographic images of the head were obtained using MRA technique without contrast.  COMPARISON:  None.  FINDINGS: MRI HEAD FINDINGS  An acute nonhemorrhagic infarct is present within the left thalamus measuring 10 mm maximally. The study is mildly degraded by  patient motion. Periventricular white matter changes are greater than expected for age. Scattered subcortical T2 hyperintensities are present as well.  The ventricles are of normal size. No significant extraaxial fluid collection is present. No acute hemorrhage or mass lesion is present. Midline structures are within normal limits.  Flow is present in the major intracranial arteries. The patient is status post bilateral lens extractions. The globes and orbits are otherwise intact.  There is diffuse opacification of the left nasal cavity there despite this, the a left ethmoid and maxillary sinus are clear. The left frontal sinus is opacified. The remaining paranasal sinuses and the mastoid air cells are clear.  MRA HEAD FINDINGS  Atherosclerotic irregularity is present within the cavernous internal carotid arteries bilaterally. There is no significant stenosis. The A1 and M1 segments are normal. The left A1 is dominant. The anterior communicating artery is patent. The MCA bifurcations are intact bilaterally. Distal small vessel attenuation is evident bilaterally, left greater than right. There is no significant proximal occlusion.  The vertebral arteries are codominant. The right PICA origin is just below the field of view. The left AICA is dominant. There moderate stenoses in the proximal posterior cerebral arteries bilaterally. The basilar artery demonstrates mild atherosclerotic irregularity without significant stenosis. There is significant attenuation of distal PCA branch vessels bilaterally.  IMPRESSION: 1. Acute nonhemorrhagic infarct within the left thalamus. 2. Periventricular and subcortical white matter changes are somewhat advanced for age. This likely reflects the sequelae of chronic microvascular ischemia. 3. Soft tissue mass or polyp within the left nasal cavity. Recommend direct visualization 4. Obstruction of the left frontal sinus may be related to the nasal mass. 5. Mild to moderate medium and  small vessel disease bilaterally, most evident in the left MCA distribution and bilateral posterior cerebral arteries. 6. Moderate stenoses of the proximal posterior cerebral arteries bilaterally. 7. No other significant proximal stenosis, aneurysm, or branch vessel occlusion.   Electronically Signed   By: Lawrence Santiago M.D.   On: 04/24/2014 16:50   Mr Brain Wo Contrast  04/24/2014   CLINICAL DATA:  Right-sided numbness. Progressive symptoms over the last several days. Symptoms began after the a flu vaccine.  EXAM: MRI HEAD WITHOUT CONTRAST  MRA HEAD WITHOUT CONTRAST  TECHNIQUE: Multiplanar, multiecho pulse sequences of the brain and surrounding structures were obtained without intravenous contrast. Angiographic images of the head were obtained using MRA technique without contrast.  COMPARISON:  None.  FINDINGS: MRI HEAD FINDINGS  An acute nonhemorrhagic infarct is present within the left thalamus measuring 10 mm maximally. The study is mildly degraded by patient motion. Periventricular white matter changes are greater  than expected for age. Scattered subcortical T2 hyperintensities are present as well.  The ventricles are of normal size. No significant extraaxial fluid collection is present. No acute hemorrhage or mass lesion is present. Midline structures are within normal limits.  Flow is present in the major intracranial arteries. The patient is status post bilateral lens extractions. The globes and orbits are otherwise intact.  There is diffuse opacification of the left nasal cavity there despite this, the a left ethmoid and maxillary sinus are clear. The left frontal sinus is opacified. The remaining paranasal sinuses and the mastoid air cells are clear.  MRA HEAD FINDINGS  Atherosclerotic irregularity is present within the cavernous internal carotid arteries bilaterally. There is no significant stenosis. The A1 and M1 segments are normal. The left A1 is dominant. The anterior communicating artery is  patent. The MCA bifurcations are intact bilaterally. Distal small vessel attenuation is evident bilaterally, left greater than right. There is no significant proximal occlusion.  The vertebral arteries are codominant. The right PICA origin is just below the field of view. The left AICA is dominant. There moderate stenoses in the proximal posterior cerebral arteries bilaterally. The basilar artery demonstrates mild atherosclerotic irregularity without significant stenosis. There is significant attenuation of distal PCA branch vessels bilaterally.  IMPRESSION: 1. Acute nonhemorrhagic infarct within the left thalamus. 2. Periventricular and subcortical white matter changes are somewhat advanced for age. This likely reflects the sequelae of chronic microvascular ischemia. 3. Soft tissue mass or polyp within the left nasal cavity. Recommend direct visualization 4. Obstruction of the left frontal sinus may be related to the nasal mass. 5. Mild to moderate medium and small vessel disease bilaterally, most evident in the left MCA distribution and bilateral posterior cerebral arteries. 6. Moderate stenoses of the proximal posterior cerebral arteries bilaterally. 7. No other significant proximal stenosis, aneurysm, or branch vessel occlusion.   Electronically Signed   By: Lawrence Santiago M.D.   On: 04/24/2014 16:50    Microbiology: No results found for this or any previous visit (from the past 240 hour(s)).   Labs: Basic Metabolic Panel:  Recent Labs Lab 04/24/14 1128 04/24/14 1137 04/25/14 0458  NA 139 139 143  K 3.5* 3.4* 4.2  CL 93* 93* 100  CO2 32  --  30  GLUCOSE 205* 214* 179*  BUN '13 13 14  ' CREATININE 0.68 0.80 0.66  CALCIUM 10.5  --  10.1   Liver Function Tests:  Recent Labs Lab 04/24/14 1128  AST 20  ALT 26  ALKPHOS 135*  BILITOT 0.3  PROT 8.3  ALBUMIN 3.9   No results for input(s): LIPASE, AMYLASE in the last 168 hours. No results for input(s): AMMONIA in the last 168  hours. CBC:  Recent Labs Lab 04/24/14 1128 04/24/14 1137 04/25/14 0458  WBC 13.6*  --  10.9*  NEUTROABS 9.6*  --   --   HGB 15.2* 17.0* 14.3  HCT 46.6* 50.0* 44.4  MCV 91.9  --  92.5  PLT 321  --  269   Cardiac Enzymes: No results for input(s): CKTOTAL, CKMB, CKMBINDEX, TROPONINI in the last 168 hours. BNP: BNP (last 3 results) No results for input(s): PROBNP in the last 8760 hours. CBG:  Recent Labs Lab 04/24/14 2307 04/25/14 0910 04/25/14 1359  GLUCAP 218* 239* 164*    Signed:  Cristal Ford  [facility] 04/25/2014, 2:52 PM

## 2014-04-25 NOTE — Progress Notes (Signed)
PT Cancellation Note  Patient Details Name: FLETA BORGESON MRN: [id number] DOB: 1960/09/19   Cancelled Treatment:    Reason Eval/Treat Not Completed: Patient at procedure or test/unavailable. Pt off unit for CT scan at this time. Will check back as schedule allows to complete PT eval.    Rolinda Roan 04/25/2014, 1:36 PM  Rolinda Roan, PT, DPT Acute Rehabilitation Services Pager: [phone]

## 2014-04-25 NOTE — Progress Notes (Signed)
Patient discharge and discharge instruction and stroke education completed. IV removed and intact, Pt d.c withskin intact. Patient verbalized understanding of meds, f/u appointments, and stoke education.

## 2014-04-25 NOTE — Progress Notes (Signed)
STROKE TEAM PROGRESS NOTE   HISTORY Susan Farley is an 53 y.o. female with a history of hypertension, hypercholesterolemia and retinitis pigmentosa who came to the emergency room for evaluation of numbness involving the right side which started 9 days ago. Numbness involving face arm and leg. She had no weakness and no changes in speech or swallowing. She had received a flu shot about 5 days prior to onset of numbness. At this point she is only experiencing slight numbness involving her neck and back as well as anterior right thigh.Marland Kitchen NIH stroke score was 0. 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  LSN: 04/15/2014  Patient was not administered TPA secondary to beyond time window for treatment consideration. She was admitted for further evaluation and treatment.   SUBJECTIVE (INTERVAL HISTORY) No family is at the bedside.  Overall she feels her condition is rapidly improving. She still feel some numbness at right hand and right neck but on exam she felt symmtrical on light touch and pain prick. She has no other complains.  OBJECTIVE     Recent Labs Lab 04/24/14 2307 04/25/14 0910 04/25/14 1359  GLUCAP 218* 239* 164*    Recent Labs Lab 04/24/14 1128 04/24/14 1137 04/25/14 0458  NA 139 139 143  K 3.5* 3.4* 4.2  CL 93* 93* 100  CO2 32  --  30  GLUCOSE 205* 214* 179*  BUN 13 13 14   CREATININE 0.68 0.80 0.66  CALCIUM 10.5  --  10.1    Recent Labs Lab 04/24/14 1128  AST 20  ALT 26  ALKPHOS 135*  BILITOT 0.3  PROT 8.3  ALBUMIN 3.9    Recent Labs Lab 04/24/14 1128 04/24/14 1137 04/25/14 0458  WBC 13.6*  --  10.9*  NEUTROABS 9.6*  --   --   HGB 15.2* 17.0* 14.3  HCT 46.6* 50.0* 44.4  MCV 91.9  --  92.5  PLT 321  --  269   No results for input(s): CKTOTAL, CKMB, CKMBINDEX, TROPONINI in the last 168 hours. No results for input(s): LABPROT, INR in the last 72 hours. No results for input(s): COLORURINE, LABSPEC, [place], GLUCOSEU, HGBUR,  BILIRUBINUR, KETONESUR, PROTEINUR, UROBILINOGEN, NITRITE, LEUKOCYTESUR in the last 72 hours.  Invalid input(s): APPERANCEUR     Component Value Date/Time   CHOL 283* 04/25/2014 0458   TRIG 585* 04/25/2014 0458   HDL 35* 04/25/2014 0458   CHOLHDL 8.1 04/25/2014 0458   VLDL UNABLE TO CALCULATE IF TRIGLYCERIDE OVER 400 mg/dL 04/25/2014 0458   LDLCALC UNABLE TO CALCULATE IF TRIGLYCERIDE OVER 400 mg/dL 04/25/2014 0458   Lab Results  Component Value Date   HGBA1C 7.8* 04/25/2014      Component Value Date/Time   LABOPIA NONE DETECTED 04/24/2014 1128   COCAINSCRNUR NONE DETECTED 04/24/2014 1128   LABBENZ NONE DETECTED 04/24/2014 1128   AMPHETMU NONE DETECTED 04/24/2014 1128   THCU NONE DETECTED 04/24/2014 1128   LABBARB NONE DETECTED 04/24/2014 1128     Recent Labs Lab 04/24/14 1128  ETH <11    Ct Angio Neck W/cm &/or Wo/cm  04/25/2014   IMPRESSION: 1. Mild narrowing of the proximal left ICA without a significant stenosis relative to the more distal vessel. 2. Atherosclerotic changes at the right carotid bifurcation without significant stenosis. 3. Minimal atherosclerotic disease at the proximal vertebral arteries bilaterally without significant stenosis. 4. The visualized circle of Willis is unremarkable. 5. Atherosclerotic changes within the cavernous internal carotid arteries bilaterally without significant stenosis. 6. Moderate spondylosis in  the mid and lower cervical spine.    2D Echocardiogram  EF 65-70% with no source of embolus.  Carotid Doppler  1-39% right internal carotid artery stenosis and low range 60-79% left internal carotid artery stenosis. Vertebral arteries are patent with antegrade flow.  Dg Chest 2 View  04/24/2014    IMPRESSION: No active cardiopulmonary disease.     Mri and Mra Head Wo Contrast  04/24/2014     IMPRESSION: 1. Acute nonhemorrhagic infarct within the left thalamus. 2. Periventricular and subcortical white matter changes are somewhat advanced  for age. This likely reflects the sequelae of chronic microvascular ischemia. 3. Soft tissue mass or polyp within the left nasal cavity. Recommend direct visualization 4. Obstruction of the left frontal sinus may be related to the nasal mass. 5. Mild to moderate medium and small vessel disease bilaterally, most evident in the left MCA distribution and bilateral posterior cerebral arteries. 6. Moderate stenoses of the proximal posterior cerebral arteries bilaterally. 7. No other significant proximal stenosis, aneurysm, or branch vessel occlusion.     CUS - Findings suggest 1-39% right internal carotid artery stenosis and low range 60-79% left internal carotid artery stenosis. Vertebral arteries are patent with antegrade flow.  2D echo - Left ventricle: The cavity size was normal. Wall thickness was increased in a pattern of mild LVH. Systolic function was vigorous. The estimated ejection fraction was in the range of 65% to 70%. Wall motion was normal; there were no regional wall motion abnormalities.  PHYSICAL EXAM General - Well nourished, well developed, in no apparent distress.  Ophthalmologic - Sharp disc margins OU.  Cardiovascular - Regular rate and rhythm with no murmur.  Mental Status -  Level of arousal and orientation to time, place, and person were intact. Language including expression, naming, repetition, comprehension was assessed and found intact.  Cranial Nerves II - XII - II - Visual field intact OU. III, IV, VI - Extraocular movements intact. V - Facial sensation intact bilaterally. VII - Facial movement intact bilaterally. VIII - Hearing & vestibular intact bilaterally. X - Palate elevates symmetrically. XI - Chin turning & shoulder shrug intact bilaterally. XII - Tongue protrusion intact.  Motor Strength - The patient's strength was normal in all extremities and pronator drift was absent.  Bulk was normal and fasciculations were absent.   Motor Tone - Muscle  tone was assessed at the neck and appendages and was normal.  Reflexes - The patient's reflexes were normal in all extremities and she had no pathological reflexes.  Sensory - Light touch, temperature/pinprick were assessed and were normal.    Coordination - The patient had normal movements in the hands and feet with no ataxia or dysmetria.  Tremor was absent.  Gait and Station - The patient's transfers, posture, gait, station, and turns were observed as normal.   ASSESSMENT/PLAN Susan Farley is a 53 y.o. female with history of hypertension, hypercholesterolemia and retinitis pigmentosa presenting with numbness involving the right side which started 9 days ago. She did not receive IV t-PA due to delay in arrival.   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  Resultant  Mild right subjective numbness  MRI  L thalamic infarct, small vessel disease   MRA  No significant stenosis   Carotid Doppler  L 60-79% stenosis  CTA of the neck showed about 50% stenosis at left ICA. No intervention needed at this time. Agree to follow up with  vascular surgery at outpt  2D Echo  No source of embolus   HgbA1c 7.8 not at goal  Lovenox 40 mg sq daily for VTE prophylaxis    thin liquids  no antithrombotics prior to admission, now on aspirin 325 mg orally every day  Patient counseled to be compliant with her antithrombotic medications  Ongoing aggressive risk factor management  Hypertension  Home meds:   hctz BP goal normotensive now as her stroke is a week out. Patient counseled to be compliant with her blood pressure medications.  Hyperlipidemia  Home meds:  No statin  LDL unable to calculate, not at goal < 70  Add statin simvastatin for HLD  Continue statin at discharge. Follow up with PCP for further HLD management.  DM, uncontrolled  - A1C 7.8, not at the goal - treatment as per primary team - follow  up with PCP - goal < 6.5  Other Stroke Risk Factors . Cigarette smoker, advised to stop smoking  . Morbid Obesity, Body mass index is 40.4 kg/(m^2).   Obstructive sleep apnea, need outpt follow up  Other Active Problems  Leukocytosis, felt to be reactive, medicine is following  Hypokalemia secondary to diuretics, medicine following  Other Pertinent History  Retinitis pigmentosa  Depression  GERD  Hospital day # 1   Neurology will sign off. Please call with questions. Pt will follow up with Dr. Erlinda Hong at [facility] in about 2 months. Thanks for the consult.  Rosalin Hawking, MD PhD Stroke Neurology 04/27/2014 11:48 AM     To contact Stroke Continuity provider, please refer to [email or web address] After hours, contact General Neurology

## 2014-04-25 NOTE — Care Management Note (Unsigned)
    Page 1 of 1   04/25/2014     11:29:46 AM CARE MANAGEMENT NOTE 04/25/2014  Patient:  Susan Farley, Susan Farley   Account Number:  [id number]  Date Initiated:  04/25/2014  Documentation initiated by:  Tomi Bamberger  Subjective/Objective Assessment:   dx cva  admit- lives alone.     Action/Plan:   pt eval-   Anticipated DC Date:  04/25/2014   Anticipated DC Plan:  [place]  CM consult      Choice offered to / List presented to:             Status of service:  In process, will continue to follow Medicare Important Message given?  NA - LOS <3 / Initial given by admissions (If response is "NO", the following Medicare IM given date fields will be blank) Date Medicare IM given:   Medicare IM given by:   Date Additional Medicare IM given:   Additional Medicare IM given by:    Discharge Disposition:    Per UR Regulation:  Reviewed for med. necessity/level of care/duration of stay  If discussed at [place] of Stay Meetings, dates discussed:    Comments:  04/25/14 [place] RN,BSN [phone] patient is for possible dc today, await pt eval.  NCM will continue to follow for dc needs.

## 2014-04-25 NOTE — Progress Notes (Signed)
  Echocardiogram 2D Echocardiogram has been performed.  Susan Farley FRANCES 04/25/2014, 9:39 AM

## 2014-04-25 NOTE — Progress Notes (Signed)
OT Cancellation Note  Patient Details Name: Susan Farley MRN: [id number] DOB: Dec 22, 1960   Cancelled Treatment:    Reason Eval/Treat Not Completed: Patient at procedure or test/ unavailable (CT scan).  Will attempt again this afternoon.  [place], [place] 04/25/2014, 1:22 PM

## 2014-04-26 DIAGNOSIS — E118 Type 2 diabetes mellitus with unspecified complications: Secondary | ICD-10-CM

## 2014-04-26 NOTE — Progress Notes (Signed)
Patient's HbA1c returned at 7.8.  She is now a newly diagnosed diabetic.  Called in Metformin 500mg  BID to her pharmacy.  Spoke with patient regarding carb modified diet and the need for yearly eye exams.  Patient is to start her medication on 11/8 (due to contrast received on 11/6).   Time spent: 15 minutes  Shem Plemmons D.O. [facility] Pager [phone]  If 7PM-7AM, please contact night-coverage [email or web address] Password [facility] 04/26/2014, 4:27 PM

## 2014-04-27 ENCOUNTER — Other Ambulatory Visit: Payer: Self-pay | Admitting: Neurology

## 2014-04-27 DIAGNOSIS — I6381 Other cerebral infarction due to occlusion or stenosis of small artery: Secondary | ICD-10-CM

## 2014-04-27 DIAGNOSIS — I63212 Cerebral infarction due to unspecified occlusion or stenosis of left vertebral arteries: Secondary | ICD-10-CM

## 2014-04-27 DIAGNOSIS — E1159 Type 2 diabetes mellitus with other circulatory complications: Secondary | ICD-10-CM | POA: Insufficient documentation

## 2014-04-27 DIAGNOSIS — I6322 Cerebral infarction due to unspecified occlusion or stenosis of basilar arteries: Principal | ICD-10-CM

## 2014-04-28 ENCOUNTER — Telehealth: Payer: Self-pay | Admitting: Vascular Surgery

## 2014-04-28 NOTE — Telephone Encounter (Addendum)
-----   Message from Coralee North, RN sent at 04/25/2014  2:35 PM EST ----- Will you please schedule this patient for an office visit with Dr. Donnetta Hutching within the next 1-2 weeks? She is being discharged today from [facility] with an acute CVA/ right-sided numbness. Thank you!  Colletta [place]  04/28/14: spoke with pt to schedule appt for 11/17 @ 1130am, dpm

## 2014-05-05 ENCOUNTER — Encounter: Payer: Self-pay | Admitting: Vascular Surgery

## 2014-05-06 ENCOUNTER — Ambulatory Visit (INDEPENDENT_AMBULATORY_CARE_PROVIDER_SITE_OTHER): Payer: Federal, State, Local not specified - PPO | Admitting: Vascular Surgery

## 2014-05-06 ENCOUNTER — Encounter: Payer: Self-pay | Admitting: Vascular Surgery

## 2014-05-06 VITALS — BP 136/71 | HR 84 | Ht 64.0 in | Wt 235.6 lb

## 2014-05-06 DIAGNOSIS — I639 Cerebral infarction, unspecified: Secondary | ICD-10-CM

## 2014-05-06 NOTE — Progress Notes (Signed)
Patient name: Susan Farley MRN: [id number] DOB: 1960-10-22 Sex: female     Reason for referral:  Chief Complaint  Patient presents with  . New Evaluation    1-2 wk f/u s/p acute CTA R sided numbness    HISTORY OF PRESENT ILLNESS: Patient is a 53 year old female who was admitted to [facility] 11 5 with diagnosis of left thalamic infarct. She presented with numbness on her right face arm and leg. Patient today recalls mainly just neck discomfort. Evaluation included carotid duplex suggesting moderate left carotid stenosis. She had a CT angiogram of her neck for further evaluation of this for review as well. She had no prior events aside for some ongoing right neck soreness has no residual symptoms. She is a long-term cigarette smoker and is trying to cut back. No history of cardiac difficulty  Past Medical History  Diagnosis Date  . Hypertension   . Hypercholesteremia   . Retinitis pigmentosa   . Colitis     Past Surgical History  Procedure Laterality Date  . Cataract extraction    . Oophorectomy    . Bone graft hip iliac crest    . Iud removal      History   Social History  . Marital Status: Married    Spouse Name: N/A    Number of Children: N/A  . Years of Education: N/A   Occupational History  . Retired  [employer]   Social History Main Topics  . Smoking status: Current Every Day Smoker -- 1.50 packs/day  . Smokeless tobacco: Never Used  . Alcohol Use: No  . Drug Use: No  . Sexual Activity: Not on file   Other Topics Concern  . Not on file   Social History Narrative    Family History  Problem Relation Age of Onset  . Adopted: Yes  . Family history unknown: Yes    Allergies as of 05/06/2014 - Review Complete 05/06/2014  Allergen Reaction Noted  . Shellfish allergy Anaphylaxis 11/29/2011    Current Outpatient Prescriptions on File Prior to Visit  Medication Sig Dispense Refill  . aspirin EC 325 MG EC tablet Take 1 tablet (325 mg  total) by mouth daily. 30 tablet 0  . dorzolamide (TRUSOPT) 2 % ophthalmic solution Place 1 drop into both eyes 2 (two) times daily.    . hydrochlorothiazide (HYDRODIURIL) 25 MG tablet Take 25 mg by mouth daily.    . nicotine (NICODERM CQ - DOSED IN MG/24 HOURS) 21 mg/24hr patch Place 1 patch (21 mg total) onto the skin daily. 28 patch 0  . ondansetron (ZOFRAN ODT) 8 MG disintegrating tablet Take 1 tablet (8 mg total) by mouth every 8 (eight) hours as needed for nausea. 20 tablet 0  . oxycodone (OXY-IR) 5 MG capsule Take 1 capsule (5 mg total) by mouth every 4 (four) hours as needed. 10 capsule 0  . pantoprazole (PROTONIX) 40 MG tablet Take 40 mg by mouth daily.     . peg 3350 powder (MOVIPREP) 100 G SOLR Take 1 kit (100 g total) by mouth once. 1 kit 0  . QUEtiapine (SEROQUEL) 50 MG tablet Take 25 mg by mouth at bedtime.    . sertraline (ZOLOFT) 100 MG tablet Take 100 mg by mouth daily.    . simvastatin (ZOCOR) 20 MG tablet Take 1 tablet (20 mg total) by mouth daily at 6 PM. 30 tablet 0  . tetracaine (PONTOCAINE) 0.5 % ophthalmic solution Place 1 drop into both  eyes once.    . timolol (BETIMOL) 0.5 % ophthalmic solution Place 1 drop into both eyes 2 (two) times daily.    . Vitamin D, Ergocalciferol, (DRISDOL) 50000 UNITS CAPS capsule Take 50,000 Units by mouth every 7 (seven) days.     No current facility-administered medications on file prior to visit.     REVIEW OF SYSTEMS:  Positives indicated with an "X"  CARDIOVASCULAR:  _0  chest pain   _1  chest pressure   _2  palpitations   _3  orthopnea   _4  dyspnea on exertion   _5  claudication   _6  rest pain   _7  DVT   _8  phlebitis PULMONARY:   _9  productive cough   _10  asthma   _11  wheezing NEUROLOGIC:   [x ] weakness  [x ] paresthesias  _12  aphasia  _13  amaurosis  _14  dizziness HEMATOLOGIC:   _15  bleeding problems   _16  clotting disorders MUSCULOSKELETAL:  _17  joint pain   _18  joint swelling GASTROINTESTINAL: _19   blood in stool  _20    hematemesis GENITOURINARY:  _21   dysuria  _22   hematuria PSYCHIATRIC:  _23  history of major depression INTEGUMENTARY:  _24  rashes  _25  ulcers CONSTITUTIONAL:  _26  fever   _27  chills  PHYSICAL EXAMINATION:  General: The patient is a well-nourished female, in no acute distress. Vital signs are BP 136/71 mmHg  Pulse 84  Ht _28  (1.626 m)  Wt 235 lb 9.6 oz (106.867 kg)  BMI 40.42 kg/m2  SpO2 98%  LMP 09/27/2011 Pulmonary: There is a good air exchange bilaterally without wheezing or rales.  Musculoskeletal: There are no major deformities.  There is no significant extremity pain. Neurologic: No focal weakness or paresthesias are detected, Skin: There are no ulcer or rashes noted. Psychiatric: The patient has normal affect. Cardiovascular: There is a regular rate and rhythm without significant murmur appreciated. Soft right carotid bruit and no left carotid bruit 2+ radial pulses bilaterally   I did review her CT angiogram and this does show moderate approximately 50% stenosis.  Impression and Plan:  Asymptomatic moderate 50% left internal carotid artery stenosis. I discussed the significance of this with the patient and explain symptoms of left carotid disease. I do not feel that this has any relationship to her prior events or left thalamic infarct. Would recommend that we see her again in one year with repeat carotid duplex to rule out progression of her asymptomatic disease. I did discuss the critical importance of cigarette smoking cessation as the leading cause for her atherosclerotic disease. She will see [place] again in one year    Deryck Hippler [facility] [place] Office: [phone]

## 2014-05-06 NOTE — Addendum Note (Signed)
Addended by: Mena Goes on: 05/06/2014 02:37 PM   Modules accepted: Orders

## 2014-05-08 ENCOUNTER — Ambulatory Visit (INDEPENDENT_AMBULATORY_CARE_PROVIDER_SITE_OTHER): Payer: Federal, State, Local not specified - PPO | Admitting: Neurology

## 2014-05-08 ENCOUNTER — Encounter: Payer: Self-pay | Admitting: Neurology

## 2014-05-08 VITALS — BP 131/79 | HR 87 | Ht 66.25 in | Wt 234.2 lb

## 2014-05-08 DIAGNOSIS — E785 Hyperlipidemia, unspecified: Secondary | ICD-10-CM

## 2014-05-08 DIAGNOSIS — I639 Cerebral infarction, unspecified: Secondary | ICD-10-CM

## 2014-05-08 DIAGNOSIS — E1159 Type 2 diabetes mellitus with other circulatory complications: Secondary | ICD-10-CM

## 2014-05-08 MED ORDER — METFORMIN HCL 1000 MG PO TABS: 1000.0000 mg | ORAL_TABLET | Freq: Two times a day (BID) | ORAL | Status: DC

## 2014-05-08 MED ORDER — GEMFIBROZIL 600 MG PO TABS: 600.0000 mg | ORAL_TABLET | Freq: Two times a day (BID) | ORAL | Status: DC

## 2014-05-08 MED ORDER — GABAPENTIN 100 MG PO CAPS
100.0000 mg | ORAL_CAPSULE | Freq: Three times a day (TID) | ORAL | Status: DC
Start: 2014-05-08 — End: 2014-08-11

## 2014-05-08 NOTE — Patient Instructions (Addendum)
-   continue ASA and simvastatin for stroke prevention - add lopid twice a day for high lipid profile - increased metformin to 1000mg  twice a day for DM control - add Neurontin for left sided numbness. - check BP and glucose at home and record and bring over to [place] doctor - Follow up with your [facility] primary care [facility] for stroke risk factor modification. Recommend maintain blood pressure goal <130/80, diabetes with hemoglobin A1c goal below 6.5% and lipids with LDL cholesterol goal below 70 mg/dL.  - quit smoking is very important. - follow up in 3 months.

## 2014-05-08 NOTE — Progress Notes (Signed)
STROKE NEUROLOGY FOLLOW UP NOTE  NAME: Susan Farley DOB: Nov 02, 1960  REASON FOR VISIT: stroke follow up HISTORY FROM: chart and pt  Today we had the pleasure of seeing Susan Farley in follow-up at our [facility]rology [facility]. Pt was accompanied by no one.   History Summary Susan Farley is an 53 y.o. female with a history of HTN, HLD, DM, smoker and retinitis pigmentosa was admitted to [facility] on 04/24/14 for 9 day hx of numbness involving the right side. Numbness involving face arm and leg. She had no weakness and no changes in speech or swallowing. 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 She is heavy smoker. 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 CTA showed 50% narrowing or left ICA no significant stenosis. She was discharged with ASA and zocor.   Interval History During the interval time, the patient has been doing the same. She still complains of left sided numbness and not much better. She went to see Dr. Donnetta Hutching on 05/06/14 and was consider medical management of left ICA stenosis and repeat CUS in one year. She is still smoking. Her glucose at home around 190-200s.  REVIEW OF SYSTEMS: Full 14 system review of systems performed and notable only for those listed below and in HPI above, all others are negative:  Constitutional: N/A  Cardiovascular: murmur  Ear/Nose/Throat: N/A  Skin: N/A  Eyes: N/A  Respiratory: cough  Gastroitestinal: N/A  Genitourinary: N/A Hematology/Lymphatic: N/A  Endocrine: N/A  Musculoskeletal: N/A  Allergy/Immunology: food allergies  Neurological: Numbness  Psychiatric: apnea during sleep, daytime sleepiness, snoring  The following represents the patient's updated allergies and side effects list: Allergies  Allergen Reactions  . Shellfish Allergy Anaphylaxis    Labs since last visit of relevance include the following: Results for orders placed or performed during the hospital encounter of 04/24/14  Ethanol  Result Value  Ref Range   Alcohol, Ethyl (B) <11 0 - 11 mg/dL  Protime-INR  Result Value Ref Range   Prothrombin Time 12.7 11.6 - 15.2 seconds   INR 0.95 0.00 - 1.49  APTT  Result Value Ref Range   aPTT 28 24 - 37 seconds  CBC  Result Value Ref Range   WBC 13.6 (H) 4.0 - 10.5 K/uL   RBC 5.07 3.87 - 5.11 MIL/uL   Hemoglobin 15.2 (H) 12.0 - 15.0 g/dL   HCT 46.6 (H) 36.0 - 46.0 %   MCV 91.9 78.0 - 100.0 fL   MCH 30.0 26.0 - 34.0 pg   MCHC 32.6 30.0 - 36.0 g/dL   RDW 15.6 (H) 11.5 - 15.5 %   Platelets 321 150 - 400 K/uL  Differential  Result Value Ref Range   Neutrophils Relative % 71 43 - 77 %   Neutro Abs 9.6 (H) 1.7 - 7.7 K/uL   Lymphocytes Relative 23 12 - 46 %   Lymphs Abs 3.2 0.7 - 4.0 K/uL   Monocytes Relative 5 3 - 12 %   Monocytes Absolute 0.6 0.1 - 1.0 K/uL   Eosinophils Relative 1 0 - 5 %   Eosinophils Absolute 0.2 0.0 - 0.7 K/uL   Basophils Relative 0 0 - 1 %   Basophils Absolute 0.0 0.0 - 0.1 K/uL  Comprehensive metabolic panel  Result Value Ref Range   Sodium 139 137 - 147 mEq/L   Potassium 3.5 (L) 3.7 - 5.3 mEq/L   Chloride 93 (L) 96 - 112 mEq/L   CO2 32 19 - 32 mEq/L  Glucose, Bld 205 (H) 70 - 99 mg/dL   BUN 13 6 - 23 mg/dL   Creatinine, Ser 0.68 0.50 - 1.10 mg/dL   Calcium 10.5 8.4 - 10.5 mg/dL   Total Protein 8.3 6.0 - 8.3 g/dL   Albumin 3.9 3.5 - 5.2 g/dL   AST 20 0 - 37 U/L   ALT 26 0 - 35 U/L   Alkaline Phosphatase 135 (H) 39 - 117 U/L   Total Bilirubin 0.3 0.3 - 1.2 mg/dL   GFR calc non Af Amer >90 >90 mL/min   GFR calc Af Amer >90 >90 mL/min   Anion gap 14 5 - 15  Urine Drug Screen  Result Value Ref Range   Opiates NONE DETECTED NONE DETECTED   Cocaine NONE DETECTED NONE DETECTED   Benzodiazepines NONE DETECTED NONE DETECTED   Amphetamines NONE DETECTED NONE DETECTED   Tetrahydrocannabinol NONE DETECTED NONE DETECTED   Barbiturates NONE DETECTED NONE DETECTED  Urinalysis, Routine w reflex microscopic  Result Value Ref Range   Color, Urine YELLOW  YELLOW   APPearance CLEAR CLEAR   Specific Gravity, Urine 1.022 1.005 - 1.030   pH 5.0 5.0 - 8.0   Glucose, UA NEGATIVE NEGATIVE mg/dL   Hgb urine dipstick NEGATIVE NEGATIVE   Bilirubin Urine NEGATIVE NEGATIVE   Ketones, ur NEGATIVE NEGATIVE mg/dL   Protein, ur NEGATIVE NEGATIVE mg/dL   Urobilinogen, UA 0.2 0.0 - 1.0 mg/dL   Nitrite NEGATIVE NEGATIVE   Leukocytes, UA NEGATIVE NEGATIVE  Hemoglobin A1c  Result Value Ref Range   Hgb A1c MFr Bld 7.8 (H) <5.7 %   Mean Plasma Glucose 177 (H) <117 mg/dL  Lipid panel  Result Value Ref Range   Cholesterol 283 (H) 0 - 200 mg/dL   Triglycerides 585 (H) <150 mg/dL   HDL 35 (L) >39 mg/dL   Total CHOL/HDL Ratio 8.1 RATIO   VLDL UNABLE TO CALCULATE IF TRIGLYCERIDE OVER 400 mg/dL 0 - 40 mg/dL   LDL Cholesterol UNABLE TO CALCULATE IF TRIGLYCERIDE OVER 400 mg/dL 0 - 99 mg/dL  CBC  Result Value Ref Range   WBC 10.9 (H) 4.0 - 10.5 K/uL   RBC 4.80 3.87 - 5.11 MIL/uL   Hemoglobin 14.3 12.0 - 15.0 g/dL   HCT 44.4 36.0 - 46.0 %   MCV 92.5 78.0 - 100.0 fL   MCH 29.8 26.0 - 34.0 pg   MCHC 32.2 30.0 - 36.0 g/dL   RDW 15.7 (H) 11.5 - 15.5 %   Platelets 269 150 - 400 K/uL  Basic metabolic panel  Result Value Ref Range   Sodium 143 137 - 147 mEq/L   Potassium 4.2 3.7 - 5.3 mEq/L   Chloride 100 96 - 112 mEq/L   CO2 30 19 - 32 mEq/L   Glucose, Bld 179 (H) 70 - 99 mg/dL   BUN 14 6 - 23 mg/dL   Creatinine, Ser 0.66 0.50 - 1.10 mg/dL   Calcium 10.1 8.4 - 10.5 mg/dL   GFR calc non Af Amer >90 >90 mL/min   GFR calc Af Amer >90 >90 mL/min   Anion gap 13 5 - 15  Glucose, capillary  Result Value Ref Range   Glucose-Capillary 218 (H) 70 - 99 mg/dL   Comment 1 Documented in Chart    Comment 2 Notify RN   Glucose, capillary  Result Value Ref Range   Glucose-Capillary 239 (H) 70 - 99 mg/dL  Glucose, capillary  Result Value Ref Range   Glucose-Capillary 164 (H) 70 -  99 mg/dL   Comment 1 Documented in Chart    Comment 2 Notify RN   I-Stat Chem 8, ED    Result Value Ref Range   Sodium 139 137 - 147 mEq/L   Potassium 3.4 (L) 3.7 - 5.3 mEq/L   Chloride 93 (L) 96 - 112 mEq/L   BUN 13 6 - 23 mg/dL   Creatinine, Ser 0.80 0.50 - 1.10 mg/dL   Glucose, Bld 214 (H) 70 - 99 mg/dL   Calcium, Ion 1.24 (H) 1.12 - 1.23 mmol/L   TCO2 30 0 - 100 mmol/L   Hemoglobin 17.0 (H) 12.0 - 15.0 g/dL   HCT 50.0 (H) 36.0 - 46.0 %  I-stat troponin, ED (not at [facility])  Result Value Ref Range   Troponin i, poc 0.00 0.00 - 0.08 ng/mL   Comment 3            The neurologically relevant items on the patient's problem list were reviewed on today's visit.  Neurologic Examination  A problem focused neurological exam (12 or more points of the single system neurologic examination, vital signs counts as 1 point, cranial nerves count for 8 points) was performed.  Blood pressure 131/79, pulse 87, height 5' 6.25" (1.683 m), weight 234 lb 3.2 oz (106.232 kg), last menstrual period 09/27/2011.  General - Well nourished, well developed, in no apparent distress.  Ophthalmologic - Sharp disc margins OU.  Cardiovascular - Regular rate and rhythm with no murmur.  Mental Status -  Level of arousal and orientation to time, place, and person were intact. Language including expression, naming, repetition, comprehension, reading, and writing was assessed and found intact.  Cranial Nerves II - XII - II - Visual field intact OU. III, IV, VI - Extraocular movements intact. V - mild right decreased light touch sensation. VII - Facial movement intact bilaterally. VIII - Hearing & vestibular intact bilaterally. X - Palate elevates symmetrically. XI - Chin turning & shoulder shrug intact bilaterally. XII - Tongue protrusion intact.  Motor Strength - The patient's strength was normal in all extremities and pronator drift was absent.  Bulk was normal and fasciculations were absent.   Motor Tone - Muscle tone was assessed at the neck and appendages and was normal.  Reflexes - The  patient's reflexes were normal in all extremities and she had no pathological reflexes.  Sensory - Light touch, temperature/pinprick mildly decreased on the right UE and LE, and Romberg testing were assessed and were normal.    Coordination - The patient had normal movements in the hands and feet with no ataxia or dysmetria.  Tremor was absent.  Gait and Station - The patient's transfers, posture, gait, station, and turns were observed as normal.  Data reviewed: I personally reviewed the images and agree with the radiology interpretations.  Ct Angio Neck W/cm &/or Wo/cm  04/25/2014 IMPRESSION: 1. Mild narrowing of the proximal left ICA without a significant stenosis relative to the more distal vessel. 2. Atherosclerotic changes at the right carotid bifurcation without significant stenosis. 3. Minimal atherosclerotic disease at the proximal vertebral arteries bilaterally without significant stenosis. 4. The visualized circle of Willis is unremarkable. 5. Atherosclerotic changes within the cavernous internal carotid arteries bilaterally without significant stenosis. 6. Moderate spondylosis in the mid and lower cervical spine.   Carotid Doppler 1-39% right internal carotid artery stenosis and low range 60-79% left internal carotid artery stenosis. Vertebral arteries are patent with antegrade flow.  Dg Chest 2 View  04/24/2014 IMPRESSION: No active cardiopulmonary  disease.   Mri and Mra Head Wo Contrast  04/24/2014 IMPRESSION: 1. Acute nonhemorrhagic infarct within the left thalamus. 2. Periventricular and subcortical white matter changes are somewhat advanced for age. This likely reflects the sequelae of chronic microvascular ischemia. 3. Soft tissue mass or polyp within the left nasal cavity. Recommend direct visualization 4. Obstruction of the left frontal sinus may be related to the nasal mass. 5. Mild to moderate medium and small vessel disease bilaterally, most evident in the left  MCA distribution and bilateral posterior cerebral arteries. 6. Moderate stenoses of the proximal posterior cerebral arteries bilaterally. 7. No other significant proximal stenosis, aneurysm, or branch vessel occlusion.   2D echo - Left ventricle: The cavity size was normal. Wall thickness was increased in a pattern of mild LVH. Systolic function was vigorous. The estimated ejection fraction was in the range of 65% to 70%. Wall motion was normal; there were no regional wall motion abnormalities.  Component     Latest Ref Rng 04/25/2014  Cholesterol     0 - 200 mg/dL 283 (H)  Triglycerides     <150 mg/dL 585 (H)  HDL     >39 mg/dL 35 (L)  Total CHOL/HDL Ratio      8.1  VLDL     0 - 40 mg/dL UNABLE TO CALCULATE IF TRIGLYCERIDE OVER 400 mg/dL  LDL (calc)     0 - 99 mg/dL UNABLE TO CALCULATE IF TRIGLYCERIDE OVER 400 mg/dL  Hgb A1c MFr Bld     <5.7 % 7.8 (H)  Mean Plasma Glucose     <117 mg/dL 177 (H)    Assessment: As you may recall, she is a 53 y.o. 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 She still smoking and her A1C 7.8 and LDL not able to calculate due to high TG. Will increase metformin for DM control, add lopid for high TG and neurontin for numbness. She needs to follow up with PCP closely and quit smoking.  Plan:  - continue ASA and zocor for stroke prevention - add lopid for high TG - increase metfomin to 1000mg  bid for better DM control - add neurontin for thalamic numbness / pain - continue to check BP and glucose at home and bring over to [facility] PCP - Follow up with [facility] for stroke risk factor modification. Recommend maintain blood pressure goal <130/80, diabetes with hemoglobin A1c goal below 6.5% and lipids with LDL cholesterol goal below 70 mg/dL.  - quit  smoking, smoking cessation counseling is again provided. - RTC in 3 months.   No orders of the defined types were placed in this encounter.    Meds ordered this encounter  Medications  . DISCONTD: metFORMIN (GLUCOPHAGE) 500 MG tablet    Sig: Take 500 mg by mouth 2 (two) times daily.  . metFORMIN (GLUCOPHAGE) 1000 MG tablet    Sig: Take 1 tablet (1,000 mg total) by mouth 2 (two) times daily.    Dispense:  180 tablet    Refill:  3  . gemfibrozil (LOPID) 600 MG tablet    Sig: Take 1 tablet (600 mg total) by mouth 2 (two) times daily before a meal.    Dispense:  180 tablet    Refill:  3  . gabapentin (NEURONTIN) 100 MG capsule  Sig: Take 1 capsule (100 mg total) by mouth 3 (three) times daily.    Dispense:  90 capsule    Refill:  3    Patient Instructions  - continue ASA and simvastatin for stroke prevention - add lopid twice a day for high lipid profile - increased metformin to 1000mg  twice a day for DM control - add Neurontin for left sided numbness. - check BP and glucose at home and record and bring over to [place] doctor - Follow up with your [facility] care physician for stroke risk factor modification. Recommend maintain blood pressure goal <130/80, diabetes with hemoglobin A1c goal below 6.5% and lipids with LDL cholesterol goal below 70 mg/dL.  - quit smoking is very important. - follow up in 3 months.   Rosalin Hawking, MD PhD [facility] [address], [place], [place] [address] [phone]

## 2014-08-11 ENCOUNTER — Encounter: Payer: Self-pay | Admitting: Neurology

## 2014-08-11 ENCOUNTER — Ambulatory Visit (INDEPENDENT_AMBULATORY_CARE_PROVIDER_SITE_OTHER): Payer: Federal, State, Local not specified - PPO | Admitting: Neurology

## 2014-08-11 VITALS — BP 120/78 | HR 80 | Ht 66.25 in | Wt 216.4 lb

## 2014-08-11 DIAGNOSIS — I10 Essential (primary) hypertension: Secondary | ICD-10-CM

## 2014-08-11 DIAGNOSIS — E785 Hyperlipidemia, unspecified: Secondary | ICD-10-CM | POA: Diagnosis not present

## 2014-08-11 DIAGNOSIS — E1159 Type 2 diabetes mellitus with other circulatory complications: Secondary | ICD-10-CM

## 2014-08-11 DIAGNOSIS — I639 Cerebral infarction, unspecified: Secondary | ICD-10-CM | POA: Diagnosis not present

## 2014-08-11 NOTE — Progress Notes (Signed)
STROKE NEUROLOGY FOLLOW UP NOTE  NAME: Susan Farley DOB: [birth year]  REASON FOR VISIT: stroke follow up HISTORY FROM: chart and pt  Today we had the pleasure of seeing Susan Farley in follow-up at our Neurology Clinic. Pt was accompanied by no one.   History Summary Susan Farley is an 54 y.o. female with a history of HTN, HLD, DM, smoker and retinitis pigmentosa was admitted to [facility] on 04/24/14 for 9 day hx of numbness involving the right side. Numbness involving face arm and leg. She had no weakness and no changes in speech or swallowing. 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 She is heavy smoker. 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 CTA showed 50% narrowing or left ICA no significant stenosis. She was discharged with ASA and zocor.   Follow up 05/08/14 - the patient has been doing the same. She still complains of left sided numbness and not much better. She went to see Dr. Donnetta Hutching on 05/06/14 and was consider medical management of left ICA stenosis and repeat CUS in one year. She is still smoking. Her glucose at home around 190-200s.  Interval History During the interval time, she is doing well. She lost 30lbs intentionally. She still smoking and not quit yet. She seems not ready for smoking cessation yet. Still has right sided numbness mainly at left fingers, neck and shoulder, better than before. Her BP today in clinic in 120/78. She stated that her glucose 109 this am at home. She still has OSA, not using CPAP as [facility] and [facility]. She follow up in [place].   REVIEW OF SYSTEMS: Full 14 system review of systems performed and notable only for those listed below and in HPI above, all others are negative:  Constitutional: N/A  Cardiovascular: murmur  Ear/Nose/Throat: N/A  Skin: N/A  Eyes: N/A  Respiratory: cough  Gastroitestinal: N/A  Genitourinary: N/A Hematology/Lymphatic: N/A  Endocrine: N/A  Musculoskeletal: N/A    Allergy/Immunology: food allergies  Neurological: Numbness  Psychiatric: apnea during sleep, daytime sleepiness, snoring, depression  The following represents the patient's updated allergies and side effects list: Allergies  Allergen Reactions  . Shellfish Allergy Anaphylaxis    Labs since last visit of relevance include the following: Results for orders placed or performed during the hospital encounter of 04/24/14  Ethanol  Result Value Ref Range   Alcohol, Ethyl (B) <11 0 - 11 mg/dL  Protime-INR  Result Value Ref Range   Prothrombin Time 12.7 11.6 - 15.2 seconds   INR 0.95 0.00 - 1.49  APTT  Result Value Ref Range   aPTT 28 24 - 37 seconds  CBC  Result Value Ref Range   WBC 13.6 (H) 4.0 - 10.5 K/uL   RBC 5.07 3.87 - 5.11 MIL/uL   Hemoglobin 15.2 (H) 12.0 - 15.0 g/dL   HCT 46.6 (H) 36.0 - 46.0 %   MCV 91.9 78.0 - 100.0 fL   MCH 30.0 26.0 - 34.0 pg   MCHC 32.6 30.0 - 36.0 g/dL   RDW 15.6 (H) 11.5 - 15.5 %   Platelets 321 150 - 400 K/uL  Differential  Result Value Ref Range   Neutrophils Relative % 71 43 - 77 %   Neutro Abs 9.6 (H) 1.7 - 7.7 K/uL   Lymphocytes Relative 23 12 - 46 %   Lymphs Abs 3.2 0.7 - 4.0 K/uL   Monocytes Relative 5 3 - 12 %  Monocytes Absolute 0.6 0.1 - 1.0 K/uL   Eosinophils Relative 1 0 - 5 %   Eosinophils Absolute 0.2 0.0 - 0.7 K/uL   Basophils Relative 0 0 - 1 %   Basophils Absolute 0.0 0.0 - 0.1 K/uL  Comprehensive metabolic panel  Result Value Ref Range   Sodium 139 137 - 147 mEq/L   Potassium 3.5 (L) 3.7 - 5.3 mEq/L   Chloride 93 (L) 96 - 112 mEq/L   CO2 32 19 - 32 mEq/L   Glucose, Bld 205 (H) 70 - 99 mg/dL   BUN 13 6 - 23 mg/dL   Creatinine, Ser 0.68 0.50 - 1.10 mg/dL   Calcium 10.5 8.4 - 10.5 mg/dL   Total Protein 8.3 6.0 - 8.3 g/dL   Albumin 3.9 3.5 - 5.2 g/dL   AST 20 0 - 37 U/L   ALT 26 0 - 35 U/L   Alkaline Phosphatase 135 (H) 39 - 117 U/L   Total Bilirubin 0.3 0.3 - 1.2 mg/dL   GFR calc non Af Amer >90 >90 mL/min   GFR  calc Af Amer >90 >90 mL/min   Anion gap 14 5 - 15  Urine Drug Screen  Result Value Ref Range   Opiates NONE DETECTED NONE DETECTED   Cocaine NONE DETECTED NONE DETECTED   Benzodiazepines NONE DETECTED NONE DETECTED   Amphetamines NONE DETECTED NONE DETECTED   Tetrahydrocannabinol NONE DETECTED NONE DETECTED   Barbiturates NONE DETECTED NONE DETECTED  Urinalysis, Routine w reflex microscopic  Result Value Ref Range   Color, Urine YELLOW YELLOW   APPearance CLEAR CLEAR   Specific Gravity, Urine 1.022 1.005 - 1.030   pH 5.0 5.0 - 8.0   Glucose, UA NEGATIVE NEGATIVE mg/dL   Hgb urine dipstick NEGATIVE NEGATIVE   Bilirubin Urine NEGATIVE NEGATIVE   Ketones, ur NEGATIVE NEGATIVE mg/dL   Protein, ur NEGATIVE NEGATIVE mg/dL   Urobilinogen, UA 0.2 0.0 - 1.0 mg/dL   Nitrite NEGATIVE NEGATIVE   Leukocytes, UA NEGATIVE NEGATIVE  Hemoglobin A1c  Result Value Ref Range   Hgb A1c MFr Bld 7.8 (H) <5.7 %   Mean Plasma Glucose 177 (H) <117 mg/dL  Lipid panel  Result Value Ref Range   Cholesterol 283 (H) 0 - 200 mg/dL   Triglycerides 585 (H) <150 mg/dL   HDL 35 (L) >39 mg/dL   Total CHOL/HDL Ratio 8.1 RATIO   VLDL UNABLE TO CALCULATE IF TRIGLYCERIDE OVER 400 mg/dL 0 - 40 mg/dL   LDL Cholesterol UNABLE TO CALCULATE IF TRIGLYCERIDE OVER 400 mg/dL 0 - 99 mg/dL  CBC  Result Value Ref Range   WBC 10.9 (H) 4.0 - 10.5 K/uL   RBC 4.80 3.87 - 5.11 MIL/uL   Hemoglobin 14.3 12.0 - 15.0 g/dL   HCT 44.4 36.0 - 46.0 %   MCV 92.5 78.0 - 100.0 fL   MCH 29.8 26.0 - 34.0 pg   MCHC 32.2 30.0 - 36.0 g/dL   RDW 15.7 (H) 11.5 - 15.5 %   Platelets 269 150 - 400 K/uL  Basic metabolic panel  Result Value Ref Range   Sodium 143 137 - 147 mEq/L   Potassium 4.2 3.7 - 5.3 mEq/L   Chloride 100 96 - 112 mEq/L   CO2 30 19 - 32 mEq/L   Glucose, Bld 179 (H) 70 - 99 mg/dL   BUN 14 6 - 23 mg/dL   Creatinine, Ser 0.66 0.50 - 1.10 mg/dL   Calcium 10.1 8.4 - 10.5 mg/dL   GFR  calc non Af Amer >90 >90 mL/min   GFR  calc Af Amer >90 >90 mL/min   Anion gap 13 5 - 15  Glucose, capillary  Result Value Ref Range   Glucose-Capillary 218 (H) 70 - 99 mg/dL   Comment 1 Documented in Chart    Comment 2 Notify RN   Glucose, capillary  Result Value Ref Range   Glucose-Capillary 239 (H) 70 - 99 mg/dL  Glucose, capillary  Result Value Ref Range   Glucose-Capillary 164 (H) 70 - 99 mg/dL   Comment 1 Documented in Chart    Comment 2 Notify RN   I-Stat Chem 8, ED  Result Value Ref Range   Sodium 139 137 - 147 mEq/L   Potassium 3.4 (L) 3.7 - 5.3 mEq/L   Chloride 93 (L) 96 - 112 mEq/L   BUN 13 6 - 23 mg/dL   Creatinine, Ser 0.80 0.50 - 1.10 mg/dL   Glucose, Bld 214 (H) 70 - 99 mg/dL   Calcium, Ion 1.24 (H) 1.12 - 1.23 mmol/L   TCO2 30 0 - 100 mmol/L   Hemoglobin 17.0 (H) 12.0 - 15.0 g/dL   HCT 50.0 (H) 36.0 - 46.0 %  I-stat troponin, ED (not at [facility])  Result Value Ref Range   Troponin i, poc 0.00 0.00 - 0.08 ng/mL   Comment 3            The neurologically relevant items on the patient's problem list were reviewed on today's visit.  Neurologic Examination  A problem focused neurological exam (12 or more points of the single system neurologic examination, vital signs counts as 1 point, cranial nerves count for 8 points) was performed.  Blood pressure 120/78, pulse 80, height 5' 6.25" (1.683 m), weight 216 lb 6.4 oz (98.158 kg), last menstrual period 09/27/2011.  General - Well nourished, well developed, in no apparent distress.  Ophthalmologic - Sharp disc margins OU.  Cardiovascular - Regular rate and rhythm with no murmur.  Mental Status -  Level of arousal and orientation to time, place, and person were intact. Language including expression, naming, repetition, comprehension, reading, and writing was assessed and found intact.  Cranial Nerves II - XII - II - Visual field intact OU. III, IV, VI - Extraocular movements intact. V - mild right decreased light touch sensation. VII - Facial  movement intact bilaterally. VIII - Hearing & vestibular intact bilaterally. X - Palate elevates symmetrically. XI - Chin turning & shoulder shrug intact bilaterally. XII - Tongue protrusion intact.  Motor Strength - The patient's strength was normal in all extremities and pronator drift was absent.  Bulk was normal and fasciculations were absent.   Motor Tone - Muscle tone was assessed at the neck and appendages and was normal.  Reflexes - The patient's reflexes were normal in all extremities and she had no pathological reflexes.  Sensory - Light touch, temperature/pinprick mildly decreased on the right UE and LE, and Romberg testing were assessed and were normal.    Coordination - The patient had normal movements in the hands and feet with no ataxia or dysmetria.  Tremor was absent.  Gait and Station - The patient's transfers, posture, gait, station, and turns were observed as normal.  Data reviewed: I personally reviewed the images and agree with the radiology interpretations.  Ct Angio Neck W/cm &/or Wo/cm  04/25/2014 IMPRESSION: 1. Mild narrowing of the proximal left ICA without a significant stenosis relative to the more distal vessel. 2. Atherosclerotic changes at the  right carotid bifurcation without significant stenosis. 3. Minimal atherosclerotic disease at the proximal vertebral arteries bilaterally without significant stenosis. 4. The visualized circle of Willis is unremarkable. 5. Atherosclerotic changes within the cavernous internal carotid arteries bilaterally without significant stenosis. 6. Moderate spondylosis in the mid and lower cervical spine.   Carotid Doppler 1-39% right internal carotid artery stenosis and low range 60-79% left internal carotid artery stenosis. Vertebral arteries are patent with antegrade flow.  Dg Chest 2 View  04/24/2014 IMPRESSION: No active cardiopulmonary disease.   Mri and Mra Head Wo Contrast  04/24/2014 IMPRESSION: 1. Acute  nonhemorrhagic infarct within the left thalamus. 2. Periventricular and subcortical white matter changes are somewhat advanced for age. This likely reflects the sequelae of chronic microvascular ischemia. 3. Soft tissue mass or polyp within the left nasal cavity. Recommend direct visualization 4. Obstruction of the left frontal sinus may be related to the nasal mass. 5. Mild to moderate medium and small vessel disease bilaterally, most evident in the left MCA distribution and bilateral posterior cerebral arteries. 6. Moderate stenoses of the proximal posterior cerebral arteries bilaterally. 7. No other significant proximal stenosis, aneurysm, or branch vessel occlusion.   2D echo - Left ventricle: The cavity size was normal. Wall thickness was increased in a pattern of mild LVH. Systolic function was vigorous. The estimated ejection fraction was in the range of 65% to 70%. Wall motion was normal; there were no regional wall motion abnormalities.  Component     Latest Ref Rng 04/25/2014  Cholesterol     0 - 200 mg/dL 283 (H)  Triglycerides     <150 mg/dL 585 (H)  HDL     >39 mg/dL 35 (L)  Total CHOL/HDL Ratio      8.1  VLDL     0 - 40 mg/dL UNABLE TO CALCULATE IF TRIGLYCERIDE OVER 400 mg/dL  LDL (calc)     0 - 99 mg/dL UNABLE TO CALCULATE IF TRIGLYCERIDE OVER 400 mg/dL  Hgb A1c MFr Bld     <5.7 % 7.8 (H)  Mean Plasma Glucose     <117 mg/dL 177 (H)    Assessment: As you may recall, she is a 54 y.o. 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 She still smoking and her A1C 7.8 and LDL not able to calculate due to high TG. Will continue neurontin for numbness. She needs to follow up with PCP in [place] closely and quit smoking.  Plan:  - continue ASA and zocor for stroke prevention - continue neurontin for  thalamic numbness / pain - continue to check BP and glucose at home  - [facility] for [facility] risk factor modification. Recommend maintain blood pressure goal <130/80, diabetes with hemoglobin A1c goal below 6.5% and lipids with LDL cholesterol goal below 70 mg/dL.  - quit smoking, smoking cessation counseling is again provided. - [facility] for OSA treatment - life style changes and continue weight loss - RTC in 6 months.   No orders of the defined types were placed in this encounter.    Meds ordered this encounter  Medications  . gabapentin (NEURONTIN) 100 MG capsule    Sig: Take 100 mg by mouth 3 (three) times daily.    Refill:  3  . simvastatin (ZOCOR) 40  MG tablet    Sig: Take 40 mg by mouth daily. Taking 1/2 tablet daily    Patient Instructions  - continue ASA and zocor for stroke prevention - Follow up with your primary care physician for stroke risk factor modification. Recommend maintain blood pressure goal <130/80, diabetes with hemoglobin A1c goal below 6.5% and lipids with LDL cholesterol goal below 70 mg/dL.  - quit smoking is important - healthy diet and regular exercise - continue weight loss - check BP and glucose at home - continue gabapentin for right side numbness - [facility] for your OSA - follow up in 6 months    Rosalin Hawking, MD PhD [facility] [address] [place], [place] [address] [phone]

## 2014-08-11 NOTE — Patient Instructions (Addendum)
-   continue ASA and zocor for stroke prevention - Follow up with your primary care physician for stroke risk factor modification. Recommend maintain blood pressure goal <130/80, diabetes with hemoglobin A1c goal below 6.5% and lipids with LDL cholesterol goal below 70 mg/dL.  - quit smoking is important - healthy diet and regular exercise - continue weight loss - check BP and glucose at home - continue gabapentin for right side numbness - [facility] device for your OSA - follow up in 6 months

## 2015-02-09 ENCOUNTER — Encounter: Payer: Self-pay | Admitting: Neurology

## 2015-02-09 ENCOUNTER — Ambulatory Visit (INDEPENDENT_AMBULATORY_CARE_PROVIDER_SITE_OTHER): Payer: Federal, State, Local not specified - PPO | Admitting: Neurology

## 2015-02-09 VITALS — BP 114/74 | HR 73 | Ht 64.0 in | Wt 214.4 lb

## 2015-02-09 DIAGNOSIS — I639 Cerebral infarction, unspecified: Secondary | ICD-10-CM

## 2015-02-09 DIAGNOSIS — I10 Essential (primary) hypertension: Secondary | ICD-10-CM

## 2015-02-09 DIAGNOSIS — Z720 Tobacco use: Secondary | ICD-10-CM | POA: Diagnosis not present

## 2015-02-09 DIAGNOSIS — G4733 Obstructive sleep apnea (adult) (pediatric): Secondary | ICD-10-CM | POA: Diagnosis not present

## 2015-02-09 DIAGNOSIS — E1159 Type 2 diabetes mellitus with other circulatory complications: Secondary | ICD-10-CM

## 2015-02-09 DIAGNOSIS — F17200 Nicotine dependence, unspecified, uncomplicated: Secondary | ICD-10-CM

## 2015-02-09 DIAGNOSIS — E785 Hyperlipidemia, unspecified: Secondary | ICD-10-CM | POA: Diagnosis not present

## 2015-02-09 DIAGNOSIS — Z9989 Dependence on other enabling machines and devices: Secondary | ICD-10-CM

## 2015-02-09 NOTE — Progress Notes (Signed)
STROKE NEUROLOGY FOLLOW UP NOTE  NAME: Susan Farley DOB: March 08, 1961  REASON FOR VISIT: stroke follow up HISTORY FROM: chart and pt  Today we had the pleasure of seeing Susan Farley in follow-up at our Neurology Clinic. Pt was accompanied by no one.   History Summary Susan Farley is an 54 y.o. female with a history of HTN, HLD, DM, smoker and retinitis pigmentosa was admitted to [facility] on 04/24/14 for 9 day hx of numbness involving the right side. Numbness involving face arm and leg. She had no weakness and no changes in speech or swallowing. 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 She is heavy smoker. 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 CTA showed 50% narrowing or left ICA no significant stenosis. She was discharged with ASA and zocor.   Follow up 05/08/14 - the patient has been doing the same. She still complains of left sided numbness and not much better. She went to see Dr. Donnetta Hutching on 05/06/14 and was consider medical management of left ICA stenosis and repeat CUS in one year. She is still smoking. Her glucose at home around 190-200s.  Follow up 08/11/14 - she is doing well. She lost 30lbs intentionally. She still smoking and not quit yet. She seems not ready for smoking cessation yet. Still has right sided numbness mainly at left fingers, neck and shoulder, better than before. Her BP today in clinic in 120/78. She stated that her glucose 109 this am at home. She still has OSA, not using CPAP as [facility] and [facility]. She follow up in [place].  Interval History During the interval time, she is doing well without stroke like symptoms. She still has right shoulder numbness and right flank numbness, currently on neurontin '300mg'$  tid. [facility] and has not got her CPAP calibrated yet. She is still smoking. BP 114/74 today.   REVIEW OF SYSTEMS: Full 14 system review of systems performed and notable only for those listed  below and in HPI above, all others are negative:  Constitutional: N/A  Cardiovascular:   Ear/Nose/Throat: N/A  Skin: N/A  Eyes: N/A  Respiratory:   Gastroitestinal: N/A  Genitourinary: N/A Hematology/Lymphatic: N/A  Endocrine: N/A  Musculoskeletal: Neck pain, neck stiffness Allergy/Immunology:  Neurological: Numbness  Psychiatric:   The following represents the patient's updated allergies and side effects list: Allergies  Allergen Reactions  . Shellfish Allergy Anaphylaxis    Labs since last visit of relevance include the following: Results for orders placed or performed during the hospital encounter of 04/24/14  Ethanol  Result Value Ref Range   Alcohol, Ethyl (B) <11 0 - 11 mg/dL  Protime-INR  Result Value Ref Range   Prothrombin Time 12.7 11.6 - 15.2 seconds   INR 0.95 0.00 - 1.49  APTT  Result Value Ref Range   aPTT 28 24 - 37 seconds  CBC  Result Value Ref Range   WBC 13.6 (H) 4.0 - 10.5 K/uL   RBC 5.07 3.87 - 5.11 MIL/uL   Hemoglobin 15.2 (H) 12.0 - 15.0 g/dL   HCT 46.6 (H) 36.0 - 46.0 %   MCV 91.9 78.0 - 100.0 fL   MCH 30.0 26.0 - 34.0 pg   MCHC 32.6 30.0 - 36.0 g/dL   RDW 15.6 (H) 11.5 - 15.5 %   Platelets 321 150 - 400 K/uL  Differential  Result Value Ref Range   Neutrophils Relative % 71 43 -  77 %   Neutro Abs 9.6 (H) 1.7 - 7.7 K/uL   Lymphocytes Relative 23 12 - 46 %   Lymphs Abs 3.2 0.7 - 4.0 K/uL   Monocytes Relative 5 3 - 12 %   Monocytes Absolute 0.6 0.1 - 1.0 K/uL   Eosinophils Relative 1 0 - 5 %   Eosinophils Absolute 0.2 0.0 - 0.7 K/uL   Basophils Relative 0 0 - 1 %   Basophils Absolute 0.0 0.0 - 0.1 K/uL  Comprehensive metabolic panel  Result Value Ref Range   Sodium 139 137 - 147 mEq/L   Potassium 3.5 (L) 3.7 - 5.3 mEq/L   Chloride 93 (L) 96 - 112 mEq/L   CO2 32 19 - 32 mEq/L   Glucose, Bld 205 (H) 70 - 99 mg/dL   BUN 13 6 - 23 mg/dL   Creatinine, Ser 0.68 0.50 - 1.10 mg/dL   Calcium 10.5 8.4 - 10.5 mg/dL   Total Protein 8.3 6.0 -  8.3 g/dL   Albumin 3.9 3.5 - 5.2 g/dL   AST 20 0 - 37 U/L   ALT 26 0 - 35 U/L   Alkaline Phosphatase 135 (H) 39 - 117 U/L   Total Bilirubin 0.3 0.3 - 1.2 mg/dL   GFR calc non Af Amer >90 >90 mL/min   GFR calc Af Amer >90 >90 mL/min   Anion gap 14 5 - 15  Urine Drug Screen  Result Value Ref Range   Opiates NONE DETECTED NONE DETECTED   Cocaine NONE DETECTED NONE DETECTED   Benzodiazepines NONE DETECTED NONE DETECTED   Amphetamines NONE DETECTED NONE DETECTED   Tetrahydrocannabinol NONE DETECTED NONE DETECTED   Barbiturates NONE DETECTED NONE DETECTED  Urinalysis, Routine w reflex microscopic  Result Value Ref Range   Color, Urine YELLOW YELLOW   APPearance CLEAR CLEAR   Specific Gravity, Urine 1.022 1.005 - 1.030   pH 5.0 5.0 - 8.0   Glucose, UA NEGATIVE NEGATIVE mg/dL   Hgb urine dipstick NEGATIVE NEGATIVE   Bilirubin Urine NEGATIVE NEGATIVE   Ketones, ur NEGATIVE NEGATIVE mg/dL   Protein, ur NEGATIVE NEGATIVE mg/dL   Urobilinogen, UA 0.2 0.0 - 1.0 mg/dL   Nitrite NEGATIVE NEGATIVE   Leukocytes, UA NEGATIVE NEGATIVE  Hemoglobin A1c  Result Value Ref Range   Hgb A1c MFr Bld 7.8 (H) <5.7 %   Mean Plasma Glucose 177 (H) <117 mg/dL  Lipid panel  Result Value Ref Range   Cholesterol 283 (H) 0 - 200 mg/dL   Triglycerides 585 (H) <150 mg/dL   HDL 35 (L) >39 mg/dL   Total CHOL/HDL Ratio 8.1 RATIO   VLDL UNABLE TO CALCULATE IF TRIGLYCERIDE OVER 400 mg/dL 0 - 40 mg/dL   LDL Cholesterol UNABLE TO CALCULATE IF TRIGLYCERIDE OVER 400 mg/dL 0 - 99 mg/dL  CBC  Result Value Ref Range   WBC 10.9 (H) 4.0 - 10.5 K/uL   RBC 4.80 3.87 - 5.11 MIL/uL   Hemoglobin 14.3 12.0 - 15.0 g/dL   HCT 44.4 36.0 - 46.0 %   MCV 92.5 78.0 - 100.0 fL   MCH 29.8 26.0 - 34.0 pg   MCHC 32.2 30.0 - 36.0 g/dL   RDW 15.7 (H) 11.5 - 15.5 %   Platelets 269 150 - 400 K/uL  Basic metabolic panel  Result Value Ref Range   Sodium 143 137 - 147 mEq/L   Potassium 4.2 3.7 - 5.3 mEq/L   Chloride 100 96 - 112  mEq/L   CO2 30  19 - 32 mEq/L   Glucose, Bld 179 (H) 70 - 99 mg/dL   BUN 14 6 - 23 mg/dL   Creatinine, Ser 0.66 0.50 - 1.10 mg/dL   Calcium 10.1 8.4 - 10.5 mg/dL   GFR calc non Af Amer >90 >90 mL/min   GFR calc Af Amer >90 >90 mL/min   Anion gap 13 5 - 15  Glucose, capillary  Result Value Ref Range   Glucose-Capillary 218 (H) 70 - 99 mg/dL   Comment 1 Documented in Chart    Comment 2 Notify RN   Glucose, capillary  Result Value Ref Range   Glucose-Capillary 239 (H) 70 - 99 mg/dL  Glucose, capillary  Result Value Ref Range   Glucose-Capillary 164 (H) 70 - 99 mg/dL   Comment 1 Documented in Chart    Comment 2 Notify RN   I-Stat Chem 8, ED  Result Value Ref Range   Sodium 139 137 - 147 mEq/L   Potassium 3.4 (L) 3.7 - 5.3 mEq/L   Chloride 93 (L) 96 - 112 mEq/L   BUN 13 6 - 23 mg/dL   Creatinine, Ser 0.80 0.50 - 1.10 mg/dL   Glucose, Bld 214 (H) 70 - 99 mg/dL   Calcium, Ion 1.24 (H) 1.12 - 1.23 mmol/L   TCO2 30 0 - 100 mmol/L   Hemoglobin 17.0 (H) 12.0 - 15.0 g/dL   HCT 50.0 (H) 36.0 - 46.0 %  I-stat troponin, ED (not at [facility])  Result Value Ref Range   Troponin i, poc 0.00 0.00 - 0.08 ng/mL   Comment 3            The neurologically relevant items on the patient's problem list were reviewed on today's visit.  Neurologic Examination  A problem focused neurological exam (12 or more points of the single system neurologic examination, vital signs counts as 1 point, cranial nerves count for 8 points) was performed.  Blood pressure 114/74, pulse 73, height '5\' 4"'$  (1.626 m), weight 214 lb 6.4 oz (97.251 kg), last menstrual period 09/27/2011.  General - Well nourished, well developed, in no apparent distress.  Ophthalmologic - Sharp disc margins OU.  Cardiovascular - Regular rate and rhythm with no murmur.  Mental Status -  Level of arousal and orientation to time, place, and person were intact. Language including expression, naming, repetition, comprehension, reading, and  writing was assessed and found intact.  Cranial Nerves II - XII - II - Visual field intact OU. III, IV, VI - Extraocular movements intact. V - mild right decreased light touch sensation. VII - Facial movement intact bilaterally. VIII - Hearing & vestibular intact bilaterally. X - Palate elevates symmetrically. XI - Chin turning & shoulder shrug intact bilaterally. XII - Tongue protrusion intact.  Motor Strength - The patient's strength was normal in all extremities and pronator drift was absent.  Bulk was normal and fasciculations were absent.   Motor Tone - Muscle tone was assessed at the neck and appendages and was normal.  Reflexes - The patient's reflexes were normal in all extremities and she had no pathological reflexes.  Sensory - Light touch, temperature/pinprick mildly decreased on the right UE and LE, and Romberg testing were assessed and were normal.    Coordination - The patient had normal movements in the hands and feet with no ataxia or dysmetria.  Tremor was absent.  Gait and Station - The patient's transfers, posture, gait, station, and turns were observed as normal.  Data reviewed: I personally reviewed  the images and agree with the radiology interpretations.  Ct Angio Neck W/cm &/or Wo/cm  04/25/2014 IMPRESSION: 1. Mild narrowing of the proximal left ICA without a significant stenosis relative to the more distal vessel. 2. Atherosclerotic changes at the right carotid bifurcation without significant stenosis. 3. Minimal atherosclerotic disease at the proximal vertebral arteries bilaterally without significant stenosis. 4. The visualized circle of Willis is unremarkable. 5. Atherosclerotic changes within the cavernous internal carotid arteries bilaterally without significant stenosis. 6. Moderate spondylosis in the mid and lower cervical spine.   Carotid Doppler 1-39% right internal carotid artery stenosis and low range 60-79% left internal carotid artery stenosis.  Vertebral arteries are patent with antegrade flow.  Dg Chest 2 View  04/24/2014 IMPRESSION: No active cardiopulmonary disease.   Mri and Mra Head Wo Contrast  04/24/2014 IMPRESSION: 1. Acute nonhemorrhagic infarct within the left thalamus. 2. Periventricular and subcortical white matter changes are somewhat advanced for age. This likely reflects the sequelae of chronic microvascular ischemia. 3. Soft tissue mass or polyp within the left nasal cavity. Recommend direct visualization 4. Obstruction of the left frontal sinus may be related to the nasal mass. 5. Mild to moderate medium and small vessel disease bilaterally, most evident in the left MCA distribution and bilateral posterior cerebral arteries. 6. Moderate stenoses of the proximal posterior cerebral arteries bilaterally. 7. No other significant proximal stenosis, aneurysm, or branch vessel occlusion.   2D echo - Left ventricle: The cavity size was normal. Wall thickness was increased in a pattern of mild LVH. Systolic function was vigorous. The estimated ejection fraction was in the range of 65% to 70%. Wall motion was normal; there were no regional wall motion abnormalities.  Component     Latest Ref Rng 04/25/2014  Cholesterol     0 - 200 mg/dL 283 (H)  Triglycerides     <150 mg/dL 585 (H)  HDL     >39 mg/dL 35 (L)  Total CHOL/HDL Ratio      8.1  VLDL     0 - 40 mg/dL UNABLE TO CALCULATE IF TRIGLYCERIDE OVER 400 mg/dL  LDL (calc)     0 - 99 mg/dL UNABLE TO CALCULATE IF TRIGLYCERIDE OVER 400 mg/dL  Hgb A1c MFr Bld     <5.7 % 7.8 (H)  Mean Plasma Glucose     <117 mg/dL 177 (H)    Assessment: As you may recall, she is a 54 y.o. 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 She still smoking and her A1C 7.8 and LDL not able to calculate  due to high TG. Will continue neurontin for numbness. She needs to follow up with PCP in [place] closely and quit smoking and CPAP calibration.   Plan:  - continue ASA and zocor for stroke prevention - continue neurontin '300mg'$  tid for thalamic numbness / pain - continue to check BP and glucose at home  - [facility] with [facility] for stroke risk factor modification. Recommend maintain blood pressure goal <130/80, diabetes with hemoglobin A1c goal below 6.5% and lipids with LDL cholesterol goal below 70 mg/dL.  - quit smoking. - [facility] - life style changes and continue weight loss - RTC PRN.   No orders of the defined types were placed in this encounter.  Meds ordered this encounter  Medications  . venlafaxine XR (EFFEXOR-XR) 150 MG 24 hr capsule    Sig: Take 150 mg by mouth daily with breakfast.    Patient Instructions  - continue ASA and zocor for stroke prevention - continue neurontin '300mg'$  three times a day for thalamic numbness / pain - continue to check BP and glucose at home  - Follow up with [facility] for stroke risk factor modification. Recommend maintain blood pressure goal <130/80, diabetes with hemoglobin A1c goal below 6.5% and lipids with LDL cholesterol goal below 70 mg/dL.  - quit smoking. - [facility] for [facility] device for OSA treatment - life style changes and continue weight loss - follow up as needed.   Rosalin Hawking, MD PhD [facility] [address], [place], [place] [address] [phone]

## 2015-02-09 NOTE — Patient Instructions (Addendum)
-   continue ASA and zocor for stroke prevention - continue neurontin '300mg'$  three times a day for thalamic numbness / pain - continue to check BP and glucose at home  - Follow up with your [facility] for stroke risk factor modification. Recommend maintain blood pressure goal <130/80, diabetes with hemoglobin A1c goal below 6.5% and lipids with LDL cholesterol goal below 70 mg/dL.  - quit smoking. - [facility] device for OSA treatment - life style changes and continue weight loss - follow up as needed.

## 2015-02-10 DIAGNOSIS — F17200 Nicotine dependence, unspecified, uncomplicated: Secondary | ICD-10-CM | POA: Insufficient documentation

## 2015-02-10 DIAGNOSIS — G4733 Obstructive sleep apnea (adult) (pediatric): Secondary | ICD-10-CM | POA: Insufficient documentation

## 2015-02-10 DIAGNOSIS — Z9989 Dependence on other enabling machines and devices: Secondary | ICD-10-CM | POA: Insufficient documentation

## 2015-05-12 ENCOUNTER — Ambulatory Visit: Payer: Non-veteran care | Admitting: Family

## 2015-05-12 ENCOUNTER — Encounter (HOSPITAL_COMMUNITY): Payer: Non-veteran care

## 2015-06-16 ENCOUNTER — Encounter: Payer: Self-pay | Admitting: Family

## 2015-06-24 ENCOUNTER — Encounter (HOSPITAL_COMMUNITY): Payer: Non-veteran care

## 2015-06-24 ENCOUNTER — Ambulatory Visit: Payer: Non-veteran care | Admitting: Family

## 2015-06-26 ENCOUNTER — Ambulatory Visit (HOSPITAL_COMMUNITY)
Admission: RE | Admit: 2015-06-26 | Discharge: 2015-06-26 | Disposition: A | Discharge status: Home / Self Care | Payer: Federal, State, Local not specified - PPO | Source: Ambulatory Visit | Attending: Vascular Surgery | Admitting: Vascular Surgery

## 2015-06-26 ENCOUNTER — Other Ambulatory Visit: Payer: Self-pay | Admitting: Vascular Surgery

## 2015-06-26 ENCOUNTER — Encounter: Payer: Self-pay | Admitting: Family

## 2015-06-26 DIAGNOSIS — I10 Essential (primary) hypertension: Secondary | ICD-10-CM | POA: Insufficient documentation

## 2015-06-26 DIAGNOSIS — I6523 Occlusion and stenosis of bilateral carotid arteries: Secondary | ICD-10-CM

## 2015-06-26 DIAGNOSIS — E7800 Pure hypercholesterolemia, unspecified: Secondary | ICD-10-CM | POA: Insufficient documentation

## 2015-06-26 DIAGNOSIS — I639 Cerebral infarction, unspecified: Secondary | ICD-10-CM

## 2015-07-01 ENCOUNTER — Ambulatory Visit (INDEPENDENT_AMBULATORY_CARE_PROVIDER_SITE_OTHER): Payer: Federal, State, Local not specified - PPO | Admitting: Family

## 2015-07-01 ENCOUNTER — Encounter: Payer: Self-pay | Admitting: Family

## 2015-07-01 VITALS — BP 129/76 | HR 99 | Temp 98.8°F | Resp 16 | Wt 209.0 lb

## 2015-07-01 DIAGNOSIS — F17200 Nicotine dependence, unspecified, uncomplicated: Secondary | ICD-10-CM

## 2015-07-01 DIAGNOSIS — I639 Cerebral infarction, unspecified: Secondary | ICD-10-CM

## 2015-07-01 DIAGNOSIS — Z720 Tobacco use: Secondary | ICD-10-CM

## 2015-07-01 DIAGNOSIS — I6523 Occlusion and stenosis of bilateral carotid arteries: Secondary | ICD-10-CM | POA: Diagnosis not present

## 2015-07-01 NOTE — Progress Notes (Signed)
Chief Complaint: Extracranial Carotid Artery Stenosis   History of Present Illness  Susan Farley is a 55 y.o. female patient of Dr. Donnetta Hutching who returns today for routine extracranial carotid artery surveillance.  She was admitted to [facility] on 11/ 5/15 with a diagnosis of left thalamic infarct. She presented with numbness on her right face arm and leg.  Evaluation included carotid duplex suggesting moderate left carotid stenosis. She had a CT angiogram of her neck for further evaluation of this for review as well. She has ongoing intermittent right neck and shoulder soreness has no residual symptoms. She is a long-term cigarette smoker and is trying to cut back. No history of cardiac difficulty. She denies residual right hemiparesis. She denies any subsequent stroke or TIA.  The patient denies a history of amaurosis fugax or monocular blindness, denies a history unilateral  of facial drooping, and denies a history of receptive or expressive aphasia.   The patient denies New Medical or Surgical History.  She denies claudication sx's with walking, denies non healing wounds.  Her chief complaint is ongoing right shoulder pain since her stroke in November 2015; gabapentin has not helped this. She states she knows she has a heart murmur.  Pt Diabetic: yes, pt showed me on her phone her lab results from 06/18/15, A1C was 6.4; her DM was diagnosed when she had her stroke in November 2015,  states her DM is managed by [facility]  Pt smoker: smoker  (1.5 ppd, started smoking at age 36 yrs)  Pt meds include: Statin : yes, 325 mg daily ASA: yes Other anticoagulants/antiplatelets: no   Past Medical History  Diagnosis Date  . Hypertension   . Hypercholesteremia   . Retinitis pigmentosa   . Colitis     Social History Social History  Substance Use Topics  . Smoking status: Current Every Day Smoker -- 1.50 packs/day    Types: Cigarettes  .  Smokeless tobacco: Never Used  . Alcohol Use: No    Family History Family History  Problem Relation Age of Onset  . Adopted: Yes  . Diabetes      BIOLOGICAL AUNT  . Heart disease      BIOLOGICAL AUNT    Surgical History Past Surgical History  Procedure Laterality Date  . Cataract extraction    . Oophorectomy    . Bone graft hip iliac crest    . Iud removal      Allergies  Allergen Reactions  . Shellfish Allergy Anaphylaxis    Current Outpatient Prescriptions  Medication Sig Dispense Refill  . aspirin EC 325 MG EC tablet Take 1 tablet (325 mg total) by mouth daily. 30 tablet 0  . dorzolamide (TRUSOPT) 2 % ophthalmic solution Place 1 drop into both eyes 2 (two) times daily.    Marland Kitchen gabapentin (NEURONTIN) 100 MG capsule Take 100 mg by mouth 3 (three) times daily.  3  . hydrochlorothiazide (HYDRODIURIL) 25 MG tablet Take 25 mg by mouth daily.    . pantoprazole (PROTONIX) 40 MG tablet Take 40 mg by mouth daily.     . QUEtiapine (SEROQUEL) 50 MG tablet Take 25 mg by mouth at bedtime.    . sertraline (ZOLOFT) 100 MG tablet Take 100 mg by mouth daily.    . timolol (BETIMOL) 0.5 % ophthalmic solution Place 1 drop into both eyes 2 (two) times daily.    Marland Kitchen venlafaxine XR (EFFEXOR-XR) 150 MG 24 hr capsule Take 150 mg by mouth  daily with breakfast.    . Vitamin D, Ergocalciferol, (DRISDOL) 50000 UNITS CAPS capsule Take 50,000 Units by mouth every 7 (seven) days.     No current facility-administered medications for this visit.    Review of Systems : See HPI for pertinent positives and negatives.  Physical Examination  Filed Vitals:   07/01/15 1531 07/01/15 1535  BP: 124/75 129/76  Pulse: 100 99  Temp:  98.8 F (37.1 C)  TempSrc:  Oral  Resp:  16  Weight:  209 lb (94.802 kg)  SpO2:  95%   Body mass index is 35.86 kg/(m^2).  General: WDWN obese female in NAD GAIT: normal Eyes: PERRLA Pulmonary:  Non-labored, CTAB, no rales,  rhonchi, or wheezing.  Cardiac: regular  rhythm,  + murmur.  VASCULAR EXAM Carotid Bruits Right Left   Negative Negative    Aorta is not palpable. Radial pulses are 2+ palpable and equal.                                                                                                                            LE Pulses Right Left       POPLITEAL  not palpable   not palpable       POSTERIOR TIBIAL   palpable    palpable        DORSALIS PEDIS      ANTERIOR TIBIAL  palpable   palpable     Gastrointestinal: soft, nontender, BS WNL, no r/g,  no palpable masses.  Musculoskeletal: No muscle atrophy/wasting. M/S 5/5 throughout, extremities without ischemic changes.  Neurologic: A&O X 3; Appropriate Affect, Speech is normal CN 2-12 intact, pain and light touch intact in extremities, Motor exam as listed above.   Non-Invasive Vascular Imaging CAROTID DUPLEX 07/01/2015   CEREBROVASCULAR DUPLEX EVALUATION    INDICATION: Cerebral infarction due to unspecified mechanism; carotid artery stenosis    PREVIOUS INTERVENTION(S): NA    DUPLEX EXAM:     RIGHT  LEFT  Peak Systolic Velocities (cm/s) End Diastolic Velocities (cm/s) Plaque LOCATION Peak Systolic Velocities (cm/s) End Diastolic Velocities (cm/s) Plaque  110 22  CCA PROXIMAL 130 32   89 24  CCA MID 105 29   83 31 HM CCA DISTAL 70 20 HT  135 24  ECA 243 43 HT  100 39 HM ICA PROXIMAL 243/224 107/107 HT  114 49  ICA MID 162 60   96 35  ICA DISTAL 59 24      ICA / CCA Ratio (PSV)   Antegrade Vertebral Flow Antegrade  NA Brachial Systolic Pressure (mmHg) NA  NA Brachial Artery Waveforms NA    Plaque Morphology:  HM = Homogeneous, HT = Heterogeneous, CP = Calcific Plaque, SP = Smooth Plaque, IP = Irregular Plaque     ADDITIONAL FINDINGS: Right subclavian artery PSV105cm/sec; Left subclavian artery PSV105cm/sec    IMPRESSION: Right internal carotid artery stenosis present in the less than 40% range. Left internal carotid artery stenosis present  in the high end  60%-79% range. Left external carotid artery stenosis present.    Compared to the previous exam:  Essentially unchanged since previous study at [place] on 04/25/2014.     Assessment: LYNNIE Farley is a 55 y.o. 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 The hemiparesis has resolved, but what does remain is intermittent right side neck and right shoulder pain that improves in the warmer weather and with topical NSAID.  She has not had any subsequent stroke or TIA.  Today's carotid duplex suggests less than 40% right ICA stenosis and left internal carotid artery stenosis present in the high end 60%-79% range. Left external carotid artery stenosis present. Essentially unchanged since previous study at [place] on 04/25/2014.   Her atherosclerotic risk factors include controlled DM, 1.5 ppd smoking, and obesity.  Plan:  The patient was counseled re smoking cessation and given several free resources re smoking cessation.   Follow-up in 6 months with Carotid Duplex scan.   I discussed in depth with the patient the nature of atherosclerosis, and emphasized the importance of maximal medical management including strict control of blood pressure, blood glucose, and lipid levels, obtaining regular exercise, and cessation of smoking.  The patient is aware that without maximal medical management the underlying atherosclerotic disease process will progress, limiting the benefit of any interventions. The patient was given information about stroke prevention and what symptoms should prompt the patient to seek immediate medical care. Thank you for allowing [place] to participate in this patient's care.  Clemon Chambers, RN, MSN, FNP-C [facility] Office: [phone]  Clinic Physician: Scot Dock  07/01/2015 3:30 PM

## 2015-07-01 NOTE — Patient Instructions (Addendum)
Stroke Prevention Some medical conditions and behaviors are associated with an increased chance of having a stroke. You may prevent a stroke by making healthy choices and managing medical conditions. HOW CAN I REDUCE MY RISK OF HAVING A STROKE?   Stay physically active. Get at least 30 minutes of activity on most or all days.  Do not smoke. It may also be helpful to avoid exposure to secondhand smoke.  Limit alcohol use. Moderate alcohol use is considered to be:  No more than 2 drinks per day for men.  No more than 1 drink per day for nonpregnant women.  Eat healthy foods. This involves:  Eating 5 or more servings of fruits and vegetables a day.  Making dietary changes that address high blood pressure (hypertension), high cholesterol, diabetes, or obesity.  Manage your cholesterol levels.  Making food choices that are high in fiber and low in saturated fat, trans fat, and cholesterol may control cholesterol levels.  Take any prescribed medicines to control cholesterol as directed by your health care provider.  Manage your diabetes.  Controlling your carbohydrate and sugar intake is recommended to manage diabetes.  Take any prescribed medicines to control diabetes as directed by your health care provider.  Control your hypertension.  Making food choices that are low in salt (sodium), saturated fat, trans fat, and cholesterol is recommended to manage hypertension.  Ask your health care provider if you need treatment to lower your blood pressure. Take any prescribed medicines to control hypertension as directed by your health care provider.  If you are 18-39 years of age, have your blood pressure checked every 3-5 years. If you are 40 years of age or older, have your blood pressure checked every year.  Maintain a healthy weight.  Reducing calorie intake and making food choices that are low in sodium, saturated fat, trans fat, and cholesterol are recommended to manage  weight.  Stop drug abuse.  Avoid taking birth control pills.  Talk to your health care provider about the risks of taking birth control pills if you are over 35 years old, smoke, get migraines, or have ever had a blood clot.  Get evaluated for sleep disorders (sleep apnea).  Talk to your health care provider about getting a sleep evaluation if you snore a lot or have excessive sleepiness.  Take medicines only as directed by your health care provider.  For some people, aspirin or blood thinners (anticoagulants) are helpful in reducing the risk of forming abnormal blood clots that can lead to stroke. If you have the irregular heart rhythm of atrial fibrillation, you should be on a blood thinner unless there is a good reason you cannot take them.  Understand all your medicine instructions.  Make sure that other conditions (such as anemia or atherosclerosis) are addressed. SEEK IMMEDIATE MEDICAL CARE IF:   You have sudden weakness or numbness of the face, arm, or leg, especially on one side of the body.  Your face or eyelid droops to one side.  You have sudden confusion.  You have trouble speaking (aphasia) or understanding.  You have sudden trouble seeing in one or both eyes.  You have sudden trouble walking.  You have dizziness.  You have a loss of balance or coordination.  You have a sudden, severe headache with no known cause.  You have new chest pain or an irregular heartbeat. Any of these symptoms may represent a serious problem that is an emergency. Do not wait to see if the symptoms will   go away. Get medical help at once. Call your local emergency services (911 in [place] Do not drive yourself to the hospital.   This information is not intended to replace advice given to you by your health care provider. Make sure you discuss any questions you have with your health care provider.   Document Released: 07/14/2004 Document Revised: 06/27/2014 Document Reviewed:  12/07/2012 Elsevier Interactive Patient Education 2016 Elsevier Inc.    Smoking Cessation, Tips for Success If you are ready to quit smoking, congratulations! You have chosen to help yourself be healthier. Cigarettes bring nicotine, tar, carbon monoxide, and other irritants into your body. Your lungs, heart, and blood vessels will be able to work better without these poisons. There are many different ways to quit smoking. Nicotine gum, nicotine patches, a nicotine inhaler, or nicotine nasal spray can help with physical craving. Hypnosis, support groups, and medicines help break the habit of smoking. WHAT THINGS CAN I DO TO MAKE QUITTING EASIER?  Here are some tips to help you quit for good:  Pick a date when you will quit smoking completely. Tell all of your friends and family about your plan to quit on that date.  Do not try to slowly cut down on the number of cigarettes you are smoking. Pick a quit date and quit smoking completely starting on that day.  Throw away all cigarettes.   Clean and remove all ashtrays from your home, work, and car.  On a card, write down your reasons for quitting. Carry the card with you and read it when you get the urge to smoke.  Cleanse your body of nicotine. Drink enough water and fluids to keep your urine clear or pale yellow. Do this after quitting to flush the nicotine from your body.  Learn to predict your moods. Do not let a bad situation be your excuse to have a cigarette. Some situations in your life might tempt you into wanting a cigarette.  Never have "just one" cigarette. It leads to wanting another and another. Remind yourself of your decision to quit.  Change habits associated with smoking. If you smoked while driving or when feeling stressed, try other activities to replace smoking. Stand up when drinking your coffee. Brush your teeth after eating. Sit in a different chair when you read the paper. Avoid alcohol while trying to quit, and try to  drink fewer caffeinated beverages. Alcohol and caffeine may urge you to smoke.  Avoid foods and drinks that can trigger a desire to smoke, such as sugary or spicy foods and alcohol.  Ask people who smoke not to smoke around you.  Have something planned to do right after eating or having a cup of coffee. For example, plan to take a walk or exercise.  Try a relaxation exercise to calm you down and decrease your stress. Remember, you may be tense and nervous for the first 2 weeks after you quit, but this will pass.  Find new activities to keep your hands busy. Play with a pen, coin, or rubber band. Doodle or draw things on paper.  Brush your teeth right after eating. This will help cut down on the craving for the taste of tobacco after meals. You can also try mouthwash.   Use oral substitutes in place of cigarettes. Try using lemon drops, carrots, cinnamon sticks, or chewing gum. Keep them handy so they are available when you have the urge to smoke.  When you have the urge to smoke, try deep breathing.    Designate your home as a nonsmoking area.  If you are a heavy smoker, ask your health care provider about a prescription for nicotine chewing gum. It can ease your withdrawal from nicotine.  Reward yourself. Set aside the cigarette money you save and buy yourself something nice.  Look for support from others. Join a support group or smoking cessation program. Ask someone at home or at work to help you with your plan to quit smoking.  Always ask yourself, "Do I need this cigarette or is this just a reflex?" Tell yourself, "Today, I choose not to smoke," or "I do not want to smoke." You are reminding yourself of your decision to quit.  Do not replace cigarette smoking with electronic cigarettes (commonly called e-cigarettes). The safety of e-cigarettes is unknown, and some may contain harmful chemicals.  If you relapse, do not give up! Plan ahead and think about what you will do the next  time you get the urge to smoke. HOW WILL I FEEL WHEN I QUIT SMOKING? You may have symptoms of withdrawal because your body is used to nicotine (the addictive substance in cigarettes). You may crave cigarettes, be irritable, feel very hungry, cough often, get headaches, or have difficulty concentrating. The withdrawal symptoms are only temporary. They are strongest when you first quit but will go away within 10-14 days. When withdrawal symptoms occur, stay in control. Think about your reasons for quitting. Remind yourself that these are signs that your body is healing and getting used to being without cigarettes. Remember that withdrawal symptoms are easier to treat than the major diseases that smoking can cause.  Even after the withdrawal is over, expect periodic urges to smoke. However, these cravings are generally short lived and will go away whether you smoke or not. Do not smoke! WHAT RESOURCES ARE AVAILABLE TO HELP ME QUIT SMOKING? Your health care provider can direct you to community resources or hospitals for support, which may include:  Group support.  Education.  Hypnosis.  Therapy.   This information is not intended to replace advice given to you by your health care provider. Make sure you discuss any questions you have with your health care provider.   Document Released: 03/04/2004 Document Revised: 06/27/2014 Document Reviewed: 11/22/2012 Elsevier Interactive Patient Education 2016 Elsevier Inc.   Steps to Quit Smoking  Smoking tobacco can be harmful to your health and can affect almost every organ in your body. Smoking puts you, and those around you, at risk for developing many serious chronic diseases. Quitting smoking is difficult, but it is one of the best things that you can do for your health. It is never too late to quit. WHAT ARE THE BENEFITS OF QUITTING SMOKING? When you quit smoking, you lower your risk of developing serious diseases and conditions, such as:  Lung  cancer or lung disease, such as COPD.  Heart disease.  Stroke.  Heart attack.  Infertility.  Osteoporosis and bone fractures. Additionally, symptoms such as coughing, wheezing, and shortness of breath may get better when you quit. You may also find that you get sick less often because your body is stronger at fighting off colds and infections. If you are pregnant, quitting smoking can help to reduce your chances of having a baby of low birth weight. HOW DO I GET READY TO QUIT? When you decide to quit smoking, create a plan to make sure that you are successful. Before you quit:  Pick a date to quit. Set a date within the   next two weeks to give you time to prepare.  Write down the reasons why you are quitting. Keep this list in places where you will see it often, such as on your bathroom mirror or in your car or wallet.  Identify the people, places, things, and activities that make you want to smoke (triggers) and avoid them. Make sure to take these actions:  Throw away all cigarettes at home, at work, and in your car.  Throw away smoking accessories, such as ashtrays and lighters.  Clean your car and make sure to empty the ashtray.  Clean your home, including curtains and carpets.  Tell your family, friends, and coworkers that you are quitting. Support from your loved ones can make quitting easier.  Talk with your health care provider about your options for quitting smoking.  Find out what treatment options are covered by your health insurance. WHAT STRATEGIES CAN I USE TO QUIT SMOKING?  Talk with your healthcare provider about different strategies to quit smoking. Some strategies include:  Quitting smoking altogether instead of gradually lessening how much you smoke over a period of time. Research shows that quitting "cold turkey" is more successful than gradually quitting.  Attending in-person counseling to help you build problem-solving skills. You are more likely to have  success in quitting if you attend several counseling sessions. Even short sessions of 10 minutes can be effective.  Finding resources and support systems that can help you to quit smoking and remain smoke-free after you quit. These resources are most helpful when you use them often. They can include:  Online chats with a counselor.  Telephone quitlines.  Printed self-help materials.  Support groups or group counseling.  Text messaging programs.  Mobile phone applications.  Taking medicines to help you quit smoking. (If you are pregnant or breastfeeding, talk with your health care provider first.) Some medicines contain nicotine and some do not. Both types of medicines help with cravings, but the medicines that include nicotine help to relieve withdrawal symptoms. Your health care provider may recommend:  Nicotine patches, gum, or lozenges.  Nicotine inhalers or sprays.  Non-nicotine medicine that is taken by mouth. Talk with your health care provider about combining strategies, such as taking medicines while you are also receiving in-person counseling. Using these two strategies together makes you more likely to succeed in quitting than if you used either strategy on its own. If you are pregnant or breastfeeding, talk with your health care provider about finding counseling or other support strategies to quit smoking. Do not take medicine to help you quit smoking unless told to do so by your health care provider. WHAT THINGS CAN I DO TO MAKE IT EASIER TO QUIT? Quitting smoking might feel overwhelming at first, but there is a lot that you can do to make it easier. Take these important actions:  Reach out to your family and friends and ask that they support and encourage you during this time. Call telephone quitlines, reach out to support groups, or work with a counselor for support.  Ask people who smoke to avoid smoking around you.  Avoid places that trigger you to smoke, such as bars,  parties, or smoke-break areas at work.  Spend time around people who do not smoke.  Lessen stress in your life, because stress can be a smoking trigger for some people. To lessen stress, try:  Exercising regularly.  Deep-breathing exercises.  Yoga.  Meditating.  Performing a body scan. This involves closing your eyes, scanning   your body from head to toe, and noticing which parts of your body are particularly tense. Purposefully relax the muscles in those areas.  Download or purchase mobile phone or tablet apps (applications) that can help you stick to your quit plan by providing reminders, tips, and encouragement. There are many free apps, such as QuitGuide from the CDC (Centers for Disease Control and Prevention). You can find other support for quitting smoking (smoking cessation) through smokefree.gov and other websites. HOW WILL I FEEL WHEN I QUIT SMOKING? Within the first 24 hours of quitting smoking, you may start to feel some withdrawal symptoms. These symptoms are usually most noticeable 2-3 days after quitting, but they usually do not last beyond 2-3 weeks. Changes or symptoms that you might experience include:  Mood swings.  Restlessness, anxiety, or irritation.  Difficulty concentrating.  Dizziness.  Strong cravings for sugary foods in addition to nicotine.  Mild weight gain.  Constipation.  Nausea.  Coughing or a sore throat.  Changes in how your medicines work in your body.  A depressed mood.  Difficulty sleeping (insomnia). After the first 2-3 weeks of quitting, you may start to notice more positive results, such as:  Improved sense of smell and taste.  Decreased coughing and sore throat.  Slower heart rate.  Lower blood pressure.  Clearer skin.  The ability to breathe more easily.  Fewer sick days. Quitting smoking is very challenging for most people. Do not get discouraged if you are not successful the first time. Some people need to make many  attempts to quit before they achieve long-term success. Do your best to stick to your quit plan, and talk with your health care provider if you have any questions or concerns.   This information is not intended to replace advice given to you by your health care provider. Make sure you discuss any questions you have with your health care provider.   Document Released: 05/31/2001 Document Revised: 10/21/2014 Document Reviewed: 10/21/2014 Elsevier Interactive Patient Education 2016 Elsevier Inc.   

## 2015-07-02 NOTE — Addendum Note (Signed)
Addended by: Dorthula Rue L on: 07/02/2015 11:18 AM   Modules accepted: Orders

## 2015-10-06 DIAGNOSIS — H3552 Pigmentary retinal dystrophy: Secondary | ICD-10-CM | POA: Diagnosis not present

## 2015-10-06 DIAGNOSIS — H30033 Focal chorioretinal inflammation, peripheral, bilateral: Secondary | ICD-10-CM | POA: Diagnosis not present

## 2015-10-06 DIAGNOSIS — H35353 Cystoid macular degeneration, bilateral: Secondary | ICD-10-CM | POA: Diagnosis not present

## 2015-10-19 DIAGNOSIS — H35371 Puckering of macula, right eye: Secondary | ICD-10-CM | POA: Diagnosis not present

## 2015-10-19 DIAGNOSIS — H35353 Cystoid macular degeneration, bilateral: Secondary | ICD-10-CM | POA: Diagnosis not present

## 2015-10-19 DIAGNOSIS — H30033 Focal chorioretinal inflammation, peripheral, bilateral: Secondary | ICD-10-CM | POA: Diagnosis not present

## 2015-10-19 DIAGNOSIS — H43813 Vitreous degeneration, bilateral: Secondary | ICD-10-CM | POA: Diagnosis not present

## 2015-11-20 DIAGNOSIS — H35371 Puckering of macula, right eye: Secondary | ICD-10-CM | POA: Diagnosis not present

## 2015-11-20 DIAGNOSIS — H43813 Vitreous degeneration, bilateral: Secondary | ICD-10-CM | POA: Diagnosis not present

## 2015-11-20 DIAGNOSIS — H35353 Cystoid macular degeneration, bilateral: Secondary | ICD-10-CM | POA: Diagnosis not present

## 2015-12-25 ENCOUNTER — Encounter: Payer: Self-pay | Admitting: Family

## 2015-12-28 DIAGNOSIS — G4733 Obstructive sleep apnea (adult) (pediatric): Secondary | ICD-10-CM | POA: Diagnosis not present

## 2015-12-28 DIAGNOSIS — Z9989 Dependence on other enabling machines and devices: Secondary | ICD-10-CM | POA: Diagnosis not present

## 2015-12-28 DIAGNOSIS — Z462 Encounter for fitting and adjustment of other devices related to nervous system and special senses: Secondary | ICD-10-CM | POA: Diagnosis not present

## 2015-12-30 DIAGNOSIS — G4733 Obstructive sleep apnea (adult) (pediatric): Secondary | ICD-10-CM | POA: Diagnosis not present

## 2015-12-31 ENCOUNTER — Encounter: Payer: Self-pay | Admitting: Family

## 2015-12-31 ENCOUNTER — Ambulatory Visit (INDEPENDENT_AMBULATORY_CARE_PROVIDER_SITE_OTHER): Payer: Federal, State, Local not specified - PPO | Admitting: Family

## 2015-12-31 ENCOUNTER — Ambulatory Visit (HOSPITAL_COMMUNITY)
Admission: RE | Admit: 2015-12-31 | Discharge: 2015-12-31 | Disposition: A | Discharge status: Home / Self Care | Payer: Federal, State, Local not specified - PPO | Source: Ambulatory Visit | Attending: Family | Admitting: Family

## 2015-12-31 VITALS — BP 123/80 | HR 75 | Temp 98.5°F | Ht 64.0 in | Wt 204.0 lb

## 2015-12-31 DIAGNOSIS — Z720 Tobacco use: Secondary | ICD-10-CM

## 2015-12-31 DIAGNOSIS — I639 Cerebral infarction, unspecified: Secondary | ICD-10-CM

## 2015-12-31 DIAGNOSIS — I10 Essential (primary) hypertension: Secondary | ICD-10-CM | POA: Diagnosis not present

## 2015-12-31 DIAGNOSIS — I6523 Occlusion and stenosis of bilateral carotid arteries: Secondary | ICD-10-CM | POA: Insufficient documentation

## 2015-12-31 DIAGNOSIS — F17200 Nicotine dependence, unspecified, uncomplicated: Secondary | ICD-10-CM

## 2015-12-31 DIAGNOSIS — E669 Obesity, unspecified: Secondary | ICD-10-CM | POA: Diagnosis not present

## 2015-12-31 DIAGNOSIS — E7800 Pure hypercholesterolemia, unspecified: Secondary | ICD-10-CM | POA: Insufficient documentation

## 2015-12-31 LAB — VAS US CAROTID
LCCAPSYS: 126 cm/s
LEFT ECA DIAS: -38 cm/s
LEFT VERTEBRAL DIAS: -17 cm/s
LICADDIAS: -42 cm/s
Left CCA dist dias: -26 cm/s
Left CCA dist sys: -79 cm/s
Left CCA prox dias: 36 cm/s
Left ICA dist sys: -153 cm/s
Left ICA prox dias: -105 cm/s
Left ICA prox sys: -228 cm/s
RCCADSYS: -95 cm/s
RCCAPDIAS: 35 cm/s
RIGHT CCA MID DIAS: -26 cm/s
RIGHT ECA DIAS: 25 cm/s
RIGHT VERTEBRAL DIAS: -26 cm/s
Right CCA prox sys: 108 cm/s

## 2015-12-31 NOTE — Progress Notes (Signed)
Chief Complaint: Follow up Extracranial Carotid Artery Stenosis   History of Present Illness  Susan Farley is a 55 y.o. female patient of Dr. Donnetta Hutching who returns today for routine extracranial carotid artery surveillance.  She was admitted to [facility] on on 11/ 5/15 with a diagnosis of left thalamic infarct. She presented with numbness on her right face arm and leg. Evaluation included carotid duplex suggesting moderate left carotid stenosis. She had a CT angiogram of her neck for further evaluation of this for review as well. She has ongoing intermittent right neck and shoulder soreness has no residual symptoms. She is a long-term cigarette smoker and is trying to cut back. No history of cardiac difficulty. She denies residual right hemiparesis. She denies any subsequent stroke or TIA.  The patient denies a history of amaurosis fugax or monocular blindness, denies a history unilateral of facial drooping, and denies a history of receptive or expressive aphasia.   The patient denies New Medical or Surgical History.  She denies claudication sx's with walking, denies non healing wounds.  Her chief complaint is ongoing right shoulder pain since her stroke in November 2015; gabapentin has not helped this. She states she knows she has a heart murmur. 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   Pt Diabetic: yes, pt showed me on her phone her lab results from 06/18/15, A1C was 6.4; her DM was diagnosed when she had her stroke in November 2015,states her DM is managed by her P[facility] with [facility]   Pt smoker: smoker (1.5 ppd, started smoking at age 88 yrs)  Pt meds include: Statin : yes ASA: yes,  325 mg daily Other anticoagulants/antiplatelets: no    Past Medical History  Diagnosis Date  . Hypertension   . Hypercholesteremia   . Retinitis pigmentosa   . Colitis   . Stroke [facility]) Nov. 2015    Social History Social History  Substance Use Topics  .  Smoking status: Current Every Day Smoker -- 1.00 packs/day    Types: Cigarettes  . Smokeless tobacco: Never Used     Comment: Uses a Vaporized cigarette.   . Alcohol Use: No    Family History Family History  Problem Relation Age of Onset  . Adopted: Yes  . Diabetes      BIOLOGICAL AUNT  . Heart disease      BIOLOGICAL AUNT    Surgical History Past Surgical History  Procedure Laterality Date  . Cataract extraction    . Oophorectomy    . Bone graft hip iliac crest    . Iud removal      Allergies  Allergen Reactions  . Shellfish Allergy Anaphylaxis    Current Outpatient Prescriptions  Medication Sig Dispense Refill  . aspirin EC 325 MG EC tablet Take 1 tablet (325 mg total) by mouth daily. 30 tablet 0  . docusate sodium (COLACE) 100 MG capsule Take 100 mg by mouth at bedtime as needed for mild constipation.    . dorzolamide (TRUSOPT) 2 % ophthalmic solution Place 1 drop into both eyes 2 (two) times daily.    Marland Kitchen gabapentin (NEURONTIN) 100 MG capsule Take 100 mg by mouth 3 (three) times daily. Reported on 07/01/2015  3  . hydrochlorothiazide (HYDRODIURIL) 25 MG tablet Take 25 mg by mouth daily.    . pantoprazole (PROTONIX) 40 MG tablet Take 40 mg by mouth daily.     . QUEtiapine (SEROQUEL) 50 MG tablet Take 25 mg by mouth at bedtime.    Marland Kitchen  sertraline (ZOLOFT) 100 MG tablet Take 100 mg by mouth daily. Reported on 07/01/2015    . timolol (BETIMOL) 0.5 % ophthalmic solution Place 1 drop into both eyes 2 (two) times daily.    Marland Kitchen venlafaxine XR (EFFEXOR-XR) 150 MG 24 hr capsule Take 150 mg by mouth daily with breakfast. Reported on 07/01/2015    . Vitamin D, Ergocalciferol, (DRISDOL) 50000 UNITS CAPS capsule Take 50,000 Units by mouth every 7 (seven) days.     No current facility-administered medications for this visit.    Review of Systems : See HPI for pertinent positives and negatives.  Physical Examination  Filed Vitals:   12/31/15 1139 12/31/15 1141  BP: 120/83 123/80   Pulse: 75   Temp: 98.5 F (36.9 C)   TempSrc: Oral   Height: '5\' 4"'$  (1.626 m)   Weight: 204 lb (92.534 kg)   SpO2: 95%    Body mass index is 35 kg/(m^2).  General: WDWN obese female in NAD GAIT: normal Eyes: PERRLA Pulmonary: Respirations are non-labored, CTAB, no rales, rhonchi, or wheezing.  Cardiac: regular rhythm, + murmur.  VASCULAR EXAM Carotid Bruits Right Left   Negative Negative   Aorta is not palpable. Radial pulses are 2+ palpable and equal.      LE Pulses Right Left   POPLITEAL not palpable  not palpable   POSTERIOR TIBIAL  palpable   palpable    DORSALIS PEDIS  ANTERIOR TIBIAL palpable  palpable     Gastrointestinal: soft, nontender, BS WNL, no r/g, no palpable masses.  Musculoskeletal: No muscle atrophy/wasting. M/S 5/5 throughout, extremities without ischemic changes.  Neurologic: A&O X 3; Appropriate Affect, Speech is normal CN 2-12 intact, pain and light touch intact in extremities, Motor exam as listed above.          Non-Invasive Vascular Imaging CAROTID DUPLEX 12/31/2015   CEREBROVASCULAR DUPLEX EVALUATION    INDICATION: Carotid artery disease    PREVIOUS INTERVENTION(S): None    DUPLEX EXAM: Carotid duplex    RIGHT  LEFT  Peak Systolic Velocities (cm/s) End Diastolic Velocities (cm/s) Plaque LOCATION Peak Systolic Velocities (cm/s) End Diastolic Velocities (cm/s) Plaque  108 35 HM CCA PROXIMAL 126 36   76 26  CCA MID 86 26   72 21 HM CCA DISTAL 79 26 HT  435 144 HM ECA 253 47   79 31  ICA PROXIMAL 220 108 HT  92 39  ICA MID 153 42   97 40  ICA DISTAL 72 32     1.0 ICA / CCA Ratio (PSV) 2.5  Antegrade Vertebral Flow Antegrade  - Brachial Systolic Pressure (mmHg) -  Triphasic Brachial Artery Waveforms Triphasic    Plaque Morphology:  HM = Homogeneous,  HT = Heterogeneous, CP = Calcific Plaque, SP = Smooth Plaque, IP = Irregular Plaque     ADDITIONAL FINDINGS: Multiphasic subclavian arteries    IMPRESSION: 1. Less than 40% right internal carotid artery stenosis 2. 60 - 79% left internal carotid artery stenosis, higher end of range 3. Bilateral external carotid artery stenosis.    Compared to the previous exam:  No significant change since exam of 06/26/2015      Assessment: Susan Farley is a 55 y.o. 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 The hemiparesis has resolved, but what does remain is intermittent right side neck and right shoulder pain that improves in the warmer weather and with topical NSAID.  She has not had any subsequent stroke or TIA.  Today's carotid duplex suggests less than 40% right ICA stenosis and left internal carotid artery stenosis present in the high end 60%-79% range. Bilateral external carotid artery stenosis present. Essentially unchanged since previous studies on 04/25/2014 and 06/26/15.   Her atherosclerotic risk factors include controlled DM, 1.5 ppd smoking since age 74, and obesity.    Plan:  The patient was counseled re smoking cessation and given several free resources re smoking cessation.   Follow-up in 6 months with Carotid Duplex scan.   I discussed in depth with the patient the nature of atherosclerosis, and emphasized the importance of maximal medical management including strict control of blood pressure, blood glucose, and lipid levels, obtaining regular exercise, and cessation of smoking.  The patient is aware that without maximal medical management the underlying atherosclerotic disease process will progress, limiting the benefit of any interventions. The patient was given information about stroke prevention and what symptoms should prompt the patient to seek immediate medical care. Thank you for allowing [place] to participate in this patient's care.  Clemon Chambers,  RN, MSN, FNP-C [facility] Office: [facility] Physician: Oneida Alar  12/31/2015 11:52 AM

## 2015-12-31 NOTE — Patient Instructions (Signed)
Stroke Prevention Some medical conditions and behaviors are associated with an increased chance of having a stroke. You may prevent a stroke by making healthy choices and managing medical conditions. HOW CAN I REDUCE MY RISK OF HAVING A STROKE?   Stay physically active. Get at least 30 minutes of activity on most or all days.  Do not smoke. It may also be helpful to avoid exposure to secondhand smoke.  Limit alcohol use. Moderate alcohol use is considered to be:  No more than 2 drinks per day for men.  No more than 1 drink per day for nonpregnant women.  Eat healthy foods. This involves:  Eating 5 or more servings of fruits and vegetables a day.  Making dietary changes that address high blood pressure (hypertension), high cholesterol, diabetes, or obesity.  Manage your cholesterol levels.  Making food choices that are high in fiber and low in saturated fat, trans fat, and cholesterol may control cholesterol levels.  Take any prescribed medicines to control cholesterol as directed by your health care provider.  Manage your diabetes.  Controlling your carbohydrate and sugar intake is recommended to manage diabetes.  Take any prescribed medicines to control diabetes as directed by your health care provider.  Control your hypertension.  Making food choices that are low in salt (sodium), saturated fat, trans fat, and cholesterol is recommended to manage hypertension.  Ask your health care provider if you need treatment to lower your blood pressure. Take any prescribed medicines to control hypertension as directed by your health care provider.  If you are 18-39 years of age, have your blood pressure checked every 3-5 years. If you are 40 years of age or older, have your blood pressure checked every year.  Maintain a healthy weight.  Reducing calorie intake and making food choices that are low in sodium, saturated fat, trans fat, and cholesterol are recommended to manage  weight.  Stop drug abuse.  Avoid taking birth control pills.  Talk to your health care provider about the risks of taking birth control pills if you are over 35 years old, smoke, get migraines, or have ever had a blood clot.  Get evaluated for sleep disorders (sleep apnea).  Talk to your health care provider about getting a sleep evaluation if you snore a lot or have excessive sleepiness.  Take medicines only as directed by your health care provider.  For some people, aspirin or blood thinners (anticoagulants) are helpful in reducing the risk of forming abnormal blood clots that can lead to stroke. If you have the irregular heart rhythm of atrial fibrillation, you should be on a blood thinner unless there is a good reason you cannot take them.  Understand all your medicine instructions.  Make sure that other conditions (such as anemia or atherosclerosis) are addressed. SEEK IMMEDIATE MEDICAL CARE IF:   You have sudden weakness or numbness of the face, arm, or leg, especially on one side of the body.  Your face or eyelid droops to one side.  You have sudden confusion.  You have trouble speaking (aphasia) or understanding.  You have sudden trouble seeing in one or both eyes.  You have sudden trouble walking.  You have dizziness.  You have a loss of balance or coordination.  You have a sudden, severe headache with no known cause.  You have new chest pain or an irregular heartbeat. Any of these symptoms may represent a serious problem that is an emergency. Do not wait to see if the symptoms will   go away. Get medical help at once. Call your local emergency services (911 in [place] Do not drive yourself to the hospital.   This information is not intended to replace advice given to you by your health care provider. Make sure you discuss any questions you have with your health care provider.   Document Released: 07/14/2004 Document Revised: 06/27/2014 Document Reviewed:  12/07/2012 Elsevier Interactive Patient Education 2016 Elsevier Inc.     Steps to Quit Smoking  Smoking tobacco can be harmful to your health and can affect almost every organ in your body. Smoking puts you, and those around you, at risk for developing many serious chronic diseases. Quitting smoking is difficult, but it is one of the best things that you can do for your health. It is never too late to quit. WHAT ARE THE BENEFITS OF QUITTING SMOKING? When you quit smoking, you lower your risk of developing serious diseases and conditions, such as:  Lung cancer or lung disease, such as COPD.  Heart disease.  Stroke.  Heart attack.  Infertility.  Osteoporosis and bone fractures. Additionally, symptoms such as coughing, wheezing, and shortness of breath may get better when you quit. You may also find that you get sick less often because your body is stronger at fighting off colds and infections. If you are pregnant, quitting smoking can help to reduce your chances of having a baby of low birth weight. HOW DO I GET READY TO QUIT? When you decide to quit smoking, create a plan to make sure that you are successful. Before you quit:  Pick a date to quit. Set a date within the next two weeks to give you time to prepare.  Write down the reasons why you are quitting. Keep this list in places where you will see it often, such as on your bathroom mirror or in your car or wallet.  Identify the people, places, things, and activities that make you want to smoke (triggers) and avoid them. Make sure to take these actions:  Throw away all cigarettes at home, at work, and in your car.  Throw away smoking accessories, such as ashtrays and lighters.  Clean your car and make sure to empty the ashtray.  Clean your home, including curtains and carpets.  Tell your family, friends, and coworkers that you are quitting. Support from your loved ones can make quitting easier.  Talk with your health care  provider about your options for quitting smoking.  Find out what treatment options are covered by your health insurance. WHAT STRATEGIES CAN I USE TO QUIT SMOKING?  Talk with your healthcare provider about different strategies to quit smoking. Some strategies include:  Quitting smoking altogether instead of gradually lessening how much you smoke over a period of time. Research shows that quitting "cold turkey" is more successful than gradually quitting.  Attending in-person counseling to help you build problem-solving skills. You are more likely to have success in quitting if you attend several counseling sessions. Even short sessions of 10 minutes can be effective.  Finding resources and support systems that can help you to quit smoking and remain smoke-free after you quit. These resources are most helpful when you use them often. They can include:  Online chats with a counselor.  Telephone quitlines.  Printed self-help materials.  Support groups or group counseling.  Text messaging programs.  Mobile phone applications.  Taking medicines to help you quit smoking. (If you are pregnant or breastfeeding, talk with your health care provider first.) Some   medicines contain nicotine and some do not. Both types of medicines help with cravings, but the medicines that include nicotine help to relieve withdrawal symptoms. Your health care provider may recommend:  Nicotine patches, gum, or lozenges.  Nicotine inhalers or sprays.  Non-nicotine medicine that is taken by mouth. Talk with your health care provider about combining strategies, such as taking medicines while you are also receiving in-person counseling. Using these two strategies together makes you more likely to succeed in quitting than if you used either strategy on its own. If you are pregnant or breastfeeding, talk with your health care provider about finding counseling or other support strategies to quit smoking. Do not take  medicine to help you quit smoking unless told to do so by your health care provider. WHAT THINGS CAN I DO TO MAKE IT EASIER TO QUIT? Quitting smoking might feel overwhelming at first, but there is a lot that you can do to make it easier. Take these important actions:  Reach out to your family and friends and ask that they support and encourage you during this time. Call telephone quitlines, reach out to support groups, or work with a counselor for support.  Ask people who smoke to avoid smoking around you.  Avoid places that trigger you to smoke, such as bars, parties, or smoke-break areas at work.  Spend time around people who do not smoke.  Lessen stress in your life, because stress can be a smoking trigger for some people. To lessen stress, try:  Exercising regularly.  Deep-breathing exercises.  Yoga.  Meditating.  Performing a body scan. This involves closing your eyes, scanning your body from head to toe, and noticing which parts of your body are particularly tense. Purposefully relax the muscles in those areas.  Download or purchase mobile phone or tablet apps (applications) that can help you stick to your quit plan by providing reminders, tips, and encouragement. There are many free apps, such as QuitGuide from the CDC (Centers for Disease Control and Prevention). You can find other support for quitting smoking (smoking cessation) through smokefree.gov and other websites. HOW WILL I FEEL WHEN I QUIT SMOKING? Within the first 24 hours of quitting smoking, you may start to feel some withdrawal symptoms. These symptoms are usually most noticeable 2-3 days after quitting, but they usually do not last beyond 2-3 weeks. Changes or symptoms that you might experience include:  Mood swings.  Restlessness, anxiety, or irritation.  Difficulty concentrating.  Dizziness.  Strong cravings for sugary foods in addition to nicotine.  Mild weight  gain.  Constipation.  Nausea.  Coughing or a sore throat.  Changes in how your medicines work in your body.  A depressed mood.  Difficulty sleeping (insomnia). After the first 2-3 weeks of quitting, you may start to notice more positive results, such as:  Improved sense of smell and taste.  Decreased coughing and sore throat.  Slower heart rate.  Lower blood pressure.  Clearer skin.  The ability to breathe more easily.  Fewer sick days. Quitting smoking is very challenging for most people. Do not get discouraged if you are not successful the first time. Some people need to make many attempts to quit before they achieve long-term success. Do your best to stick to your quit plan, and talk with your health care provider if you have any questions or concerns.   This information is not intended to replace advice given to you by your health care provider. Make sure you discuss any questions   you have with your health care provider.   Document Released: 05/31/2001 Document Revised: 10/21/2014 Document Reviewed: 10/21/2014 Elsevier Interactive Patient Education 2016 Elsevier Inc.  

## 2016-01-07 DIAGNOSIS — E119 Type 2 diabetes mellitus without complications: Secondary | ICD-10-CM | POA: Diagnosis not present

## 2016-01-19 DIAGNOSIS — H3552 Pigmentary retinal dystrophy: Secondary | ICD-10-CM | POA: Diagnosis not present

## 2016-01-19 DIAGNOSIS — H43813 Vitreous degeneration, bilateral: Secondary | ICD-10-CM | POA: Diagnosis not present

## 2016-01-19 DIAGNOSIS — H35353 Cystoid macular degeneration, bilateral: Secondary | ICD-10-CM | POA: Diagnosis not present

## 2016-01-19 DIAGNOSIS — H35371 Puckering of macula, right eye: Secondary | ICD-10-CM | POA: Diagnosis not present

## 2016-01-26 DIAGNOSIS — I10 Essential (primary) hypertension: Secondary | ICD-10-CM | POA: Diagnosis not present

## 2016-01-26 DIAGNOSIS — H3552 Pigmentary retinal dystrophy: Secondary | ICD-10-CM | POA: Diagnosis not present

## 2016-01-26 DIAGNOSIS — H18411 Arcus senilis, right eye: Secondary | ICD-10-CM | POA: Diagnosis not present

## 2016-01-26 DIAGNOSIS — H401131 Primary open-angle glaucoma, bilateral, mild stage: Secondary | ICD-10-CM | POA: Diagnosis not present

## 2016-01-26 DIAGNOSIS — E119 Type 2 diabetes mellitus without complications: Secondary | ICD-10-CM | POA: Diagnosis not present

## 2016-02-01 DIAGNOSIS — H527 Unspecified disorder of refraction: Secondary | ICD-10-CM | POA: Diagnosis not present

## 2016-03-22 ENCOUNTER — Ambulatory Visit (INDEPENDENT_AMBULATORY_CARE_PROVIDER_SITE_OTHER): Payer: Federal, State, Local not specified - PPO | Admitting: Podiatry

## 2016-03-22 ENCOUNTER — Encounter: Payer: Self-pay | Admitting: Podiatry

## 2016-03-22 VITALS — BP 130/68 | HR 54 | Resp 16

## 2016-03-22 DIAGNOSIS — L608 Other nail disorders: Secondary | ICD-10-CM

## 2016-03-22 DIAGNOSIS — R0989 Other specified symptoms and signs involving the circulatory and respiratory systems: Secondary | ICD-10-CM | POA: Diagnosis not present

## 2016-03-22 NOTE — Progress Notes (Signed)
   Subjective:    Patient ID: Susan Farley, female    DOB: [birth year], 55 y.o.   MRN: [id number]  HPI   This patient presents today complaining of intermittent pain in the right and left great toenails when walking and standing over 2+ year history. Patient denies any  infection in the toes. She has a history of the nail sloughing when she was in the [employer] and has a history of removal of the margins in the 1990s. Since this time patient has had intermittent discomfort in the hallux toenails bilaterally. Patient also describes some dry cracking heels that time which she self treats with topical medications which controls the dryness and cracking. Also patient states that she has a painful plantar callus on the fifth left MPJ and currently wears a soft insole with a pad and a cut out to offload this area the lesion is rather chronic and reoccurs   Patient is diabetic and denies any history of claudication, amputation or open sores Patient has a history of CVA approximately 2 years ago with some numbness she says in her neck area  Patient is smoker 30 years smoking one half pack cigarettes daily     Review of Systems  Neurological: Positive for headaches.  All other systems reviewed and are negative.      Objective:   Physical Exam   Orientated 3  Vascular: No calf edema calf tenderness bilaterally DP right 0/4 and DP left 1/4 PT right 2/4 PT left 0/4 Capillary reflex delayed bilaterally  Neurological: Sensation to 10 g monofilament 5/5 bilaterally Vibratory sensation reactive bilaterally Ankle reflex equal and reactive bilaterally  Dermatological: No open skin lesions bilaterally Right hallux nail margins are narrow from previous surgery with slight disc colorization of the distal right hallux. There is no tenderness to direct pressure on the right hallux The left hallux nail margins have been removed surgically with residual incurvation of the medial lateral borders  with slight tenderness to direct palpation. There is no surrounding erythema, edema, warmth or drainage  Musculoskeletal: No deformities noted bilaterally Manual motor testing: Dorsi flexion, plantar flexion, inversion, eversion 5/5 bilaterally        Assessment & Plan:   Assessment: Absent pedal pulses Rule out peripheral arterial disease Protective sensation intact Mildly incurvated left hallux toenail without a clinical sign of infection Plantar callus fifth left MPJ  Plan: I reviewed the results of the exam with patient today and made her aware that the left hallux nail was mildly incurvated which with direct pressure might cause some slight discomfort along the borders of the toenail. I'm not recommending any active treatment at this time Refer patient to the vascular lab for lower extremity arterial Doppler with ABIs and TBI's for the indication of diabetic with a history of absent pedal pulses Debride plantar callus left Maintain soft insole in shoes  Contact patient when vascular lab is available

## 2016-03-22 NOTE — Patient Instructions (Signed)
Today your diabetic foot screen demonstrated adequate feeling in your feet. Some of the pulses in your feet were hard to feel and I'm referring you to a vascular lab to evaluate your circulation in your legs and feet and will notify you of the results There is no obvious infection around the toenails on your right and left feet.  Diabetes and Foot Care Diabetes may cause you to have problems because of poor blood supply (circulation) to your feet and legs. This may cause the skin on your feet to become thinner, break easier, and heal more slowly. Your skin may become dry, and the skin may peel and crack. You may also have nerve damage in your legs and feet causing decreased feeling in them. You may not notice minor injuries to your feet that could lead to infections or more serious problems. Taking care of your feet is one of the most important things you can do for yourself.  HOME CARE INSTRUCTIONS  Wear shoes at all times, even in the house. Do not go barefoot. Bare feet are easily injured.  Check your feet daily for blisters, cuts, and redness. If you cannot see the bottom of your feet, use a mirror or ask someone for help.  Wash your feet with warm water (do not use hot water) and mild soap. Then pat your feet and the areas between your toes until they are completely dry. Do not soak your feet as this can dry your skin.  Apply a moisturizing lotion or petroleum jelly (that does not contain alcohol and is unscented) to the skin on your feet and to dry, brittle toenails. Do not apply lotion between your toes.  Trim your toenails straight across. Do not dig under them or around the cuticle. File the edges of your nails with an emery board or nail file.  Do not cut corns or calluses or try to remove them with medicine.  Wear clean socks or stockings every day. Make sure they are not too tight. Do not wear knee-high stockings since they may decrease blood flow to your legs.  Wear shoes that fit  properly and have enough cushioning. To break in new shoes, wear them for just a few hours a day. This prevents you from injuring your feet. Always look in your shoes before you put them on to be sure there are no objects inside.  Do not cross your legs. This may decrease the blood flow to your feet.  If you find a minor scrape, cut, or break in the skin on your feet, keep it and the skin around it clean and dry. These areas may be cleansed with mild soap and water. Do not cleanse the area with peroxide, alcohol, or iodine.  When you remove an adhesive bandage, be sure not to damage the skin around it.  If you have a wound, look at it several times a day to make sure it is healing.  Do not use heating pads or hot water bottles. They may burn your skin. If you have lost feeling in your feet or legs, you may not know it is happening until it is too late.  Make sure your health care provider performs a complete foot exam at least annually or more often if you have foot problems. Report any cuts, sores, or bruises to your health care provider immediately. SEEK MEDICAL CARE IF:   You have an injury that is not healing.  You have cuts or breaks in the skin.  You have an ingrown nail.  You notice redness on your legs or feet.  You feel burning or tingling in your legs or feet.  You have pain or cramps in your legs and feet.  Your legs or feet are numb.  Your feet always feel cold. SEEK IMMEDIATE MEDICAL CARE IF:   There is increasing redness, swelling, or pain in or around a wound.  There is a red line that goes up your leg.  Pus is coming from a wound.  You develop a fever or as directed by your health care provider.  You notice a bad smell coming from an ulcer or wound.   This information is not intended to replace advice given to you by your health care provider. Make sure you discuss any questions you have with your health care provider.   Document Released: 06/03/2000  Document Revised: 02/06/2013 Document Reviewed: 11/13/2012 Elsevier Interactive Patient Education [employer].

## 2016-03-22 NOTE — Addendum Note (Signed)
Addended by: Harriett Sine D on: 03/22/2016 09:48 AM   Modules accepted: Orders

## 2016-03-24 DIAGNOSIS — H43813 Vitreous degeneration, bilateral: Secondary | ICD-10-CM | POA: Diagnosis not present

## 2016-03-24 DIAGNOSIS — R51 Headache: Secondary | ICD-10-CM | POA: Diagnosis not present

## 2016-03-24 DIAGNOSIS — H3552 Pigmentary retinal dystrophy: Secondary | ICD-10-CM | POA: Diagnosis not present

## 2016-03-26 DIAGNOSIS — H6982 Other specified disorders of Eustachian tube, left ear: Secondary | ICD-10-CM | POA: Diagnosis not present

## 2016-03-26 DIAGNOSIS — E119 Type 2 diabetes mellitus without complications: Secondary | ICD-10-CM | POA: Diagnosis not present

## 2016-03-26 DIAGNOSIS — K5900 Constipation, unspecified: Secondary | ICD-10-CM | POA: Diagnosis not present

## 2016-03-29 DIAGNOSIS — N898 Other specified noninflammatory disorders of vagina: Secondary | ICD-10-CM | POA: Diagnosis not present

## 2016-03-29 DIAGNOSIS — Z1272 Encounter for screening for malignant neoplasm of vagina: Secondary | ICD-10-CM | POA: Diagnosis not present

## 2016-03-29 DIAGNOSIS — E119 Type 2 diabetes mellitus without complications: Secondary | ICD-10-CM | POA: Diagnosis not present

## 2016-03-29 DIAGNOSIS — Z720 Tobacco use: Secondary | ICD-10-CM | POA: Diagnosis not present

## 2016-03-29 DIAGNOSIS — Z6836 Body mass index (BMI) 36.0-36.9, adult: Secondary | ICD-10-CM | POA: Diagnosis not present

## 2016-03-29 DIAGNOSIS — E669 Obesity, unspecified: Secondary | ICD-10-CM | POA: Diagnosis not present

## 2016-03-31 ENCOUNTER — Other Ambulatory Visit: Payer: Self-pay | Admitting: Podiatry

## 2016-04-01 ENCOUNTER — Other Ambulatory Visit: Payer: Self-pay | Admitting: Podiatry

## 2016-04-01 DIAGNOSIS — R0989 Other specified symptoms and signs involving the circulatory and respiratory systems: Secondary | ICD-10-CM

## 2016-04-05 ENCOUNTER — Ambulatory Visit (HOSPITAL_COMMUNITY)
Admission: RE | Admit: 2016-04-05 | Discharge: 2016-04-05 | Disposition: A | Discharge status: Home / Self Care | Payer: Federal, State, Local not specified - PPO | Source: Ambulatory Visit | Attending: Cardiovascular Disease | Admitting: Cardiovascular Disease

## 2016-04-05 DIAGNOSIS — R938 Abnormal findings on diagnostic imaging of other specified body structures: Secondary | ICD-10-CM | POA: Insufficient documentation

## 2016-04-05 DIAGNOSIS — E785 Hyperlipidemia, unspecified: Secondary | ICD-10-CM | POA: Insufficient documentation

## 2016-04-05 DIAGNOSIS — E119 Type 2 diabetes mellitus without complications: Secondary | ICD-10-CM | POA: Diagnosis not present

## 2016-04-05 DIAGNOSIS — R0989 Other specified symptoms and signs involving the circulatory and respiratory systems: Secondary | ICD-10-CM | POA: Insufficient documentation

## 2016-04-05 DIAGNOSIS — Z8673 Personal history of transient ischemic attack (TIA), and cerebral infarction without residual deficits: Secondary | ICD-10-CM | POA: Insufficient documentation

## 2016-04-05 DIAGNOSIS — I70213 Atherosclerosis of native arteries of extremities with intermittent claudication, bilateral legs: Secondary | ICD-10-CM | POA: Diagnosis not present

## 2016-04-05 DIAGNOSIS — I10 Essential (primary) hypertension: Secondary | ICD-10-CM | POA: Diagnosis not present

## 2016-04-05 DIAGNOSIS — Z720 Tobacco use: Secondary | ICD-10-CM | POA: Diagnosis not present

## 2016-04-08 ENCOUNTER — Telehealth: Payer: Self-pay | Admitting: *Deleted

## 2016-04-08 DIAGNOSIS — R0989 Other specified symptoms and signs involving the circulatory and respiratory systems: Secondary | ICD-10-CM

## 2016-04-08 NOTE — Telephone Encounter (Addendum)
-----   Message from Gean Birchwood, DPM sent at 04/08/2016  9:45 AM EDT ----- Contact Dr. Gwenlyn Found and ask if they want a consult on this patient No evidence of segmental lower extremity arterial disease at rest, bilaterally. Normal ABI's, bilaterally. Bilateral great toe pressures are abnormal. Informed pt of the results and the referral, pt states understanding.

## 2016-04-12 ENCOUNTER — Telehealth: Payer: Self-pay | Admitting: Cardiovascular Disease

## 2016-04-12 NOTE — Telephone Encounter (Signed)
Closed encounter °

## 2016-04-13 ENCOUNTER — Encounter: Payer: Self-pay | Admitting: Podiatry

## 2016-04-14 ENCOUNTER — Telehealth: Payer: Self-pay | Admitting: *Deleted

## 2016-04-18 NOTE — Telephone Encounter (Addendum)
Pt states she is calling in reference to a referral.Pt states she was referred from the [place] [facility] [place] states if we send them paperwork they will pay. Pt states spoke to the person in [place] [phone] or [phone]. 07/04/2015-I spoke with [facility], informed of Dr. Phoebe Perch request for B/L [place] with ABI and TBI need for exam and facility ordered and contact phone, she states she will send to pt's provider and the provider will send notice of referral.

## 2016-04-26 ENCOUNTER — Encounter: Payer: Self-pay | Admitting: Cardiovascular Disease

## 2016-04-26 ENCOUNTER — Ambulatory Visit (INDEPENDENT_AMBULATORY_CARE_PROVIDER_SITE_OTHER): Payer: Federal, State, Local not specified - PPO | Admitting: Cardiovascular Disease

## 2016-04-26 VITALS — BP 128/76 | HR 77 | Ht 64.0 in | Wt 212.2 lb

## 2016-04-26 DIAGNOSIS — I10 Essential (primary) hypertension: Secondary | ICD-10-CM

## 2016-04-26 DIAGNOSIS — F17200 Nicotine dependence, unspecified, uncomplicated: Secondary | ICD-10-CM | POA: Diagnosis not present

## 2016-04-26 DIAGNOSIS — E7800 Pure hypercholesterolemia, unspecified: Secondary | ICD-10-CM

## 2016-04-26 DIAGNOSIS — I739 Peripheral vascular disease, unspecified: Secondary | ICD-10-CM | POA: Diagnosis not present

## 2016-04-26 NOTE — Progress Notes (Signed)
04/26/2016 INDICA MARCOTT   [birth year]  [id number]  Primary Physician No PCP Per Patient Primary Cardiologist: Lorretta Harp MD Renae Gloss  HPI:  Ms Blough is a 55 year old mildly overweight single Afro-American female with no children referred by Dr. Amalia Hailey, her podiatrist, for peripheral vascular evaluation because of absent pedal pulses. Factors include post 80-pack-years of tobacco abuse continuing to smoke 2 packs of cigarettes a day, treated hypertension, hyperlipidemia. She was a diabetic until significant weight loss recently. She's had a left thalamic stroke 04/24/14 and does have moderately severe right ICA stenosis followed by Dr. Donnetta Hutching. She has never had a heart attack. She denies chest pain or shortness of breath. Lower extremity Dopplers performed in our office showed normal ABIs bilaterally with mildly elevated velocities in her SFAs bilaterally. She denies claudication.   Current Outpatient Prescriptions  Medication Sig Dispense Refill  . aspirin EC 325 MG EC tablet Take 1 tablet (325 mg total) by mouth daily. 30 tablet 0  . docusate sodium (COLACE) 100 MG capsule Take 100 mg by mouth at bedtime as needed for mild constipation.    . dorzolamide (TRUSOPT) 2 % ophthalmic solution Place 1 drop into both eyes 2 (two) times daily.    . hydrochlorothiazide (HYDRODIURIL) 25 MG tablet Take 25 mg by mouth daily.    . pantoprazole (PROTONIX) 40 MG tablet Take 40 mg by mouth daily.     . QUEtiapine (SEROQUEL) 50 MG tablet Take 25 mg by mouth at bedtime.    . simvastatin (ZOCOR) 40 MG tablet Take 40 mg by mouth daily.    . timolol (BETIMOL) 0.5 % ophthalmic solution Place 1 drop into both eyes 2 (two) times daily.    Marland Kitchen venlafaxine XR (EFFEXOR-XR) 150 MG 24 hr capsule Take 150 mg by mouth daily with breakfast. Reported on 07/01/2015    . Vitamin D, Ergocalciferol, (DRISDOL) 50000 UNITS CAPS capsule Take 50,000 Units by mouth every 7 (seven) days.     No current  facility-administered medications for this visit.     Allergies  Allergen Reactions  . Shellfish Allergy Anaphylaxis    Social History   Social History  . Marital status: Single    Spouse name: N/A  . Number of children: 0  . Years of education: 14    Occupational History  . Retired  [employer]   Social History Main Topics  . Smoking status: Current Every Day Smoker    Packs/day: 1.00    Types: Cigarettes  . Smokeless tobacco: Never Used     Comment: Uses a Vaporized cigarette.   . Alcohol use No  . Drug use: No  . Sexual activity: Not on file   Other Topics Concern  . Not on file   Social History Narrative   Patient is single with no children.   Patient is right handed.   Patient has 14 yrs of education.   Patient drinks 2 cups daily.     Review of Systems: General: negative for chills, fever, night sweats or weight changes.  Cardiovascular: negative for chest pain, dyspnea on exertion, edema, orthopnea, palpitations, paroxysmal nocturnal dyspnea or shortness of breath Dermatological: negative for rash Respiratory: negative for cough or wheezing Urologic: negative for hematuria Abdominal: negative for nausea, vomiting, diarrhea, bright red blood per rectum, melena, or hematemesis Neurologic: negative for visual changes, syncope, or dizziness All other systems reviewed and are otherwise negative except as noted above.    Blood pressure 128/76,  pulse 77, height '5\' 4"'$  (1.626 m), weight 212 lb 3.2 oz (96.3 kg), last menstrual period 09/27/2011.  General appearance: alert and no distress Neck: no adenopathy, no JVD, supple, symmetrical, trachea midline, thyroid not enlarged, symmetric, no tenderness/mass/nodules and Soft bilateral carotid bruits Lungs: clear to auscultation bilaterally Heart: regular rate and rhythm, S1, S2 normal, no murmur, click, rub or gallop Extremities: extremities normal, atraumatic, no cyanosis or edema  EKG normal sinus rhythm at 77  with left anterior fascicular block. I personally reviewed this EKG  ASSESSMENT AND PLAN:   HTN (hypertension) History of hypertension with blood pressure measured 120/76. She is on hydrochlorothiazide. Continue current meds at current dosing  HLD (hyperlipidemia) History of hyperlipidemia on statin therapy followed by her PCP  Acute ischemic VBA thalamic stroke ([place]) History of left thalamic stroke 04/24/14. She does have high-grade right ICA stenosis by recent carotid Doppler study performed at [facility] in the 75-80% range. I suspect she will require endarterectomy in the near future.  Tobacco use disorder Ongoing tobacco abuse of 2 packs per day recalcitrant to risk factor modification  Peripheral arterial disease ([place]) The patient was referred to me by Dr. Amalia Hailey for absent pedal pulses. She really denies claudication. She had lower extremity arterial Doppler studies in our office 04/05/16 revealing normal ABIs bilaterally with mildly elevated SFA velocities. At this point, no intervention is required other than risk factor modification. If she needs instrumentation of her feet she would be an acceptable candidate from a vascular standpoint with regards to healing.      Lorretta Harp MD FACP,FACC,FAHA, [facility] 04/26/2016 4:03 PM

## 2016-04-26 NOTE — Assessment & Plan Note (Signed)
The patient was referred to me by Dr. Amalia Hailey for absent pedal pulses. She really denies claudication. She had lower extremity arterial Doppler studies in our office 04/05/16 revealing normal ABIs bilaterally with mildly elevated SFA velocities. At this point, no intervention is required other than risk factor modification. If she needs instrumentation of her feet she would be an acceptable candidate from a vascular standpoint with regards to healing.

## 2016-04-26 NOTE — Assessment & Plan Note (Signed)
History of hyperlipidemia on statin therapy followed by her PCP. 

## 2016-04-26 NOTE — Assessment & Plan Note (Signed)
Ongoing tobacco abuse of 2 packs per day recalcitrant to risk factor modification

## 2016-04-26 NOTE — Patient Instructions (Signed)
Medication Instructions: Your physician recommends that you continue on your current medications as directed. Please refer to the Current Medication list given to you today.   Follow-Up: Your physician recommends that you schedule a follow-up appointment as needed with Dr. Berry.  If you need a refill on your cardiac medications before your next appointment, please call your pharmacy.  

## 2016-04-26 NOTE — Assessment & Plan Note (Signed)
History of hypertension with blood pressure measured 120/76. She is on hydrochlorothiazide. Continue current meds at current dosing

## 2016-04-26 NOTE — Assessment & Plan Note (Signed)
History of left thalamic stroke 04/24/14. She does have high-grade right ICA stenosis by recent carotid Doppler study performed at [facility] in the 75-80% range. I suspect she will require endarterectomy in the near future.

## 2016-04-29 ENCOUNTER — Other Ambulatory Visit: Payer: Self-pay | Admitting: *Deleted

## 2016-04-29 DIAGNOSIS — I6523 Occlusion and stenosis of bilateral carotid arteries: Secondary | ICD-10-CM

## 2016-06-29 DIAGNOSIS — H43813 Vitreous degeneration, bilateral: Secondary | ICD-10-CM | POA: Diagnosis not present

## 2016-06-29 DIAGNOSIS — H3552 Pigmentary retinal dystrophy: Secondary | ICD-10-CM | POA: Diagnosis not present

## 2016-06-29 DIAGNOSIS — H35353 Cystoid macular degeneration, bilateral: Secondary | ICD-10-CM | POA: Diagnosis not present

## 2016-07-05 ENCOUNTER — Encounter: Payer: Self-pay | Admitting: Family

## 2016-07-12 ENCOUNTER — Ambulatory Visit (HOSPITAL_COMMUNITY)
Admission: RE | Admit: 2016-07-12 | Discharge: 2016-07-12 | Disposition: A | Discharge status: Home / Self Care | Payer: Federal, State, Local not specified - PPO | Source: Ambulatory Visit | Attending: Family | Admitting: Family

## 2016-07-12 ENCOUNTER — Encounter: Payer: Self-pay | Admitting: Family

## 2016-07-12 ENCOUNTER — Ambulatory Visit (INDEPENDENT_AMBULATORY_CARE_PROVIDER_SITE_OTHER): Payer: Federal, State, Local not specified - PPO | Admitting: Family

## 2016-07-12 VITALS — BP 115/72 | HR 79 | Temp 98.4°F | Resp 20 | Ht 64.0 in | Wt 211.0 lb

## 2016-07-12 DIAGNOSIS — I6523 Occlusion and stenosis of bilateral carotid arteries: Secondary | ICD-10-CM | POA: Diagnosis not present

## 2016-07-12 DIAGNOSIS — F17200 Nicotine dependence, unspecified, uncomplicated: Secondary | ICD-10-CM | POA: Diagnosis not present

## 2016-07-12 DIAGNOSIS — Z8673 Personal history of transient ischemic attack (TIA), and cerebral infarction without residual deficits: Secondary | ICD-10-CM | POA: Diagnosis not present

## 2016-07-12 NOTE — Patient Instructions (Signed)
Stroke Prevention Some medical conditions and behaviors are associated with an increased chance of having a stroke. You may prevent a stroke by making healthy choices and managing medical conditions. How can I reduce my risk of having a stroke?  Stay physically active. Get at least 30 minutes of activity on most or all days.  Do not smoke. It may also be helpful to avoid exposure to secondhand smoke.  Limit alcohol use. Moderate alcohol use is considered to be:  No more than 2 drinks per day for men.  No more than 1 drink per day for nonpregnant women.  Eat healthy foods. This involves:  Eating 5 or more servings of fruits and vegetables a day.  Making dietary changes that address high blood pressure (hypertension), high cholesterol, diabetes, or obesity.  Manage your cholesterol levels.  Making food choices that are high in fiber and low in saturated fat, trans fat, and cholesterol may control cholesterol levels.  Take any prescribed medicines to control cholesterol as directed by your health care provider.  Manage your diabetes.  Controlling your carbohydrate and sugar intake is recommended to manage diabetes.  Take any prescribed medicines to control diabetes as directed by your health care provider.  Control your hypertension.  Making food choices that are low in salt (sodium), saturated fat, trans fat, and cholesterol is recommended to manage hypertension.  Ask your health care provider if you need treatment to lower your blood pressure. Take any prescribed medicines to control hypertension as directed by your health care provider.  If you are 18-39 years of age, have your blood pressure checked every 3-5 years. If you are 40 years of age or older, have your blood pressure checked every year.  Maintain a healthy weight.  Reducing calorie intake and making food choices that are low in sodium, saturated fat, trans fat, and cholesterol are recommended to manage  weight.  Stop drug abuse.  Avoid taking birth control pills.  Talk to your health care provider about the risks of taking birth control pills if you are over 35 years old, smoke, get migraines, or have ever had a blood clot.  Get evaluated for sleep disorders (sleep apnea).  Talk to your health care provider about getting a sleep evaluation if you snore a lot or have excessive sleepiness.  Take medicines only as directed by your health care provider.  For some people, aspirin or blood thinners (anticoagulants) are helpful in reducing the risk of forming abnormal blood clots that can lead to stroke. If you have the irregular heart rhythm of atrial fibrillation, you should be on a blood thinner unless there is a good reason you cannot take them.  Understand all your medicine instructions.  Make sure that other conditions (such as anemia or atherosclerosis) are addressed. Get help right away if:  You have sudden weakness or numbness of the face, arm, or leg, especially on one side of the body.  Your face or eyelid droops to one side.  You have sudden confusion.  You have trouble speaking (aphasia) or understanding.  You have sudden trouble seeing in one or both eyes.  You have sudden trouble walking.  You have dizziness.  You have a loss of balance or coordination.  You have a sudden, severe headache with no known cause.  You have new chest pain or an irregular heartbeat. Any of these symptoms may represent a serious problem that is an emergency. Do not wait to see if the symptoms will go away.   Get medical help at once. Call your local emergency services (911 in [place] Do not drive yourself to the hospital.  This information is not intended to replace advice given to you by your health care provider. Make sure you discuss any questions you have with your health care provider. Document Released: 07/14/2004 Document Revised: 11/12/2015 Document Reviewed: 12/07/2012 Elsevier  Interactive Patient Education  2017 Elsevier Inc.     Steps to Quit Smoking Smoking tobacco can be bad for your health. It can also affect almost every organ in your body. Smoking puts you and people around you at risk for many serious long-lasting (chronic) diseases. Quitting smoking is hard, but it is one of the best things that you can do for your health. It is never too late to quit. What are the benefits of quitting smoking? When you quit smoking, you lower your risk for getting serious diseases and conditions. They can include:  Lung cancer or lung disease.  Heart disease.  Stroke.  Heart attack.  Not being able to have children (infertility).  Weak bones (osteoporosis) and broken bones (fractures). If you have coughing, wheezing, and shortness of breath, those symptoms may get better when you quit. You may also get sick less often. If you are pregnant, quitting smoking can help to lower your chances of having a baby of low birth weight. What can I do to help me quit smoking? Talk with your doctor about what can help you quit smoking. Some things you can do (strategies) include:  Quitting smoking totally, instead of slowly cutting back how much you smoke over a period of time.  Going to in-person counseling. You are more likely to quit if you go to many counseling sessions.  Using resources and support systems, such as:  Online chats with a counselor.  Phone quitlines.  Printed self-help materials.  Support groups or group counseling.  Text messaging programs.  Mobile phone apps or applications.  Taking medicines. Some of these medicines may have nicotine in them. If you are pregnant or breastfeeding, do not take any medicines to quit smoking unless your doctor says it is okay. Talk with your doctor about counseling or other things that can help you. Talk with your doctor about using more than one strategy at the same time, such as taking medicines while you are  also going to in-person counseling. This can help make quitting easier. What things can I do to make it easier to quit? Quitting smoking might feel very hard at first, but there is a lot that you can do to make it easier. Take these steps:  Talk to your family and friends. Ask them to support and encourage you.  Call phone quitlines, reach out to support groups, or work with a counselor.  Ask people who smoke to not smoke around you.  Avoid places that make you want (trigger) to smoke, such as:  Bars.  Parties.  Smoke-break areas at work.  Spend time with people who do not smoke.  Lower the stress in your life. Stress can make you want to smoke. Try these things to help your stress:  Getting regular exercise.  Deep-breathing exercises.  Yoga.  Meditating.  Doing a body scan. To do this, close your eyes, focus on one area of your body at a time from head to toe, and notice which parts of your body are tense. Try to relax the muscles in those areas.  Download or buy apps on your mobile phone or tablet   that can help you stick to your quit plan. There are many free apps, such as QuitGuide from the CDC (Centers for Disease Control and Prevention). You can find more support from smokefree.gov and other websites. This information is not intended to replace advice given to you by your health care provider. Make sure you discuss any questions you have with your health care provider. Document Released: 04/02/2009 Document Revised: 02/02/2016 Document Reviewed: 10/21/2014 Elsevier Interactive Patient Education  2017 Elsevier Inc.  

## 2016-07-12 NOTE — Progress Notes (Signed)
Chief Complaint: Follow up Extracranial Carotid Artery Stenosis   History of Present Illness  Susan Farley is a 56 y.o. female patient of Dr. Donnetta Hutching who returns today for routine extracranial carotid artery surveillance.  She was admitted to [facility] on on 11/ 5/15 with a diagnosis of left thalamic infarct. She presented with numbness on her right face, arm, and leg. Evaluation included carotid duplex suggesting moderate left carotid stenosis. She had a CT angiogram of her neck for further evaluation of this for review as well. She has ongoing intermittent right neck and shoulder soreness, has no other residual symptoms.  She is a long-term cigarette smoker and is trying to cut back. No history of cardiac difficulty. She denies residual right hemiparesis. She denies any subsequent stroke or TIA.  The patient denies a history of amaurosis fugax or monocular blindness, denies a history of unilateral facial drooping, and denies a history of receptive or expressive aphasia.   She denies claudication sx's with walking, denies non healing wounds.  Her chief complaint is ongoing right shoulder pain since her stroke in November 2015; gabapentin has not helped this. She states she knows she has a heart murmur. 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   Pt Diabetic: yes, pt states her last A1C was 6.0,  diagnosed when she had her stroke in November 2015,states her DM is managed by [facility]   Pt smoker: smoker (1.5 ppd, started smoking at age 50 yrs)  Pt meds include: Statin : yes ASA: yes,  325 mg daily Other anticoagulants/antiplatelets: no    Past Medical History:  Diagnosis Date  . Colitis   . Hypercholesteremia   . Hypertension   . Retinitis pigmentosa   . Stroke [facility]) Nov. 2015    Social History Social History  Substance Use Topics  . Smoking status: Current Every Day Smoker    Packs/day: 1.00    Types: Cigarettes,  E-cigarettes  . Smokeless tobacco: Never Used     Comment: 1 1/2 pk per day plus vaporized cigarette.   . Alcohol use No    Family History Family History  Problem Relation Age of Onset  . Adopted: Yes  . Diabetes      BIOLOGICAL AUNT  . Heart disease      BIOLOGICAL AUNT    Surgical History Past Surgical History:  Procedure Laterality Date  . BONE GRAFT HIP ILIAC CREST    . CATARACT EXTRACTION    . IUD REMOVAL    . OOPHORECTOMY      Allergies  Allergen Reactions  . Shellfish Allergy Anaphylaxis    Current Outpatient Prescriptions  Medication Sig Dispense Refill  . aspirin EC 325 MG EC tablet Take 1 tablet (325 mg total) by mouth daily. 30 tablet 0  . docusate sodium (COLACE) 100 MG capsule Take 100 mg by mouth at bedtime as needed for mild constipation.    . dorzolamide (TRUSOPT) 2 % ophthalmic solution Place 1 drop into both eyes 2 (two) times daily.    . hydrochlorothiazide (HYDRODIURIL) 25 MG tablet Take 25 mg by mouth daily.    . pantoprazole (PROTONIX) 40 MG tablet Take 40 mg by mouth daily.     . QUEtiapine (SEROQUEL) 50 MG tablet Take 25 mg by mouth at bedtime.    . simvastatin (ZOCOR) 40 MG tablet Take 40 mg by mouth daily.    . timolol (BETIMOL) 0.5 % ophthalmic solution Place 1 drop into both eyes 2 (  two) times daily.    Marland Kitchen venlafaxine XR (EFFEXOR-XR) 150 MG 24 hr capsule Take 150 mg by mouth daily with breakfast. Reported on 07/01/2015    . Vitamin D, Ergocalciferol, (DRISDOL) 50000 UNITS CAPS capsule Take 50,000 Units by mouth every 7 (seven) days.     No current facility-administered medications for this visit.     Review of Systems : See HPI for pertinent positives and negatives.  Physical Examination  Vitals:   07/12/16 1244 07/12/16 1247  BP: 111/72 115/72  Pulse: 79   Resp: 20   Temp: 98.4 F (36.9 C)   TempSrc: Oral   SpO2: 98%   Weight: 211 lb (95.7 kg)   Height: '5\' 4"'$  (1.626 m)    Body mass index is 36.22 kg/m.  General: WDWN obese  female in NAD GAIT: normal Eyes: PERRLA Pulmonary: Respirations are non-labored, CTAB, no rales, rhonchi, or wheezing.  Cardiac: regular rhythm, + murmur.  VASCULAR EXAM Carotid Bruits Right Left   Negative Negative   Aorta is not palpable. Radial pulses are 2+ palpable and equal.      LE Pulses Right Left   POPLITEAL not palpable  not palpable   POSTERIOR TIBIAL  palpable   palpable    DORSALIS PEDIS  ANTERIOR TIBIAL palpable  palpable     Gastrointestinal: soft, nontender, BS WNL, no r/g, no palpable masses.  Musculoskeletal: No muscle atrophy/wasting. M/S 5/5 throughout, extremities without ischemic changes.  Neurologic: A&O X 3; Appropriate Affect, Speech is normal CN 2-12 intact, pain and light touch intact in extremities, Motor exam as listed above.    Assessment: Susan Farley is a 56 y.o. 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 The hemiparesis has resolved, but what does remain is intermittent right side neck and right shoulder pain that improves in the warmer weather and with topical NSAID.  She has not had any subsequent stroke or TIA.  Her atherosclerotic risk factors include controlled DM, 1.5 ppd smoking since age 84, and obesity.    DATA Today's carotid duplex suggests less than 40% right ICA stenosis and left internal carotid artery stenosis present in the high end 60%-79% range. Bilateral vertebral artery flow is antegrade.  Bilateral subclavian artery waveforms are normal.  Essentially unchanged since previous studies on 04/25/2014, 06/26/15, and 12-31-15.   Plan:  The patient was counseled re smoking cessation and given several free resources re smoking cessation.    Follow-up in 6 months with Carotid Duplex scan.   I discussed in  depth with the patient the nature of atherosclerosis, and emphasized the importance of maximal medical management including strict control of blood pressure, blood glucose, and lipid levels, obtaining regular exercise, and cessation of smoking.  The patient is aware that without maximal medical management the underlying atherosclerotic disease process will progress, limiting the benefit of any interventions. The patient was given information about stroke prevention and what symptoms should prompt the patient to seek immediate medical care. Thank you for allowing [place] to participate in this patient's care.  Clemon Chambers, RN, MSN, FNP-C [facility] Office: [phone]  Clinic Physician: Early  07/12/16 12:55 PM

## 2016-07-13 DIAGNOSIS — H35353 Cystoid macular degeneration, bilateral: Secondary | ICD-10-CM | POA: Diagnosis not present

## 2016-07-18 NOTE — Addendum Note (Signed)
Addended by: Lianne Cure A on: 07/18/2016 11:17 AM   Modules accepted: Orders

## 2016-07-26 ENCOUNTER — Ambulatory Visit (INDEPENDENT_AMBULATORY_CARE_PROVIDER_SITE_OTHER): Payer: Federal, State, Local not specified - PPO | Admitting: Podiatry

## 2016-07-26 ENCOUNTER — Encounter: Payer: Self-pay | Admitting: Podiatry

## 2016-07-26 VITALS — BP 134/82 | HR 75 | Resp 16

## 2016-07-26 DIAGNOSIS — Q828 Other specified congenital malformations of skin: Secondary | ICD-10-CM | POA: Diagnosis not present

## 2016-07-26 DIAGNOSIS — E1151 Type 2 diabetes mellitus with diabetic peripheral angiopathy without gangrene: Secondary | ICD-10-CM

## 2016-07-26 NOTE — Progress Notes (Signed)
Patient ID: Susan Farley, female   DOB: Apr 18, 1961, 56 y.o.   MRN: [id number]   Subjective: This patient presents today complaining of uncomfortable nucleated plantar callus sub-fifth left MPJ. This lesion was last debrided on the visit of 03/22/2016 Patient had lower extremity arterial Doppler on 04/07/2016 with bilateral great toe pressures abnormal. Dr. Gwenlyn Found evaluated patient on the visit of 04/26/2016 for this abnormal Doppler. In his note he stated that no intervention is required other than risk factor modification. If she needs instrumentation her feet she would be acceptable candidate from a vascular standpoint to healing.  Objective:    Orientated 3  Vascular: No calf edema calf tenderness bilaterally DP right 0/4 and DP left 1/4 PT right 2/4 PT left 0/4 Capillary reflex delayed bilaterally  Neurological: Sensation to 10 g monofilament 5/5 bilaterally Vibratory sensation reactive bilaterally Ankle reflex equal and reactive bilaterally  Dermatological: No open skin lesions bilaterally Right hallux nail margins are narrow from previous surgery with slight disc colorization of the distal right hallux. There is no tenderness to direct pressure on the right hallux The left hallux nail margins have been removed surgically with residual incurvation of the medial lateral borders with slight tenderness to direct palpation. There is no surrounding erythema, edema, warmth or drainage Nucleated plantar callus sub-fifth left MPJ  Musculoskeletal: No deformities noted bilaterally Manual motor testing: Dorsi flexion, plantar flexion, inversion, eversion 5/5 bilaterally   Assessment: Diabetic with peripheral arterial disease Porokeratosis 1  Plan: I reviewed the results of the arterial Doppler and Dr. Kennon Holter note concerning the arterial Doppler with patient today. The porokeratosis 1 was debrided without any bleeding  Reappoint when necessary for debridement or yearly

## 2016-07-26 NOTE — Patient Instructions (Addendum)
Return as needed for trimming of the callus on the bottom of the left foot and yearly for foot examinations  Diabetes and Foot Care Diabetes may cause you to have problems because of poor blood supply (circulation) to your feet and legs. This may cause the skin on your feet to become thinner, break easier, and heal more slowly. Your skin may become dry, and the skin may peel and crack. You may also have nerve damage in your legs and feet causing decreased feeling in them. You may not notice minor injuries to your feet that could lead to infections or more serious problems. Taking care of your feet is one of the most important things you can do for yourself. Follow these instructions at home:  Wear shoes at all times, even in the house. Do not go barefoot. Bare feet are easily injured.  Check your feet daily for blisters, cuts, and redness. If you cannot see the bottom of your feet, use a mirror or ask someone for help.  Wash your feet with warm water (do not use hot water) and mild soap. Then pat your feet and the areas between your toes until they are completely dry. Do not soak your feet as this can dry your skin.  Apply a moisturizing lotion or petroleum jelly (that does not contain alcohol and is unscented) to the skin on your feet and to dry, brittle toenails. Do not apply lotion between your toes.  Trim your toenails straight across. Do not dig under them or around the cuticle. File the edges of your nails with an emery board or nail file.  Do not cut corns or calluses or try to remove them with medicine.  Wear clean socks or stockings every day. Make sure they are not too tight. Do not wear knee-high stockings since they may decrease blood flow to your legs.  Wear shoes that fit properly and have enough cushioning. To break in new shoes, wear them for just a few hours a day. This prevents you from injuring your feet. Always look in your shoes before you put them on to be sure there are no  objects inside.  Do not cross your legs. This may decrease the blood flow to your feet.  If you find a minor scrape, cut, or break in the skin on your feet, keep it and the skin around it clean and dry. These areas may be cleansed with mild soap and water. Do not cleanse the area with peroxide, alcohol, or iodine.  When you remove an adhesive bandage, be sure not to damage the skin around it.  If you have a wound, look at it several times a day to make sure it is healing.  Do not use heating pads or hot water bottles. They may burn your skin. If you have lost feeling in your feet or legs, you may not know it is happening until it is too late.  Make sure your health care provider performs a complete foot exam at least annually or more often if you have foot problems. Report any cuts, sores, or bruises to your health care provider immediately. Contact a health care provider if:  You have an injury that is not healing.  You have cuts or breaks in the skin.  You have an ingrown nail.  You notice redness on your legs or feet.  You feel burning or tingling in your legs or feet.  You have pain or cramps in your legs and feet.  Your legs or feet are numb.  Your feet always feel cold. Get help right away if:  There is increasing redness, swelling, or pain in or around a wound.  There is a red line that goes up your leg.  Pus is coming from a wound.  You develop a fever or as directed by your health care provider.  You notice a bad smell coming from an ulcer or wound. This information is not intended to replace advice given to you by your health care provider. Make sure you discuss any questions you have with your health care provider. Document Released: 06/03/2000 Document Revised: 11/12/2015 Document Reviewed: 11/13/2012 Elsevier Interactive Patient Education  2017 [employer].

## 2016-08-03 DIAGNOSIS — H43813 Vitreous degeneration, bilateral: Secondary | ICD-10-CM | POA: Diagnosis not present

## 2016-08-03 DIAGNOSIS — H35353 Cystoid macular degeneration, bilateral: Secondary | ICD-10-CM | POA: Diagnosis not present

## 2016-08-03 DIAGNOSIS — E113313 Type 2 diabetes mellitus with moderate nonproliferative diabetic retinopathy with macular edema, bilateral: Secondary | ICD-10-CM | POA: Diagnosis not present

## 2016-08-03 DIAGNOSIS — H3552 Pigmentary retinal dystrophy: Secondary | ICD-10-CM | POA: Diagnosis not present

## 2016-08-26 DIAGNOSIS — H18411 Arcus senilis, right eye: Secondary | ICD-10-CM | POA: Diagnosis not present

## 2016-08-26 DIAGNOSIS — E119 Type 2 diabetes mellitus without complications: Secondary | ICD-10-CM | POA: Diagnosis not present

## 2016-08-26 DIAGNOSIS — H3552 Pigmentary retinal dystrophy: Secondary | ICD-10-CM | POA: Diagnosis not present

## 2016-08-26 DIAGNOSIS — H401131 Primary open-angle glaucoma, bilateral, mild stage: Secondary | ICD-10-CM | POA: Diagnosis not present

## 2016-08-26 DIAGNOSIS — I10 Essential (primary) hypertension: Secondary | ICD-10-CM | POA: Diagnosis not present

## 2016-09-02 DIAGNOSIS — E113393 Type 2 diabetes mellitus with moderate nonproliferative diabetic retinopathy without macular edema, bilateral: Secondary | ICD-10-CM | POA: Diagnosis not present

## 2016-09-02 DIAGNOSIS — H3552 Pigmentary retinal dystrophy: Secondary | ICD-10-CM | POA: Diagnosis not present

## 2016-09-02 DIAGNOSIS — H35372 Puckering of macula, left eye: Secondary | ICD-10-CM | POA: Diagnosis not present

## 2016-09-02 DIAGNOSIS — H43813 Vitreous degeneration, bilateral: Secondary | ICD-10-CM | POA: Diagnosis not present

## 2016-09-06 DIAGNOSIS — Z720 Tobacco use: Secondary | ICD-10-CM | POA: Diagnosis not present

## 2016-09-06 DIAGNOSIS — E785 Hyperlipidemia, unspecified: Secondary | ICD-10-CM | POA: Diagnosis not present

## 2016-09-06 DIAGNOSIS — I10 Essential (primary) hypertension: Secondary | ICD-10-CM | POA: Diagnosis not present

## 2016-09-06 DIAGNOSIS — Z5181 Encounter for therapeutic drug level monitoring: Secondary | ICD-10-CM | POA: Diagnosis not present

## 2016-09-06 DIAGNOSIS — E119 Type 2 diabetes mellitus without complications: Secondary | ICD-10-CM | POA: Diagnosis not present

## 2016-09-06 DIAGNOSIS — Z7984 Long term (current) use of oral hypoglycemic drugs: Secondary | ICD-10-CM | POA: Diagnosis not present

## 2016-09-29 DIAGNOSIS — K219 Gastro-esophageal reflux disease without esophagitis: Secondary | ICD-10-CM | POA: Diagnosis not present

## 2016-09-29 DIAGNOSIS — Z1231 Encounter for screening mammogram for malignant neoplasm of breast: Secondary | ICD-10-CM | POA: Diagnosis not present

## 2016-09-29 DIAGNOSIS — L739 Follicular disorder, unspecified: Secondary | ICD-10-CM | POA: Diagnosis not present

## 2016-09-29 DIAGNOSIS — E119 Type 2 diabetes mellitus without complications: Secondary | ICD-10-CM | POA: Diagnosis not present

## 2016-11-09 DIAGNOSIS — H43813 Vitreous degeneration, bilateral: Secondary | ICD-10-CM | POA: Diagnosis not present

## 2016-11-09 DIAGNOSIS — E113393 Type 2 diabetes mellitus with moderate nonproliferative diabetic retinopathy without macular edema, bilateral: Secondary | ICD-10-CM | POA: Diagnosis not present

## 2016-11-09 DIAGNOSIS — H35372 Puckering of macula, left eye: Secondary | ICD-10-CM | POA: Diagnosis not present

## 2016-11-09 DIAGNOSIS — H3552 Pigmentary retinal dystrophy: Secondary | ICD-10-CM | POA: Diagnosis not present

## 2016-11-22 ENCOUNTER — Ambulatory Visit (HOSPITAL_COMMUNITY): Admission: RE | Admit: 2016-11-22 | Payer: Federal, State, Local not specified - PPO | Source: Ambulatory Visit

## 2016-11-22 ENCOUNTER — Ambulatory Visit: Payer: Federal, State, Local not specified - PPO | Admitting: Family

## 2016-11-28 ENCOUNTER — Encounter: Payer: Self-pay | Admitting: Family

## 2016-12-01 DIAGNOSIS — Z0000 Encounter for general adult medical examination without abnormal findings: Secondary | ICD-10-CM | POA: Diagnosis not present

## 2016-12-01 DIAGNOSIS — Z13220 Encounter for screening for lipoid disorders: Secondary | ICD-10-CM | POA: Diagnosis not present

## 2016-12-06 DIAGNOSIS — H6122 Impacted cerumen, left ear: Secondary | ICD-10-CM | POA: Diagnosis not present

## 2016-12-06 DIAGNOSIS — F17210 Nicotine dependence, cigarettes, uncomplicated: Secondary | ICD-10-CM | POA: Diagnosis not present

## 2016-12-06 DIAGNOSIS — K219 Gastro-esophageal reflux disease without esophagitis: Secondary | ICD-10-CM | POA: Diagnosis not present

## 2016-12-06 DIAGNOSIS — E119 Type 2 diabetes mellitus without complications: Secondary | ICD-10-CM | POA: Diagnosis not present

## 2016-12-06 DIAGNOSIS — I10 Essential (primary) hypertension: Secondary | ICD-10-CM | POA: Diagnosis not present

## 2016-12-06 DIAGNOSIS — E785 Hyperlipidemia, unspecified: Secondary | ICD-10-CM | POA: Diagnosis not present

## 2016-12-07 ENCOUNTER — Encounter: Payer: Self-pay | Admitting: Family

## 2016-12-07 ENCOUNTER — Ambulatory Visit (INDEPENDENT_AMBULATORY_CARE_PROVIDER_SITE_OTHER): Payer: Federal, State, Local not specified - PPO | Admitting: Family

## 2016-12-07 ENCOUNTER — Ambulatory Visit (HOSPITAL_COMMUNITY)
Admission: RE | Admit: 2016-12-07 | Discharge: 2016-12-07 | Disposition: A | Discharge status: Home / Self Care | Payer: Federal, State, Local not specified - PPO | Source: Ambulatory Visit | Attending: Family | Admitting: Family

## 2016-12-07 VITALS — BP 134/78 | HR 96 | Temp 99.1°F | Resp 20 | Ht 64.0 in | Wt 214.0 lb

## 2016-12-07 DIAGNOSIS — F17200 Nicotine dependence, unspecified, uncomplicated: Secondary | ICD-10-CM

## 2016-12-07 DIAGNOSIS — Z8673 Personal history of transient ischemic attack (TIA), and cerebral infarction without residual deficits: Secondary | ICD-10-CM | POA: Diagnosis not present

## 2016-12-07 DIAGNOSIS — I6523 Occlusion and stenosis of bilateral carotid arteries: Secondary | ICD-10-CM | POA: Insufficient documentation

## 2016-12-07 DIAGNOSIS — R200 Anesthesia of skin: Secondary | ICD-10-CM | POA: Diagnosis not present

## 2016-12-07 DIAGNOSIS — R202 Paresthesia of skin: Secondary | ICD-10-CM

## 2016-12-07 LAB — VAS US CAROTID
LCCADDIAS: -26 cm/s
LCCADSYS: -92 cm/s
LCCAPDIAS: 22 cm/s
LEFT ECA DIAS: -43 cm/s
LEFT VERTEBRAL DIAS: 18 cm/s
LICADDIAS: -46 cm/s
LICADSYS: -121 cm/s
LICAPSYS: -250 cm/s
Left CCA prox sys: 126 cm/s
Left ICA prox dias: -91 cm/s
RCCAPSYS: 91 cm/s
RIGHT CCA MID DIAS: 21 cm/s
RIGHT ECA DIAS: -16 cm/s
RIGHT VERTEBRAL DIAS: -17 cm/s
Right CCA prox dias: 16 cm/s
Right cca dist sys: -96 cm/s

## 2016-12-07 NOTE — Progress Notes (Signed)
Chief Complaint: Follow up Extracranial Carotid Artery Stenosis   History of Present Illness  Susan Farley is a 56 y.o. female patient of Dr. Donnetta Hutching who returns today for routine extracranial carotid artery surveillance.   She was admitted to [facility] on on 11/ 5/15 with a diagnosis of left thalamic infarct. She presented with numbness on her right face, arm, and leg. Evaluation included carotid duplex suggesting moderate left carotid stenosis. She had a CT angiogram of her neck for further evaluation of this for review as well. She has ongoing intermittent right neck and shoulder soreness, has no other residual symptoms.   She is a long-term cigarette smoker and is trying to cut back. No history of cardiac difficulty.  The patient denies a history of amaurosis fugax or monocular blindness, denies a history of unilateral facial drooping, and denies a history of receptive or expressive aphasia.   She denies claudication sx's with walking, denies non healing wounds.  Her chief complaint today is tingling and numbness in her right hand and arm that started about 2 weeks ago; she denies sx's of amaurosis, denies speech difficulties, denies right leg sx's that left leg does not have, she reports bilateral anterior lower leg numbness for the last couple of months.   She states she knows she has a heart murmur. 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   Pt Diabetic: yes, pt states her last A1C was 6.0,  diagnosed when she had her stroke in November 2015,states her DM is managed by [facility]  Pt smoker: smoker (1.5 ppd, started smoking at age 84 yrs); pt states she will be starting Chantix  Pt meds include: Statin : yes ASA: yes, 325 mg daily Other anticoagulants/antiplatelets: no     Past Medical History:  Diagnosis Date  . Colitis   . Hypercholesteremia   . Hypertension   . Retinitis pigmentosa   . Stroke [facility]) Nov. 2015     Social History Social History  Substance Use Topics  . Smoking status: Current Every Day Smoker    Packs/day: 1.00    Types: Cigarettes, E-cigarettes  . Smokeless tobacco: Never Used     Comment: 1 1/2 pk per day plus vaporized cigarette.   . Alcohol use No    Family History Family History  Problem Relation Age of Onset  . Adopted: Yes  . Diabetes Unknown        BIOLOGICAL AUNT  . Heart disease Unknown        BIOLOGICAL AUNT    Surgical History Past Surgical History:  Procedure Laterality Date  . BONE GRAFT HIP ILIAC CREST    . CATARACT EXTRACTION    . IUD REMOVAL    . OOPHORECTOMY      Allergies  Allergen Reactions  . Shellfish Allergy Anaphylaxis    Current Outpatient Prescriptions  Medication Sig Dispense Refill  . aspirin EC 325 MG EC tablet Take 1 tablet (325 mg total) by mouth daily. 30 tablet 0  . docusate sodium (COLACE) 100 MG capsule Take 100 mg by mouth at bedtime as needed for mild constipation.    . dorzolamide (TRUSOPT) 2 % ophthalmic solution Place 1 drop into both eyes 2 (two) times daily.    . hydrochlorothiazide (HYDRODIURIL) 25 MG tablet Take 25 mg by mouth daily.    . pantoprazole (PROTONIX) 40 MG tablet Take 40 mg by mouth daily.     . QUEtiapine (SEROQUEL) 50 MG tablet Take 25 mg  by mouth at bedtime.    . simvastatin (ZOCOR) 40 MG tablet Take 40 mg by mouth daily.    . timolol (BETIMOL) 0.5 % ophthalmic solution Place 1 drop into both eyes 2 (two) times daily.    Marland Kitchen venlafaxine XR (EFFEXOR-XR) 150 MG 24 hr capsule Take 150 mg by mouth daily with breakfast. Reported on 07/01/2015    . Vitamin D, Ergocalciferol, (DRISDOL) 50000 UNITS CAPS capsule Take 50,000 Units by mouth every 7 (seven) days.     No current facility-administered medications for this visit.     Review of Systems : See HPI for pertinent positives and negatives.  Physical Examination  Vitals:   12/07/16 1530 12/07/16 1533  BP: 122/73 134/78  Pulse: 96   Resp: 20    Temp: 99.1 F (37.3 C)   TempSrc: Oral   SpO2: 96%   Weight: 214 lb (97.1 kg)   Height: 5\' 4"  (1.626 m)    Body mass index is 36.73 kg/m.  General: WDWN obese female in NAD GAIT:normal Eyes: PERRLA Pulmonary: Respirations are non-labored, distant breath sounds, CTAB, no rales,rhonchi, or wheezing.  Cardiac: regular rhythm, + murmur.  VASCULAR EXAM Carotid Bruits Right Left   Negative Negative    Abdominal aortic pulse is not palpable. Radial pulses are 2+ palpable and equal.      LE Pulses Right Left   POPLITEAL not palpable  not palpable   POSTERIOR TIBIAL  palpable   palpable    DORSALIS PEDIS  ANTERIOR TIBIAL palpable  palpable     Gastrointestinal: softly obese, nontender, BS WNL, no r/g,no palpable masses.  Musculoskeletal: No muscle atrophy/wasting. M/S 5/5 throughout, extremities without ischemic changes.  Neurologic: A&O X 3; appropriate affect, speech is normal, CN 2-12 intact, pain and light touch intact in extremities, Motor exam as listed above. No pain elicited in either lower or upper extremity when lifting against resistance.     Assessment: Susan Farley is a 56 y.o. 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 The hemiparesis had resolved, what does remain is intermittent right side neck and right shoulder pain that improves in the warmer weather and with topical NSAID.  She has a 2 weeks history of right upper extremity tingling and numbness, no symptoms in her right leg other than the same numbness at the lower anterior aspect of both legs that has been present for the last couple of months; no amaurosis fugax, no slurred speech nor aphasia, no headache nor vertigo.   Her atherosclerotic risk factors include controlled DM, 1.5 ppd smoking since  age 60, and obesity.    DATA Today's carotid duplex suggests less than 40% right ICA stenosis and left internal carotid artery stenosis present in the high end 60%-79% range. Bilateral vertebral artery flow is antegrade.  Bilateral subclavian artery waveforms are normal.  Essentially unchanged since previous studies on 04/25/2014, 06/26/15, 12-31-15, and 07-12-16.   Plan:  The patient was counseled re smoking cessation and given several free resources re smoking cessation.  Based on carotid duplex results, pt HPI, and physical exam results, and after communicating with Dr. Donnetta Hutching via the [place], will scheduled pt for CTA head and neck ASAP, to be done before seeing Dr. Donnetta Hutching on 12-13-16 in the office.     I discussed in depth with the patient the nature of atherosclerosis, and emphasized the importance of maximal medical management including strict control of blood pressure, blood glucose, and lipid levels, obtaining regular exercise,  and cessation of smoking.  The patient is aware that without maximal medical management the underlying atherosclerotic disease process will progress, limiting the benefit of any interventions. The patient was given information about stroke prevention and what symptoms should prompt the patient to seek immediate medical care. Thank you for allowing [place] to participate in this patient's care.  Clemon Chambers, RN, MSN, FNP-C [facility] Office: [phone]  Clinic Physician: Donzetta Matters  12/07/16 6:03 PM

## 2016-12-07 NOTE — Patient Instructions (Signed)
Steps to Quit Smoking Smoking tobacco can be bad for your health. It can also affect almost every organ in your body. Smoking puts you and people around you at risk for many serious long-lasting (chronic) diseases. Quitting smoking is hard, but it is one of the best things that you can do for your health. It is never too late to quit. What are the benefits of quitting smoking? When you quit smoking, you lower your risk for getting serious diseases and conditions. They can include:  Lung cancer or lung disease.  Heart disease.  Stroke.  Heart attack.  Not being able to have children (infertility).  Weak bones (osteoporosis) and broken bones (fractures).  If you have coughing, wheezing, and shortness of breath, those symptoms may get better when you quit. You may also get sick less often. If you are pregnant, quitting smoking can help to lower your chances of having a baby of low birth weight. What can I do to help me quit smoking? Talk with your doctor about what can help you quit smoking. Some things you can do (strategies) include:  Quitting smoking totally, instead of slowly cutting back how much you smoke over a period of time.  Going to in-person counseling. You are more likely to quit if you go to many counseling sessions.  Using resources and support systems, such as: ? Online chats with a counselor. ? Phone quitlines. ? Printed self-help materials. ? Support groups or group counseling. ? Text messaging programs. ? Mobile phone apps or applications.  Taking medicines. Some of these medicines may have nicotine in them. If you are pregnant or breastfeeding, do not take any medicines to quit smoking unless your doctor says it is okay. Talk with your doctor about counseling or other things that can help you.  Talk with your doctor about using more than one strategy at the same time, such as taking medicines while you are also going to in-person counseling. This can help make  quitting easier. What things can I do to make it easier to quit? Quitting smoking might feel very hard at first, but there is a lot that you can do to make it easier. Take these steps:  Talk to your family and friends. Ask them to support and encourage you.  Call phone quitlines, reach out to support groups, or work with a counselor.  Ask people who smoke to not smoke around you.  Avoid places that make you want (trigger) to smoke, such as: ? Bars. ? Parties. ? Smoke-break areas at work.  Spend time with people who do not smoke.  Lower the stress in your life. Stress can make you want to smoke. Try these things to help your stress: ? Getting regular exercise. ? Deep-breathing exercises. ? Yoga. ? Meditating. ? Doing a body scan. To do this, close your eyes, focus on one area of your body at a time from head to toe, and notice which parts of your body are tense. Try to relax the muscles in those areas.  Download or buy apps on your mobile phone or tablet that can help you stick to your quit plan. There are many free apps, such as QuitGuide from the CDC (Centers for Disease Control and Prevention). You can find more support from smokefree.gov and other websites.  This information is not intended to replace advice given to you by your health care provider. Make sure you discuss any questions you have with your health care provider. Document Released: 04/02/2009 Document   Revised: 02/02/2016 Document Reviewed: 10/21/2014 Elsevier Interactive Patient Education  2018 [employer].        Preventing Cerebrovascular Disease Arteries are blood vessels that carry blood that contains oxygen from the heart to all parts of the body. Cerebrovascular disease affects arteries that supply the brain. Any condition that blocks or disrupts blood flow to the brain can cause cerebrovascular disease. Brain cells that lose blood supply start to die within minutes (stroke). Stroke is the main danger of  cerebrovascular disease. Atherosclerosis and high blood pressure are common causes of cerebrovascular disease. Atherosclerosis is narrowing and hardening of an artery that results when fat, cholesterol, calcium, or other substances (plaque) build up inside an artery. Plaque reduces blood flow through the artery. High blood pressure increases the risk of bleeding inside the brain. Making diet and lifestyle changes to prevent atherosclerosis and high blood pressure lowers your risk of cerebrovascular disease. What nutrition changes can be made?  Eat more fruits, vegetables, and whole grains.  Reduce how much saturated fat you eat. To do this, eat less red meat and fewer full-fat dairy products.  Eat healthy proteins instead of red meat. Healthy proteins include: ? Fish. Eat fish that contains heart-healthy omega-3 fatty acids, twice a week. Examples include salmon, albacore tuna, mackerel, and herring. ? Chicken. ? Nuts. ? Low-fat or nonfat yogurt.  Avoid processed meats, like bacon and lunchmeat.  Avoid foods that contain: ? A lot of sugar, such as sweets and drinks with added sugar. ? A lot of salt (sodium). Avoid adding extra salt to your food, as told by your health care provider. ? Trans fats, such as margarine and baked goods. Trans fats may be listed as "partially hydrogenated oils" on food labels.  Check food labels to see how much sodium, sugar, and trans fats are in foods.  Use vegetable oils that contain low amounts of saturated fat, such as olive oil or canola oil. What lifestyle changes can be made?  Drink alcohol in moderation. This means no more than 1 drink a day for nonpregnant women and 2 drinks a day for men. One drink equals 12 oz of beer, 5 oz of wine, or 1 oz of hard liquor.  If you are overweight, ask your health care provider to recommend a weight-loss plan for you. Losing 5-10 lb (2.2-4.5 kg) can reduce your risk of diabetes, atherosclerosis, and high blood  pressure.  Exercise for 30?60 minutes on most days, or as much as told by your health care provider. ? Do moderate-intensity exercise, such as brisk walking, bicycling, and water aerobics. Ask your health care provider which activities are safe for you.  Do not use any products that contain nicotine or tobacco, such as cigarettes and e-cigarettes. If you need help quitting, ask your health care provider. Why are these changes important? Making these changes lowers your risk of many diseases that can cause cerebrovascular disease and stroke. Stroke is a leading cause of death and disability. Making these changes also improves your overall health and quality of life. What can I do to lower my risk? The following factors make you more likely to develop cerebrovascular disease:  Being overweight.  Smoking.  Being physically inactive.  Eating a high-fat diet.  Having certain health conditions, such as: ? Diabetes. ? High blood pressure. ? Heart disease. ? Atherosclerosis. ? High cholesterol. ? Sickle cell disease.  Talk with your health care provider about your risk for cerebrovascular disease. Work with your health care provider to  control diseases that you have that may contribute to cerebrovascular disease. Your health care provider may prescribe medicines to help prevent major causes of cerebrovascular disease. Where to find more information: Learn more about preventing cerebrovascular disease from:  [place], Lung, and [place]: MoAnalyst.de  Centers for Disease Control and Prevention: http://www.curry-wood.biz/  Summary  Cerebrovascular disease can lead to a stroke.  Atherosclerosis and high blood pressure are major causes of cerebrovascular disease.  Making diet and lifestyle changes can reduce your risk of cerebrovascular disease.  Work with your health care provider to get your risk factors under control to reduce your  risk of cerebrovascular disease. This information is not intended to replace advice given to you by your health care provider. Make sure you discuss any questions you have with your health care provider. Document Released: 06/21/2015 Document Revised: 12/25/2015 Document Reviewed: 06/21/2015 Elsevier Interactive Patient Education  2018 [employer]. Stroke Prevention Some medical conditions and behaviors are associated with an increased chance of having a stroke. You may prevent a stroke by making healthy choices and managing medical conditions. How can I reduce my risk of having a stroke?  Stay physically active. Get at least 30 minutes of activity on most or all days.  Do not smoke. It may also be helpful to avoid exposure to secondhand smoke.  Limit alcohol use. Moderate alcohol use is considered to be: ? No more than 2 drinks per day for men. ? No more than 1 drink per day for nonpregnant women.  Eat healthy foods. This involves: ? Eating 5 or more servings of fruits and vegetables a day. ? Making dietary changes that address high blood pressure (hypertension), high cholesterol, diabetes, or obesity.  Manage your cholesterol levels. ? Making food choices that are high in fiber and low in saturated fat, trans fat, and cholesterol may control cholesterol levels. ? Take any prescribed medicines to control cholesterol as directed by your health care provider.  Manage your diabetes. ? Controlling your carbohydrate and sugar intake is recommended to manage diabetes. ? Take any prescribed medicines to control diabetes as directed by your health care provider.  Control your hypertension. ? Making food choices that are low in salt (sodium), saturated fat, trans fat, and cholesterol is recommended to manage hypertension. ? Ask your health care provider if you need treatment to lower your blood pressure. Take any prescribed medicines to control hypertension as directed by your health care  provider. ? If you are 65-42 years of age, have your blood pressure checked every 3-5 years. If you are 39 years of age or older, have your blood pressure checked every year.  Maintain a healthy weight. ? Reducing calorie intake and making food choices that are low in sodium, saturated fat, trans fat, and cholesterol are recommended to manage weight.  Stop drug abuse.  Avoid taking birth control pills. ? Talk to your health care provider about the risks of taking birth control pills if you are over 98 years old, smoke, get migraines, or have ever had a blood clot.  Get evaluated for sleep disorders (sleep apnea). ? Talk to your health care provider about getting a sleep evaluation if you snore a lot or have excessive sleepiness.  Take medicines only as directed by your health care provider. ? For some people, aspirin or blood thinners (anticoagulants) are helpful in reducing the risk of forming abnormal blood clots that can lead to stroke. If you have the irregular heart rhythm of atrial fibrillation, you  should be on a blood thinner unless there is a good reason you cannot take them. ? Understand all your medicine instructions.  Make sure that other conditions (such as anemia or atherosclerosis) are addressed. Get help right away if:  You have sudden weakness or numbness of the face, arm, or leg, especially on one side of the body.  Your face or eyelid droops to one side.  You have sudden confusion.  You have trouble speaking (aphasia) or understanding.  You have sudden trouble seeing in one or both eyes.  You have sudden trouble walking.  You have dizziness.  You have a loss of balance or coordination.  You have a sudden, severe headache with no known cause.  You have new chest pain or an irregular heartbeat. Any of these symptoms may represent a serious problem that is an emergency. Do not wait to see if the symptoms will go away. Get medical help at once. Call your local  emergency services (911 in [place] Do not drive yourself to the hospital. This information is not intended to replace advice given to you by your health care provider. Make sure you discuss any questions you have with your health care provider. Document Released: 07/14/2004 Document Revised: 11/12/2015 Document Reviewed: 12/07/2012 Elsevier Interactive Patient Education  2017 [employer].

## 2016-12-08 ENCOUNTER — Telehealth: Payer: Self-pay | Admitting: Family

## 2016-12-08 NOTE — Telephone Encounter (Signed)
Per Suzanne's instructions, I scheduled a CTA head/neck for 12/12/16 at 2:10pm at [facility]. She is to arrive at 1:50pm. She is to see Dr.Todd Early on 12/13/16 at 9:30am. The patient is aware of the above appts. awt

## 2016-12-12 ENCOUNTER — Ambulatory Visit
Admission: RE | Admit: 2016-12-12 | Discharge: 2016-12-12 | Disposition: A | Discharge status: Home / Self Care | Payer: Federal, State, Local not specified - PPO | Source: Ambulatory Visit | Attending: Family | Admitting: Family

## 2016-12-12 DIAGNOSIS — Z8673 Personal history of transient ischemic attack (TIA), and cerebral infarction without residual deficits: Secondary | ICD-10-CM

## 2016-12-12 DIAGNOSIS — I6523 Occlusion and stenosis of bilateral carotid arteries: Secondary | ICD-10-CM

## 2016-12-12 DIAGNOSIS — R202 Paresthesia of skin: Secondary | ICD-10-CM | POA: Diagnosis not present

## 2016-12-12 MED ORDER — IOPAMIDOL (ISOVUE-370) INJECTION 76%
75.0000 mL | Freq: Once | INTRAVENOUS | Status: AC | PRN
  Administered 2016-12-12: 75 mL via INTRAVENOUS

## 2016-12-13 ENCOUNTER — Encounter: Payer: Self-pay | Admitting: Vascular Surgery

## 2016-12-13 ENCOUNTER — Ambulatory Visit (INDEPENDENT_AMBULATORY_CARE_PROVIDER_SITE_OTHER): Payer: Self-pay | Admitting: Vascular Surgery

## 2016-12-13 VITALS — BP 137/86 | HR 68 | Temp 97.6°F | Resp 20 | Ht 64.0 in | Wt 215.0 lb

## 2016-12-13 DIAGNOSIS — I6523 Occlusion and stenosis of bilateral carotid arteries: Secondary | ICD-10-CM

## 2016-12-13 DIAGNOSIS — R918 Other nonspecific abnormal finding of lung field: Secondary | ICD-10-CM

## 2016-12-13 DIAGNOSIS — R200 Anesthesia of skin: Secondary | ICD-10-CM

## 2016-12-13 DIAGNOSIS — R202 Paresthesia of skin: Secondary | ICD-10-CM | POA: Diagnosis not present

## 2016-12-13 NOTE — Addendum Note (Signed)
Addended by: Lianne Cure A on: 12/13/2016 05:26 PM   Modules accepted: Orders

## 2016-12-13 NOTE — Progress Notes (Signed)
[facility] [place]  Patient name: Susan Farley MRN: [id number] DOB: 12-29-1960 Sex: female  REASON FOR VISIT: Follow-up carotid disease and discuss new left upper lobe lung mass  HPI: Susan Farley is a 56 y.o. female here today for follow-up. She was initially seen by me in 2015 where she had a thalamic stroke. At that time she was found to have an asymptomatic left carotid moderate stenosis. She's been followed with serial exams in our office. She recently had a follow-up showing no change in her duplex but did report some tingling in her right upper extremity. This comes and goes and was initially positional. She reports that she has noted this am since her stroke in 2015. No lower extremity symptoms and no a fascia. She underwent CT scan for further evaluation of her moderate stenosis.  Past Medical History:  Diagnosis Date  . Colitis   . Hypercholesteremia   . Hypertension   . Retinitis pigmentosa   . Stroke [facility]) Nov. 2015    Family History  Problem Relation Age of Onset  . Adopted: Yes  . Diabetes Unknown        BIOLOGICAL AUNT  . Heart disease Unknown        BIOLOGICAL AUNT    SOCIAL HISTORY: Social History  Substance Use Topics  . Smoking status: Current Every Day Smoker    Packs/day: 1.00    Types: Cigarettes, E-cigarettes  . Smokeless tobacco: Never Used     Comment: 1 1/2 pk per day plus vaporized cigarette.   . Alcohol use No    Allergies  Allergen Reactions  . Shellfish Allergy Anaphylaxis    Current Outpatient Prescriptions  Medication Sig Dispense Refill  . aspirin EC 325 MG EC tablet Take 1 tablet (325 mg total) by mouth daily. 30 tablet 0  . docusate sodium (COLACE) 100 MG capsule Take 100 mg by mouth at bedtime as needed for mild constipation.    . dorzolamide (TRUSOPT) 2 % ophthalmic solution Place 1 drop into both eyes 2 (two) times daily.    . hydrochlorothiazide (HYDRODIURIL) 25  MG tablet Take 25 mg by mouth daily.    . pantoprazole (PROTONIX) 40 MG tablet Take 40 mg by mouth daily.     . QUEtiapine (SEROQUEL) 50 MG tablet Take 25 mg by mouth at bedtime.    . simvastatin (ZOCOR) 40 MG tablet Take 40 mg by mouth daily.    . timolol (BETIMOL) 0.5 % ophthalmic solution Place 1 drop into both eyes 2 (two) times daily.    Marland Kitchen venlafaxine XR (EFFEXOR-XR) 150 MG 24 hr capsule Take 150 mg by mouth daily with breakfast. Reported on 07/01/2015    . Vitamin D, Ergocalciferol, (DRISDOL) 50000 UNITS CAPS capsule Take 50,000 Units by mouth every 7 (seven) days.     No current facility-administered medications for this visit.     REVIEW OF SYSTEMS:  [X]  denotes positive finding, [ ]  denotes negative finding Cardiac  Comments:  Chest pain or chest pressure:    Shortness of breath upon exertion: x   Short of breath when lying flat:    Irregular heart rhythm:        Vascular    Pain in calf, thigh, or hip brought on by ambulation:    Pain in feet at night that wakes you up from your sleep:     Blood clot in your veins:    Leg swelling:  PHYSICAL EXAM: Vitals:   12/13/16 0934 12/13/16 0936  BP: 124/79 137/86  Pulse: 68   Resp: 20   Temp: 97.6 F (36.4 C)   TempSrc: Oral   SpO2: 97%   Weight: 215 lb (97.5 kg)   Height: 5\' 4"  (1.626 m)     GENERAL: The patient is a well-nourished female, in no acute distress. The vital signs are documented above. CARDIOVASCULAR: I do not hear carotid bruits bilaterally. PULMONARY: There is good air exchange  MUSCULOSKELETAL: There are no major deformities or cyanosis. NEUROLOGIC: No focal weakness or paresthesias are detected. SKIN: There are no ulcers or rashes noted. PSYCHIATRIC: The patient has a normal affect.  DATA:  CT scan was discussed with the patient. This shows 60% stenosis at her left distal ICA bulb. Also bilateral ICA siphon calcific vessels with no stenosis. 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  Of most concern she has a new left upper lobe 55mm nodule highly suspicious for bronchogenic carcinoma.  MEDICAL ISSUES: Discuss these findings with patient. I do not feel that she is having any symptoms related to her moderate left carotid stenosis. Have recommended that we see her again in one year with carotid duplex follow-up. I suspect that her arm tingling is related to her spinal stenosis and she will continue to monitor this in light of her new finding of lung mass. We have referred her to Dr. Roxan Hockey with TCT S and will obtain a PET scan of her chest prior to her visit with him.    Rosetta Posner, MD FACS [facility] Tel [phone] Pager [phone]

## 2016-12-19 ENCOUNTER — Other Ambulatory Visit: Payer: Self-pay

## 2016-12-19 ENCOUNTER — Other Ambulatory Visit: Payer: Self-pay | Admitting: Vascular Surgery

## 2016-12-19 DIAGNOSIS — R200 Anesthesia of skin: Secondary | ICD-10-CM

## 2016-12-19 DIAGNOSIS — R918 Other nonspecific abnormal finding of lung field: Secondary | ICD-10-CM

## 2016-12-19 DIAGNOSIS — R202 Paresthesia of skin: Secondary | ICD-10-CM

## 2016-12-19 DIAGNOSIS — M4804 Spinal stenosis, thoracic region: Secondary | ICD-10-CM

## 2016-12-20 ENCOUNTER — Telehealth: Payer: Self-pay | Admitting: Vascular Surgery

## 2016-12-20 NOTE — Telephone Encounter (Signed)
Per Dr.Early's instructions, I scheduled an appointment for the patient to have a PETscan on Friday 01/06/17 at 10am. The patient is to arrive at 9:30am and to be NPO after midnight. The PETscan will be done at [facility]. Also, Dr.Early referred the patient to [facility]. I placed the referral in Epic and their office will contact the patient to schedule an appt. I left a detailed VM for the patient with the above information. awt

## 2016-12-26 ENCOUNTER — Other Ambulatory Visit: Payer: Self-pay | Admitting: Vascular Surgery

## 2016-12-30 ENCOUNTER — Encounter: Payer: Federal, State, Local not specified - PPO | Admitting: Thoracic Surgery (Cardiothoracic Vascular Surgery)

## 2017-01-06 ENCOUNTER — Encounter (HOSPITAL_COMMUNITY)
Admission: RE | Admit: 2017-01-06 | Discharge: 2017-01-06 | Disposition: A | Discharge status: Home / Self Care | Payer: Federal, State, Local not specified - PPO | Source: Ambulatory Visit | Attending: Vascular Surgery | Admitting: Vascular Surgery

## 2017-01-06 DIAGNOSIS — R918 Other nonspecific abnormal finding of lung field: Secondary | ICD-10-CM

## 2017-01-06 DIAGNOSIS — R200 Anesthesia of skin: Secondary | ICD-10-CM

## 2017-01-06 DIAGNOSIS — M4804 Spinal stenosis, thoracic region: Secondary | ICD-10-CM | POA: Diagnosis not present

## 2017-01-06 DIAGNOSIS — R202 Paresthesia of skin: Secondary | ICD-10-CM | POA: Insufficient documentation

## 2017-01-06 DIAGNOSIS — R911 Solitary pulmonary nodule: Secondary | ICD-10-CM | POA: Diagnosis not present

## 2017-01-06 LAB — GLUCOSE, CAPILLARY: Glucose-Capillary: 128 mg/dL — ABNORMAL HIGH (ref 65–99)

## 2017-01-06 MED ORDER — FLUDEOXYGLUCOSE F - 18 (FDG) INJECTION
10.8000 | Freq: Once | INTRAVENOUS | Status: AC | PRN
  Administered 2017-01-06: 10.8 via INTRAVENOUS

## 2017-01-09 ENCOUNTER — Encounter: Payer: Self-pay | Admitting: Thoracic Surgery (Cardiothoracic Vascular Surgery)

## 2017-01-09 DIAGNOSIS — M65312 Trigger thumb, left thumb: Secondary | ICD-10-CM | POA: Diagnosis not present

## 2017-01-09 DIAGNOSIS — M79645 Pain in left finger(s): Secondary | ICD-10-CM | POA: Diagnosis not present

## 2017-01-12 ENCOUNTER — Encounter: Payer: Self-pay | Admitting: Thoracic Surgery (Cardiothoracic Vascular Surgery)

## 2017-01-12 ENCOUNTER — Institutional Professional Consult (permissible substitution) (INDEPENDENT_AMBULATORY_CARE_PROVIDER_SITE_OTHER): Payer: Federal, State, Local not specified - PPO | Admitting: Thoracic Surgery (Cardiothoracic Vascular Surgery)

## 2017-01-12 ENCOUNTER — Other Ambulatory Visit: Payer: Self-pay | Admitting: *Deleted

## 2017-01-12 VITALS — BP 128/72 | HR 100 | Resp 20 | Ht 64.0 in | Wt 209.0 lb

## 2017-01-12 DIAGNOSIS — R918 Other nonspecific abnormal finding of lung field: Secondary | ICD-10-CM

## 2017-01-12 DIAGNOSIS — A159 Respiratory tuberculosis unspecified: Secondary | ICD-10-CM

## 2017-01-12 NOTE — Progress Notes (Signed)
PCP is Patient, No Pcp Per Referring Provider is Early, Arvilla Meres, MD  Chief Complaint  Patient presents with  . Lung Mass    Surgical eval, PET Scan 01/06/17, CTA Head/ Neck 12/12/16,    HPI: Susan Farley is a 56 year old woman sent for consultation regarding a left upper lobe nodule  Susan Farley is a 55 year old woman with a history of heavy tobacco abuse (80 pack years, 2 packs per day since age 68), "asthma", type 2 diabetes, hypertension, hyperlipidemia, heart murmur, thalamic stroke in 2015, positive PPD in 1988, reflux, sleep apnea, and depression. She has been followed by Dr. Donnetta Hutching since she had her stroke in 2015. She recently had a CT of the head and neck to evaluate her carotid stenosis. It showed a 1.5 cm nodule in the left upper lobe. A PET CT showed the nodule was markedly hypermetabolic with an SUV of 25.3. There is no hypermetabolic hilar or mediastinal adenopathy. There were areas of hypermetabolism in the nasopharynx without CT correlates. There was an area of hypermetabolism in the full for region and hypermetabolic inguinal lymph nodes.  She denies any change in appetite or recent weight loss. She gained 4 pounds in the past 3 months. She gets short of breath on exertion but says she can do 2 flights of stairs without stopping. She does have frequent wheezing told she was diagnosed with asthma in 1998. She has reflux. She does not have any residual effects from her stroke. She denies any chest pain, pressure, or tightness. She denies claudication.  Zubrod Score: At the time of surgery this patient's most appropriate activity status/level should be described as: [x]     0    Normal activity, no symptoms []     1    Restricted in physical strenuous activity but ambulatory, able to do out light work []     2    Ambulatory and capable of self care, unable to do work activities, up and about >50 % of waking hours                              []     3    Only limited self care, in bed  greater than 50% of waking hours []     4    Completely disabled, no self care, confined to bed or chair []     5    Moribund   Past Medical History:  Diagnosis Date  . Colitis   . Hypercholesteremia   . Hypertension   . Retinitis pigmentosa   . Stroke [facility]) Nov. 2015    Past Surgical History:  Procedure Laterality Date  . BONE GRAFT HIP ILIAC CREST    . CATARACT EXTRACTION    . IUD REMOVAL    . OOPHORECTOMY      Family History  Problem Relation Age of Onset  . Adopted: Yes  . Diabetes Unknown        BIOLOGICAL AUNT  . Heart disease Unknown        BIOLOGICAL AUNT    Social History Social History  Substance Use Topics  . Smoking status: Current Every Day Smoker    Packs/day: 1.00    Types: Cigarettes, E-cigarettes  . Smokeless tobacco: Never Used     Comment: 1 1/2 pk per day plus vaporized cigarette.   . Alcohol use No    Current Outpatient Prescriptions  Medication Sig Dispense Refill  . aspirin EC 325 MG  EC tablet Take 1 tablet (325 mg total) by mouth daily. 30 tablet 0  . docusate sodium (COLACE) 100 MG capsule Take 100 mg by mouth at bedtime as needed for mild constipation.    . dorzolamide (TRUSOPT) 2 % ophthalmic solution Place 1 drop into both eyes 2 (two) times daily.    . hydrochlorothiazide (HYDRODIURIL) 25 MG tablet Take 25 mg by mouth daily.    . pantoprazole (PROTONIX) 40 MG tablet Take 40 mg by mouth daily.     . QUEtiapine (SEROQUEL) 50 MG tablet Take 25 mg by mouth at bedtime.    . simvastatin (ZOCOR) 40 MG tablet Take 40 mg by mouth daily.    . timolol (BETIMOL) 0.5 % ophthalmic solution Place 1 drop into both eyes 2 (two) times daily.    Marland Kitchen venlafaxine XR (EFFEXOR-XR) 150 MG 24 hr capsule Take 150 mg by mouth daily with breakfast. Reported on 07/01/2015    . Vitamin D, Ergocalciferol, (DRISDOL) 50000 UNITS CAPS capsule Take 50,000 Units by mouth every 7 (seven) days.     No current facility-administered medications for this visit.     Allergies   Allergen Reactions  . Shellfish Allergy Anaphylaxis    Review of Systems  Constitutional: Negative for activity change, appetite change and unexpected weight change.  HENT: Negative for trouble swallowing and voice change.   Eyes: Negative for visual disturbance.  Respiratory: Positive for apnea, cough, shortness of breath (With heavy exertion) and wheezing.   Cardiovascular: Negative for chest pain, palpitations and leg swelling.  Gastrointestinal: Positive for abdominal pain (Reflux). Negative for blood in stool.  Genitourinary: Negative for difficulty urinating, dysuria, vaginal bleeding and vaginal discharge.  Musculoskeletal: Negative for arthralgias and gait problem.  Neurological: Positive for numbness (Right upper and lower extremity). Negative for seizures, syncope, weakness and headaches.  Hematological: Negative for adenopathy. Does not bruise/bleed easily.  Psychiatric/Behavioral: Positive for dysphoric mood.  All other systems reviewed and are negative.   BP 128/72   Pulse 100   Resp 20   Ht 5\' 4"  (1.626 m)   Wt 209 lb (94.8 kg)   LMP 11/25/2011   SpO2 97% Comment: RA  BMI 35.87 kg/m  Physical Exam  Constitutional: She is oriented to person, place, and time. She appears well-developed and well-nourished. No distress.  HENT:  Head: Normocephalic and atraumatic.  Mouth/Throat: No oropharyngeal exudate.  Eyes: Conjunctivae and EOM are normal. No scleral icterus.  Neck: Neck supple. No thyromegaly present.  Cardiovascular: Normal rate and regular rhythm.   Murmur (2/6 systolic murmur left upper sternal border) heard. Pulmonary/Chest: Effort normal and breath sounds normal. No respiratory distress. She has no wheezes. She has no rales.  Abdominal: Bowel sounds are normal. She exhibits no distension. There is no tenderness.  Musculoskeletal: She exhibits no edema or deformity.  Lymphadenopathy:    She has no cervical adenopathy.  Neurological: She is alert and  oriented to person, place, and time. No cranial nerve deficit. She exhibits normal muscle tone.  Skin: Skin is warm and dry.  Vitals reviewed.    Diagnostic Tests: NUCLEAR MEDICINE PET SKULL BASE TO THIGH  TECHNIQUE: 10.8 mCi F-18 FDG was injected intravenously. Full-ring PET imaging was performed from the skull base to thigh after the radiotracer. CT data was obtained and used for attenuation correction and anatomic localization.  FASTING BLOOD GLUCOSE:  Value: 128 mg/dl  COMPARISON:  CT of the head neck 12/12/2016.  FINDINGS: NECK  Multiple areas of hypermetabolism noted throughout [facility]  ring, most notably in the midline of the adenoids in the posterior nasopharynx where there is hypermetabolism (SUVmax = 8.6), which does not appear to correspond to any focal mass or suspicious abnormality on the recent contrast enhanced neck CT; overall, these findings are favored to be physiologic. No hypermetabolic lymphadenopathy noted in the neck.  CHEST  The previously noted left upper lobe pulmonary nodule currently measures 1.7 cm in diameter (axial image 58 of series 5) and is hypermetabolic (SUVmax = 86.5), concerning for neoplasm. No other suspicious appearing hypermetabolic pulmonary nodules are noted. No hypermetabolic mediastinal or hilar lymph nodes. Areas of low-level hypermetabolism are noted within both breasts in the otherwise nondescript appearing areas of fibroglandular densities on the CT images (SUVmax = 4.0 on the right and 3.7 on the left). There is also a lymph node in the axillary tail of the right breast which measures only 6 mm in short axis and has a normal fatty hilum but also demonstrates low-level hypermetabolism (SUVmax = 4.2). Heart size is mildly enlarged. There is no significant pericardial fluid, thickening or pericardial calcification. There is aortic atherosclerosis, as well as atherosclerosis of the great vessels of the mediastinum  and the coronary arteries, including calcified atherosclerotic plaque in the left main, left anterior descending, left circumflex and right coronary arteries.  ABDOMEN/PELVIS  No abnormal hypermetabolic activity within the liver, pancreas, adrenal glands, or spleen. No hypermetabolic lymph nodes in the abdomen. However, in the superficial aspect of the right vulvar region there is a 7 x 17 mm area of sessile soft tissue attenuation which is hypermetabolic (SUVmax = 7.8), with several borderline enlarged and enlarged inguinal lymph nodes bilaterally which demonstrate hypermetabolism, the largest and most hypermetabolic of which is a 25 mm right inguinal lymph node (axial image 181 of series 4) which is hypermetabolic (SUVmax = 7.5). Aortic atherosclerosis. Probable fibroid uterus, with the largest partially calcified lesion measuring 3.5 cm extending exophytically from the left side of the fundus.  SKELETON  No focal hypermetabolic activity to suggest skeletal metastasis.  IMPRESSION: 1. 1.7 cm left upper lobe pulmonary nodule is hypermetabolic highly concerning for neoplasm. Whether this represents a primary bronchogenic carcinoma or a solitary pulmonary metastasis is uncertain. No hypermetabolic mediastinal or hilar lymphadenopathy noted. 2. Sessile skin lesion in the right vulvar region which demonstrates hypermetabolism with bilateral inguinal lymphadenopathy. Findings are concerning for potential vulvar carcinoma and clinical correlation is recommended. 3. Multiple small areas of low-level hypermetabolism in the breasts bilaterally with mildly hypermetabolic lymph node in the axillary tail of the right breast. These findings are nonspecific if, but correlation with mammography is recommended. 4. Rather diffuse hypermetabolism throughout Waldeyer's ring, most focal in the midline of the adenoids in the posterior nasopharynx. This is favored to be physiologic, as there  is no definite pathologic correlate noted in these regions on recent contrast enhanced neck CT. 5. Aortic atherosclerosis, in addition to left main and 3 vessel coronary artery disease. Please note that although the presence of coronary artery calcium documents the presence of coronary artery disease, the severity of this disease and any potential stenosis cannot be assessed on this non-gated CT examination. Assessment for potential risk factor modification, dietary therapy or pharmacologic therapy may be warranted, if clinically indicated. 6. Mild cardiomegaly. 7. Fibroid uterus.   Electronically Signed   By: Vinnie Langton M.D.   On: 01/06/2017 13:24 I personally reviewed the CT and PET CT images and concur with the findings noted above.  Impression: 56 year old woman with  a history of heavy tobacco abuse has an incidentally discovered left upper lobe nodule. This is markedly hypermetabolic on PET with an SUV of 12.8. Has to be considered a new primary bronchogenic carcinoma must be proven otherwise. She had some abnormalities in the vulvar region on PET as well and she will see her gynecologist about that. It is possible that the lung lesion represents metastatic disease.  She has a history of positive back in 1988 and was treated with INH. For that reason I think we should check a Quanteferon Gold, but I would be surprised if this turned out to be granulomatous disease.  She needs pulmonary function testing with and without bronchodilators.  She does have evidence of coronary atherosclerosis on PET, but does not have any symptoms suspicious for angina.    Regarding the lung nodule. I think there is a high probability this is a primary lung cancer. My approach that would be left VATS with wedge resection followed by possible left upper lobectomy if the frozen section was positive for cancer. Another option would be to do a biopsy and sent for primary radiation treatment. She would  most likely be a candidate for stereotactic radiation. We discussed the pros and cons of each approach.  We discussed the general nature of the proposed operation including the need for general anesthesia, the incisions to be used, using the drainage tube postoperatively, expected hospital stay, and the overall recovery. I informed her the indications, risks, benefits, and alternatives. She understands the risk include, but are not limited to death, MI, DVT, PE, bleeding, possible need for transfusion, infection, stroke, prolonged air leak, irregular heart rhythms, as well as the possibility of other unforeseeable complications.  She wishes to think about her options. She will contact her gynecologist. She will let us know how she would like to proceed.  Plan: Quanteferon gold Gyn evaluation PFTs Left VATS, wedge resection, possible lobectomy  Melrose Nakayama, MD [facility] [phone]

## 2017-01-13 ENCOUNTER — Other Ambulatory Visit: Payer: Self-pay | Admitting: *Deleted

## 2017-01-13 DIAGNOSIS — J984 Other disorders of lung: Secondary | ICD-10-CM

## 2017-01-16 DIAGNOSIS — A159 Respiratory tuberculosis unspecified: Secondary | ICD-10-CM | POA: Diagnosis not present

## 2017-01-17 ENCOUNTER — Ambulatory Visit: Payer: Federal, State, Local not specified - PPO | Admitting: Family

## 2017-01-17 ENCOUNTER — Encounter (HOSPITAL_COMMUNITY): Payer: Federal, State, Local not specified - PPO

## 2017-01-17 DIAGNOSIS — M65312 Trigger thumb, left thumb: Secondary | ICD-10-CM | POA: Diagnosis not present

## 2017-01-18 LAB — QUANTIFERON TB GOLD ASSAY (BLOOD)
INTERFERON GAMMA RELEASE ASSAY: NEGATIVE
QUANTIFERON NIL VALUE: 0.15 [IU]/mL
QUANTIFERON TB AG MINUS NIL: 0 [IU]/mL

## 2017-01-19 DIAGNOSIS — R590 Localized enlarged lymph nodes: Secondary | ICD-10-CM | POA: Diagnosis not present

## 2017-01-19 DIAGNOSIS — D259 Leiomyoma of uterus, unspecified: Secondary | ICD-10-CM | POA: Diagnosis not present

## 2017-01-19 DIAGNOSIS — Z6836 Body mass index (BMI) 36.0-36.9, adult: Secondary | ICD-10-CM | POA: Diagnosis not present

## 2017-01-20 DIAGNOSIS — H35352 Cystoid macular degeneration, left eye: Secondary | ICD-10-CM | POA: Diagnosis not present

## 2017-01-20 DIAGNOSIS — H3552 Pigmentary retinal dystrophy: Secondary | ICD-10-CM | POA: Diagnosis not present

## 2017-01-20 DIAGNOSIS — H35372 Puckering of macula, left eye: Secondary | ICD-10-CM | POA: Diagnosis not present

## 2017-01-20 DIAGNOSIS — E113313 Type 2 diabetes mellitus with moderate nonproliferative diabetic retinopathy with macular edema, bilateral: Secondary | ICD-10-CM | POA: Diagnosis not present

## 2017-01-23 DIAGNOSIS — N909 Noninflammatory disorder of vulva and perineum, unspecified: Secondary | ICD-10-CM | POA: Diagnosis not present

## 2017-01-24 ENCOUNTER — Telehealth: Payer: Self-pay | Admitting: *Deleted

## 2017-01-25 NOTE — Telephone Encounter (Signed)
Called [facility] and left a message for Seth Bake with the new patient appt of August 15th at 11:30am.

## 2017-01-26 DIAGNOSIS — E113312 Type 2 diabetes mellitus with moderate nonproliferative diabetic retinopathy with macular edema, left eye: Secondary | ICD-10-CM | POA: Diagnosis not present

## 2017-02-01 ENCOUNTER — Ambulatory Visit: Payer: Federal, State, Local not specified - PPO | Attending: Gynecologic Oncology | Admitting: Gynecologic Oncology

## 2017-02-01 ENCOUNTER — Encounter: Payer: Self-pay | Admitting: Gynecologic Oncology

## 2017-02-01 VITALS — BP 115/60 | HR 76 | Temp 97.8°F | Resp 20 | Ht 64.0 in | Wt 217.0 lb

## 2017-02-01 DIAGNOSIS — I10 Essential (primary) hypertension: Secondary | ICD-10-CM | POA: Insufficient documentation

## 2017-02-01 DIAGNOSIS — N843 Polyp of vulva: Secondary | ICD-10-CM | POA: Diagnosis not present

## 2017-02-01 DIAGNOSIS — E7800 Pure hypercholesterolemia, unspecified: Secondary | ICD-10-CM | POA: Insufficient documentation

## 2017-02-01 DIAGNOSIS — Z9849 Cataract extraction status, unspecified eye: Secondary | ICD-10-CM | POA: Insufficient documentation

## 2017-02-01 DIAGNOSIS — R911 Solitary pulmonary nodule: Secondary | ICD-10-CM

## 2017-02-01 DIAGNOSIS — Z833 Family history of diabetes mellitus: Secondary | ICD-10-CM | POA: Insufficient documentation

## 2017-02-01 DIAGNOSIS — Z7982 Long term (current) use of aspirin: Secondary | ICD-10-CM | POA: Insufficient documentation

## 2017-02-01 DIAGNOSIS — Z8673 Personal history of transient ischemic attack (TIA), and cerebral infarction without residual deficits: Secondary | ICD-10-CM | POA: Diagnosis not present

## 2017-02-01 DIAGNOSIS — R918 Other nonspecific abnormal finding of lung field: Secondary | ICD-10-CM | POA: Insufficient documentation

## 2017-02-01 DIAGNOSIS — N762 Acute vulvitis: Secondary | ICD-10-CM | POA: Diagnosis not present

## 2017-02-01 DIAGNOSIS — F17210 Nicotine dependence, cigarettes, uncomplicated: Secondary | ICD-10-CM | POA: Diagnosis not present

## 2017-02-01 DIAGNOSIS — Z9889 Other specified postprocedural states: Secondary | ICD-10-CM | POA: Diagnosis not present

## 2017-02-01 DIAGNOSIS — Z8249 Family history of ischemic heart disease and other diseases of the circulatory system: Secondary | ICD-10-CM | POA: Insufficient documentation

## 2017-02-01 DIAGNOSIS — R590 Localized enlarged lymph nodes: Secondary | ICD-10-CM | POA: Insufficient documentation

## 2017-02-01 DIAGNOSIS — E119 Type 2 diabetes mellitus without complications: Secondary | ICD-10-CM | POA: Diagnosis not present

## 2017-02-01 DIAGNOSIS — N9089 Other specified noninflammatory disorders of vulva and perineum: Secondary | ICD-10-CM | POA: Diagnosis not present

## 2017-02-01 DIAGNOSIS — Z7984 Long term (current) use of oral hypoglycemic drugs: Secondary | ICD-10-CM | POA: Diagnosis not present

## 2017-02-01 DIAGNOSIS — Z91013 Allergy to seafood: Secondary | ICD-10-CM | POA: Insufficient documentation

## 2017-02-01 DIAGNOSIS — D259 Leiomyoma of uterus, unspecified: Secondary | ICD-10-CM | POA: Insufficient documentation

## 2017-02-01 NOTE — Patient Instructions (Signed)
We will call you with the results of your biopsies from today.  We will also place an order for you to have a biopsy performed of the enlarged groin lymph node.  We will see if they are able to perform this on Monday when you are having your lung biopsy.  VULVAR BIOPSY POST-PROCEDURE INSTRUCTIONS  1. You may take Ibuprofen, Aleve or Tylenol for pain if needed.    2. You may have a small amount of spotting.  You should wear a mini pad for the next few days.  3. You may use some topical Neosporin ointment if you would like (over the counter is fine).  4. You need to call if you have redness around the biopsy site, if there is any unusual draining, if the bleeding is heavy, or if you are concerned.  5. Shower or bathe as normal  6. We will call you within one week with results or we will discuss the results at your follow-up appointment if needed.

## 2017-02-01 NOTE — Pre-Procedure Instructions (Signed)
KASYN STOUFFER  02/01/2017      [id number] [place], [place] - [address] [facility] [address] [place] [address] [place] [address] Phone: [phone] Fax: [phone]    Your procedure is scheduled on Monday August 20.  Report to [facility] Admitting at 5:30 A.M.  Call this number if you have problems the morning of surgery:  [phone]   Remember:  Do not eat food or drink liquids after midnight.  Take these medicines the morning of surgery with A SIP OF WATER: pantoprazole (protonix), venlafaxine (effexor), eye drops  7 days prior to surgery STOP taking any Aspirin, Aleve, Naproxen, Ibuprofen, Motrin, Advil, Goody's, BC's, all herbal medications, fish oil, and all vitamins   WHAT DO I DO ABOUT MY DIABETES MEDICATION?   Marland Kitchen Do not take oral diabetes medicines (pills) the morning of surgery. DO NOT TAKE Metformin (glucophage) the day of surgery   How to Manage Your Diabetes Before and After Surgery  Why is it important to control my blood sugar before and after surgery? . Improving blood sugar levels before and after surgery helps healing and can limit problems. . A way of improving blood sugar control is eating a healthy diet by: o  Eating less sugar and carbohydrates o  Increasing activity/exercise o  Talking with your doctor about reaching your blood sugar goals . High blood sugars (greater than 180 mg/dL) can raise your risk of infections and slow your recovery, so you will need to focus on controlling your diabetes during the weeks before surgery. . Make sure that the doctor who takes care of your diabetes knows about your planned surgery including the date and location.  How do I manage my blood sugar before surgery? . Check your blood sugar at least 4 times a day, starting 2 days before surgery, to make sure that the level is not too high or low. o Check your blood sugar the morning of your surgery when you wake up and  every 2 hours until you get to the Short Stay unit. . If your blood sugar is less than 70 mg/dL, you will need to treat for low blood sugar: o Do not take insulin. o Treat a low blood sugar (less than 70 mg/dL) with  cup of clear juice (cranberry or apple), 4 glucose tablets, OR glucose gel. o Recheck blood sugar in 15 minutes after treatment (to make sure it is greater than 70 mg/dL). If your blood sugar is not greater than 70 mg/dL on recheck, call [phone] for further instructions. . Report your blood sugar to the short stay nurse when you get to Short Stay.  . If you are admitted to the hospital after surgery: o Your blood sugar will be checked by the staff and you will probably be given insulin after surgery (instead of oral diabetes medicines) to make sure you have good blood sugar levels. o The goal for blood sugar control after surgery is 80-180 mg/dL.               Do not wear jewelry, make-up or nail polish.  Do not wear lotions, powders, or perfumes, or deoderant.  Do not shave 48 hours prior to surgery.  Men may shave face and neck.  Do not bring valuables to the hospital.  [facility] is not responsible for any belongings or valuables.  Contacts, dentures or bridgework may not be worn into surgery.  Leave your suitcase in  the car.  After surgery it may be brought to your room.  For patients admitted to the hospital, discharge time will be determined by your treatment team.  Patients discharged the day of surgery will not be allowed to drive home.    Special instructions:    [facility]- Preparing For Surgery  Before surgery, you can play an important role. Because skin is not sterile, your skin needs to be as free of germs as possible. You can reduce the number of germs on your skin by washing with CHG (chlorahexidine gluconate) Soap before surgery.  CHG is an antiseptic cleaner which kills germs and bonds with the skin to continue killing germs even after  washing.  Please do not use if you have an allergy to CHG or antibacterial soaps. If your skin becomes reddened/irritated stop using the CHG.  Do not shave (including legs and underarms) for at least 48 hours prior to first CHG shower. It is OK to shave your face.  Please follow these instructions carefully.   1. Shower the NIGHT BEFORE SURGERY and the MORNING OF SURGERY with CHG.   2. If you chose to wash your hair, wash your hair first as usual with your normal shampoo.  3. After you shampoo, rinse your hair and body thoroughly to remove the shampoo.  4. Use CHG as you would any other liquid soap. You can apply CHG directly to the skin and wash gently with a scrungie or a clean washcloth.   5. Apply the CHG Soap to your body ONLY FROM THE NECK DOWN.  Do not use on open wounds or open sores. Avoid contact with your eyes, ears, mouth and genitals (private parts). Wash genitals (private parts) with your normal soap.  6. Wash thoroughly, paying special attention to the area where your surgery will be performed.  7. Thoroughly rinse your body with warm water from the neck down.  8. DO NOT shower/wash with your normal soap after using and rinsing off the CHG Soap.  9. Pat yourself dry with a CLEAN TOWEL.   10. Wear CLEAN PAJAMAS   11. Place CLEAN SHEETS on your bed the night of your first shower and DO NOT SLEEP WITH PETS.    Day of Surgery: Do not apply any deodorants/lotions. Please wear clean clothes to the hospital/surgery center.      Please read over the following fact sheets that you were given. Coughing and Deep Breathing and MRSA Information

## 2017-02-01 NOTE — Progress Notes (Signed)
Consult Note: Gyn-Onc  Consult was requested by Dr. Murrell Redden for the evaluation of Susan Farley 55 y.o. female  CC:  Chief Complaint  Patient presents with  . vulvar mass    Assessment/Plan:  Susan Farley  is a 56 y.o.  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  I have a low suspicion that the vulvar lesions I biopsied today are malignant. I see no other vulvar or vaginal lesions and the [place] was unremarkable for pathology that might cause a right inguinal metastasis.  I recommend US guided needle biopsy/aspiration of right inguinal node (which is the most concerning on imaging).  If negative, we can be reassured that there is no occult gynecologic malignancy. We will attempt to coordinate the biopsy on August 20th when she is scheduled for bronchoscopic biopsy of the left lung lesion.   HPI: Susan Farley is a 56 year old woman who is seen in consultation at the request of Dr Murrell Redden for PET avid inguinal nodes and a PET avid vulvar lesion in the setting of palpable right inguinal adenopathy.  The patient has a history of upper extremity tingling in June, 2018. This prompted a CT angiogram which demonstrated a left upper lobe nodule. To better characterize the lesion she then underwent PET/CT on 01/06/17 which showed a hypermetabolic left upper lobe lung mass measuring 1.7cm and bilateral inguinal hypermetabolic adenopathy with a very hot and enlarged (2.5cm) right inguinal node. There was a 46mm area of sessile soft tissue attenuation which was hypermetabolic on the right vulvar region.  The patient is scheduled for biopsy (bronchoscopic) of the left lung mass on 02/06/17.   She reports having irritation from "skin bumps" which are painful and drain fluid. She denies pruritis. She has a remote history of an abnormal pap smear.  She has no postmenopausal bleeding. CA 125 on  01/23/17 was nromal at 9.7. A transvaginal US on 01/23/17 showed a normal uterus with small fibroids measuring 6.7x4.7x5cm. The ovaries showed a 2.1cm central vascular calcification on the right, and the left showed a 3cm complex area (no increased blood flow). Both ovaries were normal on the July PET scan.  She is a heavy smoker.  Current Meds:  Outpatient Encounter Prescriptions as of 02/01/2017  Medication Sig  . aspirin EC 325 MG EC tablet Take 1 tablet (325 mg total) by mouth daily.  . [facility] FIBER GUMMIES PO Take 2 capsules by mouth daily.  Marland Kitchen docusate sodium (COLACE) 100 MG capsule Take 200 mg by mouth at bedtime.   . dorzolamide (TRUSOPT) 2 % ophthalmic solution Place 1 drop into both eyes 2 (two) times daily.  . hydrochlorothiazide (HYDRODIURIL) 25 MG tablet Take 25 mg by mouth daily.  . metFORMIN (GLUCOPHAGE) 500 MG tablet Take 500 mg by mouth 2 (two) times daily with a meal.  . pantoprazole (PROTONIX) 40 MG tablet Take 80 mg by mouth daily.   . QUEtiapine (SEROQUEL) 25 MG tablet Take 25 mg by mouth at bedtime.  . rosuvastatin (CRESTOR) 20 MG tablet Take 20 mg by mouth every evening.  . timolol (BETIMOL) 0.5 % ophthalmic solution Place 1 drop into both eyes 2 (two) times daily.  . varenicline (CHANTIX STARTING MONTH PAK) 0.5 MG X 11 & 1 MG X 42 tablet Take 0.5-1 mg by mouth 2 (two) times daily. Take one 0.5 mg tablet by mouth once daily for 3 days, then increase to  one 0.5 mg tablet twice daily for 4 days, then increase to one 1 mg tablet twice daily.  Marland Kitchen venlafaxine XR (EFFEXOR-XR) 37.5 MG 24 hr capsule Take 187.5 mg by mouth at bedtime.  . Vitamin D, Ergocalciferol, (DRISDOL) 50000 UNITS CAPS capsule Take 50,000 Units by mouth every 7 (seven) days.   No facility-administered encounter medications on file as of 02/01/2017.     Allergy:  Allergies  Allergen Reactions  . Shellfish Allergy Anaphylaxis    Social Hx:   Social History   Social History  . Marital status: Single    Spouse  name: N/A  . Number of children: 0  . Years of education: 14    Occupational History  . Retired  [employer]   Social History Main Topics  . Smoking status: Current Every Day Smoker    Packs/day: 1.00    Years: 40.00    Types: Cigarettes, E-cigarettes  . Smokeless tobacco: Never Used     Comment: 1 1/2 pk per day plus vaporized cigarette.   . Alcohol use 0.0 oz/week     Comment: occasional beer  . Drug use: No  . Sexual activity: Not on file   Other Topics Concern  . Not on file   Social History Narrative   Patient is single with no children.   Patient is right handed.   Patient has 14 yrs of education.   Patient drinks 2 cups daily.         Epworth Sleepiness Scale Score: 9      --I have HTN   --I have had a stroke   --I seem to be losing my sex drive   --I feel stressed and lack motivation   --I have been told that I stop breathing during sleep   --I wake up to urinate frequently at night   --I wake up with a dry mouth or sore throat   --I feel excessively sleepy and tired during the day   --I have Diabetes   --I have been told that I snore   --I am overweight or am gaining weight   --I wake up gasping for breath during the night   --I have dozed off or fallen asleep at inappropriate times during the day    Past Surgical Hx:  Past Surgical History:  Procedure Laterality Date  . BONE GRAFT HIP ILIAC CREST    . CATARACT EXTRACTION    . IUD REMOVAL    . OOPHORECTOMY Right 2004   removal of cyst and ovary    Past Medical Hx:  Past Medical History:  Diagnosis Date  . Colitis   . Diabetes mellitus without complication ([place])   . Hypercholesteremia   . Hypertension   . Retinitis pigmentosa   . Stroke [facility]) Nov. 2015    Past Gynecological History:  Hx of abnromal paps Patient's last menstrual period was 11/25/2011.  Family Hx:  Family History  Problem Relation Age of Onset  . Adopted: Yes  . Diabetes Unknown        BIOLOGICAL AUNT  . Heart  disease Unknown        BIOLOGICAL AUNT    Review of Systems:  Constitutional  Feels well,    ENT Normal appearing ears and nares bilaterally Skin/Breast  No rash, sores, jaundice, itching, dryness Cardiovascular  No chest pain, shortness of breath, or edema  Pulmonary  No cough or wheeze.  Gastro Intestinal  No nausea, vomitting, or diarrhoea. No bright red blood per  rectum, no abdominal pain, change in bowel movement, or constipation.  Genito Urinary  No frequency, urgency, dysuria, no bleeding, + skin bumps on vulva Musculo Skeletal  No myalgia, arthralgia, joint swelling or pain  Neurologic  No weakness, numbness, change in gait,  Psychology  No depression, anxiety, insomnia.   Vitals:  Blood pressure 115/60, pulse 76, temperature 97.8 F (36.6 C), temperature source Oral, resp. rate 20, height 5\' 4"  (1.626 m), weight 217 lb (98.4 kg), last menstrual period 11/25/2011, SpO2 95 %.  Physical Exam: WD in NAD Neck  Supple NROM, without any enlargements.  Lymph Node Survey No cervical supraclavicular. + right inguinal adenopathy with a 2+cm firm mobile medial right inguinal node palpated. Cardiovascular  Pulse normal rate, regularity and rhythm. S1 and S2 normal.  Lungs  Clear to auscultation bilateraly, without wheezes/crackles/rhonchi. Good air movement.  Skin  No rash/lesions/breakdown  Psychiatry  Alert and oriented to person, place, and time  Abdomen  Normoactive bowel sounds, abdomen soft, non-tender and obese without evidence of hernia.  Back No CVA tenderness Genito Urinary  Vulva/vagina: Bilateral lesions on anterior mons pubis left and right consistent with caruncles. Biopsied  Bladder/urethra:  No lesions or masses, well supported bladder  Vagina: normal  Cervix: Normal appearing, no lesions.  Uterus:  Small, mobile, no parametrial involvement or nodularity.  Adnexa: no palpable masses. Rectal  Good tone, no masses no cul de sac nodularity.   Extremities  No bilateral cyanosis, clubbing or edema.  PROCEDURE NOTE: vulvar biopsy Patient provided verbal consent Time out performed. 1cc of 2% lidocaine infiltrated into both left and right mons pubis lesions after prepping with betadine. 17mm punch taken from central region of both. Left drained purulent fluid. Hemostasis obtained with silver nitrate. EBL: minimal Complications: none Specimens: left and right mons pubis to pathology   Donaciano Eva, MD  02/01/2017, 12:23 PM

## 2017-02-02 ENCOUNTER — Ambulatory Visit (HOSPITAL_COMMUNITY)
Admission: RE | Admit: 2017-02-02 | Discharge: 2017-02-02 | Disposition: A | Discharge status: Home / Self Care | Payer: Federal, State, Local not specified - PPO | Source: Ambulatory Visit | Attending: Thoracic Surgery (Cardiothoracic Vascular Surgery) | Admitting: Thoracic Surgery (Cardiothoracic Vascular Surgery)

## 2017-02-02 ENCOUNTER — Other Ambulatory Visit: Payer: Self-pay

## 2017-02-02 ENCOUNTER — Encounter (HOSPITAL_COMMUNITY)
Admission: RE | Admit: 2017-02-02 | Discharge: 2017-02-02 | Disposition: A | Discharge status: Home / Self Care | Payer: Federal, State, Local not specified - PPO | Source: Ambulatory Visit | Attending: Thoracic Surgery (Cardiothoracic Vascular Surgery) | Admitting: Thoracic Surgery (Cardiothoracic Vascular Surgery)

## 2017-02-02 ENCOUNTER — Encounter (HOSPITAL_COMMUNITY): Payer: Self-pay

## 2017-02-02 DIAGNOSIS — J449 Chronic obstructive pulmonary disease, unspecified: Secondary | ICD-10-CM | POA: Insufficient documentation

## 2017-02-02 DIAGNOSIS — J984 Other disorders of lung: Secondary | ICD-10-CM | POA: Insufficient documentation

## 2017-02-02 DIAGNOSIS — R918 Other nonspecific abnormal finding of lung field: Secondary | ICD-10-CM

## 2017-02-02 HISTORY — DX: Major depressive disorder, single episode, unspecified: F32.9

## 2017-02-02 HISTORY — DX: Gastro-esophageal reflux disease without esophagitis: K21.9

## 2017-02-02 HISTORY — DX: Unspecified asthma, uncomplicated: J45.909

## 2017-02-02 HISTORY — DX: Depression, unspecified: F32.A

## 2017-02-02 HISTORY — DX: Sleep apnea, unspecified: G47.30

## 2017-02-02 HISTORY — DX: Cardiac murmur, unspecified: R01.1

## 2017-02-02 LAB — HEMOGLOBIN A1C
Hgb A1c MFr Bld: 7 % — ABNORMAL HIGH (ref 4.8–5.6)
MEAN PLASMA GLUCOSE: 154.2 mg/dL

## 2017-02-02 LAB — COMPREHENSIVE METABOLIC PANEL
ALT: 26 U/L (ref 14–54)
AST: 26 U/L (ref 15–41)
Albumin: 3.9 g/dL (ref 3.5–5.0)
Alkaline Phosphatase: 83 U/L (ref 38–126)
Anion gap: 9 (ref 5–15)
BILIRUBIN TOTAL: 0.4 mg/dL (ref 0.3–1.2)
BUN: 8 mg/dL (ref 6–20)
CALCIUM: 9.8 mg/dL (ref 8.9–10.3)
CO2: 30 mmol/L (ref 22–32)
Chloride: 101 mmol/L (ref 101–111)
Creatinine, Ser: 0.81 mg/dL (ref 0.44–1.00)
GFR calc Af Amer: >60 mL/min (ref >60)
GFR calc non Af Amer: >60 mL/min (ref >60)
Glucose, Bld: 148 mg/dL — ABNORMAL HIGH (ref 65–99)
POTASSIUM: 3.3 mmol/L — AB (ref 3.5–5.1)
Sodium: 140 mmol/L (ref 135–145)
TOTAL PROTEIN: 7.6 g/dL (ref 6.5–8.1)

## 2017-02-02 LAB — CBC
HEMATOCRIT: 42.6 % (ref 36.0–46.0)
Hemoglobin: 13.9 g/dL (ref 12.0–15.0)
MCH: 28.8 pg (ref 26.0–34.0)
MCHC: 32.6 g/dL (ref 30.0–36.0)
MCV: 88.2 fL (ref 78.0–100.0)
Platelets: 327 10*3/uL (ref 150–400)
RBC: 4.83 MIL/uL (ref 3.87–5.11)
RDW: 16.5 % — AB (ref 11.5–15.5)
WBC: 12.7 10*3/uL — AB (ref 4.0–10.5)

## 2017-02-02 LAB — TYPE AND SCREEN
ABO/RH(D): O POS
ANTIBODY SCREEN: NEGATIVE

## 2017-02-02 LAB — PULMONARY FUNCTION TEST
DL/VA % pred: 71 %
DL/VA: 3.43 ml/min/mmHg/L
DLCO unc % pred: 58 %
DLCO unc: 14.12 ml/min/mmHg
FEF 25-75 PRE: 2.13 L/s
FEF 25-75 Post: 2.44 L/sec
FEF2575-%Change-Post: 14 %
FEF2575-%PRED-PRE: 95 %
FEF2575-%Pred-Post: 108 %
FEV1-%Change-Post: 3 %
FEV1-%PRED-POST: 104 %
FEV1-%PRED-PRE: 100 %
FEV1-PRE: 2.22 L
FEV1-Post: 2.3 L
FEV1FVC-%Change-Post: 1 %
FEV1FVC-%Pred-Pre: 99 %
FEV6-%CHANGE-POST: 1 %
FEV6-%PRED-POST: 105 %
FEV6-%PRED-PRE: 103 %
FEV6-POST: 2.83 L
FEV6-PRE: 2.77 L
FEV6FVC-%CHANGE-POST: 0 %
FEV6FVC-%PRED-POST: 103 %
FEV6FVC-%PRED-PRE: 102 %
FVC-%Change-Post: 1 %
FVC-%Pred-Post: 102 %
FVC-%Pred-Pre: 100 %
FVC-Post: 2.83 L
FVC-Pre: 2.78 L
POST FEV6/FVC RATIO: 100 %
PRE FEV1/FVC RATIO: 80 %
Post FEV1/FVC ratio: 81 %
Pre FEV6/FVC Ratio: 100 %
RV % pred: 108 %
RV: 2.05 L
TLC % PRED: 97 %
TLC: 4.93 L

## 2017-02-02 LAB — URINALYSIS, ROUTINE W REFLEX MICROSCOPIC
Bilirubin Urine: NEGATIVE
Glucose, UA: NEGATIVE mg/dL
Hgb urine dipstick: NEGATIVE
Ketones, ur: NEGATIVE mg/dL
LEUKOCYTES UA: NEGATIVE
NITRITE: NEGATIVE
PH: 5 (ref 5.0–8.0)
Protein, ur: NEGATIVE mg/dL
Specific Gravity, Urine: 1.013 (ref 1.005–1.030)

## 2017-02-02 LAB — PROTIME-INR
INR: 0.88
PROTHROMBIN TIME: 11.9 s (ref 11.4–15.2)

## 2017-02-02 LAB — APTT: aPTT: 27 seconds (ref 24–36)

## 2017-02-02 LAB — SURGICAL PCR SCREEN
MRSA, PCR: NEGATIVE
STAPHYLOCOCCUS AUREUS: NEGATIVE

## 2017-02-02 LAB — GLUCOSE, CAPILLARY: Glucose-Capillary: 136 mg/dL — ABNORMAL HIGH (ref 65–99)

## 2017-02-02 LAB — ABO/RH: ABO/RH(D): O POS

## 2017-02-02 MED ORDER — ALBUTEROL SULFATE (2.5 MG/3ML) 0.083% IN NEBU
2.5000 mg | INHALATION_SOLUTION | Freq: Once | RESPIRATORY_TRACT | Status: AC
Start: 2017-02-02 — End: 2017-02-02
  Administered 2017-02-02: 2.5 mg via RESPIRATORY_TRACT

## 2017-02-02 NOTE — Progress Notes (Addendum)
PCP - pt states she goes to the [place] in [facility] - none, pt denies cardiac hx Pt saw Dr. Denman George for infected hair follicles in pubic area yesterday and had these drained Pt sees Dr. Baird Cancer for retinology  Chest x-ray - DOS, needs to be within 72 hours EKG - 02/02/2017  ECHO - 04/25/14  Sleep Study - "a long long time ago", pt has BIPAP but does not wear  Fasting Blood Sugar - 100-150s  Spoke with Ryan at Dr. Leonarda Salon office to notify of possible infected hair follicles, also per Thurmond Butts pt to continue taking Aspirin, but to not take this the day of surgery. Pt verbalized understanding.   Patient denies shortness of breath, fever, cough and chest pain at PAT appointment  Patient verbalized understanding of instructions that were given to them at the PAT appointment. Patient was also instructed that they will need to review over the PAT instructions again at home before surgery.  Unable to get ABG at pre-op appointment, 1 attempt made and patient refused second attempt today. Will draw DOS

## 2017-02-03 ENCOUNTER — Encounter (HOSPITAL_COMMUNITY): Payer: Self-pay | Admitting: Vascular Surgery

## 2017-02-03 ENCOUNTER — Other Ambulatory Visit: Payer: Self-pay | Admitting: *Deleted

## 2017-02-03 NOTE — Progress Notes (Addendum)
Anesthesia Chart Review: Patient is a 56 year old female scheduled for left VATS/wedge resection, possible lobectomy on 02/06/17 by Dr. Roxan Hockey. She was referred by vascular surgery following finding of LUL lung nodule of recent CTA neck to evaluate carotid artery stenosis.  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 BMI is consistent with obesity. She underwent a vulvar biopsy on 02/01/17 by Dr. Everitt Amber. She also recommended an US guided needle biopsy/aspiration of right inguinal node (possibly at the the time of lung procedure) due to finding of hypermetabolism with bilateral inguinal lymphadenopathy on recent PET scan. She also had finding of left main and 3V CAD on PET, but this study is limited as it cannot tell the severity of disease or if there is any coronary stenosis. Dr. Roxan Hockey noted findings on PET and wrote, "She does have evidence of coronary atherosclerosis on PET, but does not have any symptoms suspicious for angina." He rated her Zubrod Score as 0: Normal activity, no symptoms.   No PCP is listed.  Neurologist is Dr. Rosalin Hawking. Vascular surgeon is Dr. Sherren Mocha Early. Cardiologist is Dr. Quay Burow, only seen on 04/26/16 for PAD.   Medications include ASA 325 mg, dexamethasone (intravitreal), Trosopt ophthalmic, HCTZ, metformin, Protonix, Seroquel, Crestor, Chantix, Effexor XR, timolol ophthalmic.  EKG 02/02/17: NSR, possible LAE, incomplete RBBB, non-specific T wave abnormality. QT 422/QTc 458 ms.  Echo 04/25/14: Study Conclusions - Left ventricle: The cavity size was normal. Wall thickness was increased in a pattern of mild LVH. Systolic function was vigorous. The estimated ejection fraction was in the range of 65% to 70%. Wall motion was normal; there were no regional wall motion abnormalities.  Carotid U/S  12/07/16: Impression:  Doppler velocities in the RICA are consistent with 1-39% stenosis. Velocities in the LICA are consistent with 60-79% stenosis (high end of range).  CTA head/neck 12/12/16: IMPRESSION: 1. New left upper lobe 16 mm lung nodule since November 2015 is highly suspicious for bronchogenic carcinoma. Recommend thoracic surgery consultation. CT Chest (IV contrast preferred) versus PET-CT would be most appropriate for further imaging evaluation. 2. Proximal left ICA atherosclerosis with some progression since 2015. Up to 60% stenosis at the distal left ICA bulb level now. 3. Stable bilateral ICA siphon calcified atherosclerosis without significant intracranial arterial stenosis. 4. Chronic cervical spine degeneration, with suspected multilevel degenerative cervical spinal stenosis. 5.  Normal CT appearance of the brain.  PET scan 01/06/17: IMPRESSION: 1. 1.7 cm left upper lobe pulmonary nodule is hypermetabolic highly concerning for neoplasm. Whether this represents a primary bronchogenic carcinoma or a solitary pulmonary metastasis is uncertain. No hypermetabolic mediastinal or hilar lymphadenopathy noted. 2. Sessile skin lesion in the right vulvar region which demonstrates hypermetabolism with bilateral inguinal lymphadenopathy. Findings are concerning for potential vulvar carcinoma and clinical correlation is recommended. 3. Multiple small areas of low-level hypermetabolism in the breasts bilaterally with mildly hypermetabolic lymph node in the axillary tail of the right breast. These findings are nonspecific if, but correlation with mammography is recommended. 4. Rather diffuse hypermetabolism throughout Waldeyer's ring, most focal in the midline of the adenoids in the posterior nasopharynx. This is favored to be physiologic, as there is no definite pathologic correlate noted in these regions on recent contrast enhanced neck CT. 5. Aortic atherosclerosis, in addition  to left main and 3 vessel coronary artery disease. Please note that although the presence of coronary artery calcium documents the presence  of coronary artery disease, the severity of this disease and any potential stenosis cannot be assessed on this non-gated CT examination. Assessment for potential risk factor modification, dietary therapy or pharmacologic therapy may be warranted, if clinically indicated. 6. Mild cardiomegaly. 7. Fibroid uterus.  PFTs 02/02/17: FVC 2.78 (100%), FEV1 2.22 (100%), DLCO unc 14.12 (58%).   Preoperative labs noted.  K 3.3. Cr 0.81. WBC 12.7. H/H 13.9/42.6. PT/PTT WNL. A1c 7.0. T&S done. UA WNL. Interferon Gamma Release Assay negative on 01/16/17. Unsuccessful attempt for ABG at PAT, so she will need one on the day of surgery.   I reviewed above with anesthesiologists Dr. Sabra Heck and Dr. Orene Desanctis. If patient remains asymptomatic from a CV standpoint then it is anticipated that she can proceed as planned. I did notify TCTS RN Ryan of Dr. Serita Grit notation discussing the possibility of doing right inguinal node biopsy at the time of this procedure. Dr. Roxan Hockey is out of the office today, but she will attempt to let him know so hopefully can address if this will be added to her procedure or addressed at a later date. (Update 02/03/17 4:26 PM: TCTS RN Ryan communicated that she was able to reach Dr. Roxan Hockey. Case to be rescheduled as he feels she will need inguinal node biopsy first.)  George Hugh [facility] Short Stay Center/Anesthesiology Phone [phone] 02/03/2017 3:31 PM

## 2017-02-06 ENCOUNTER — Telehealth: Payer: Self-pay

## 2017-02-06 ENCOUNTER — Inpatient Hospital Stay (HOSPITAL_COMMUNITY)
Admission: RE | Admit: 2017-02-06 | Payer: Federal, State, Local not specified - PPO | Source: Ambulatory Visit | Admitting: Thoracic Surgery (Cardiothoracic Vascular Surgery)

## 2017-02-06 ENCOUNTER — Encounter (HOSPITAL_COMMUNITY): Admission: RE | Payer: Self-pay | Source: Ambulatory Visit

## 2017-02-06 ENCOUNTER — Other Ambulatory Visit: Payer: Self-pay | Admitting: *Deleted

## 2017-02-06 DIAGNOSIS — R918 Other nonspecific abnormal finding of lung field: Secondary | ICD-10-CM

## 2017-02-06 SURGERY — VIDEO ASSISTED THORACOSCOPY (VATS)/WEDGE RESECTION
Anesthesia: General | Site: Chest | Laterality: Left

## 2017-02-06 NOTE — Telephone Encounter (Signed)
Told Ms Klemmer that the biopsy's of the vulva on 02-01-17 were negative for cancer per Joylene John, NP.

## 2017-02-07 ENCOUNTER — Other Ambulatory Visit: Payer: Self-pay | Admitting: Student

## 2017-02-07 ENCOUNTER — Other Ambulatory Visit: Payer: Self-pay | Admitting: Radiology

## 2017-02-08 ENCOUNTER — Encounter (HOSPITAL_COMMUNITY): Payer: Self-pay

## 2017-02-08 ENCOUNTER — Ambulatory Visit (HOSPITAL_COMMUNITY)
Admission: RE | Admit: 2017-02-08 | Discharge: 2017-02-08 | Disposition: A | Discharge status: Home / Self Care | Payer: Federal, State, Local not specified - PPO | Source: Ambulatory Visit | Attending: Gynecologic Oncology | Admitting: Gynecologic Oncology

## 2017-02-08 DIAGNOSIS — Z8673 Personal history of transient ischemic attack (TIA), and cerebral infarction without residual deficits: Secondary | ICD-10-CM | POA: Insufficient documentation

## 2017-02-08 DIAGNOSIS — Z87891 Personal history of nicotine dependence: Secondary | ICD-10-CM | POA: Diagnosis not present

## 2017-02-08 DIAGNOSIS — I6529 Occlusion and stenosis of unspecified carotid artery: Secondary | ICD-10-CM | POA: Diagnosis not present

## 2017-02-08 DIAGNOSIS — R590 Localized enlarged lymph nodes: Secondary | ICD-10-CM | POA: Diagnosis not present

## 2017-02-08 DIAGNOSIS — N9089 Other specified noninflammatory disorders of vulva and perineum: Secondary | ICD-10-CM | POA: Diagnosis not present

## 2017-02-08 LAB — CBC
HCT: 42.1 % (ref 36.0–46.0)
Hemoglobin: 14 g/dL (ref 12.0–15.0)
MCH: 29.2 pg (ref 26.0–34.0)
MCHC: 33.3 g/dL (ref 30.0–36.0)
MCV: 87.7 fL (ref 78.0–100.0)
Platelets: 323 10*3/uL (ref 150–400)
RBC: 4.8 MIL/uL (ref 3.87–5.11)
RDW: 16.6 % — ABNORMAL HIGH (ref 11.5–15.5)
WBC: 13.7 10*3/uL — ABNORMAL HIGH (ref 4.0–10.5)

## 2017-02-08 LAB — GLUCOSE, CAPILLARY: GLUCOSE-CAPILLARY: 136 mg/dL — AB (ref 65–99)

## 2017-02-08 NOTE — Procedures (Signed)
  Procedure:   [place] core R inguinal LAN 18g x4 in saline Preprocedure diagnosis:  Lung mass Postprocedure diagnosis:  same EBL:     minimal Complications:   none immediate  See full dictation in [employer].  Dillard Cannon MD Main # [phone] Pager  [phone]

## 2017-02-08 NOTE — Progress Notes (Signed)
Patient with past medical history of heavy smoking, CVA, carotid stenosis presents with complaint of lung nodule and associated lymphadenopathy.  IR consulted for lymph node biopsy at the request of Dr. Denman George. Case reviewed by Dr. Kathlene Cote who approves patient for procedure.  Patient presents today in their usual state of health.  She did eat a large breakfast this morning.  Discussed with Dr. Vernard Gambles who is willing to proceed without sedation.  Patient is agreeable.  Will obtain CBC.  Risks and benefits discussed with the patient including, but not limited to bleeding, infection, damage to adjacent structures or low yield requiring additional tests. All of the patient's questions were answered, patient is agreeable to proceed. Consent signed and in chart.

## 2017-02-11 ENCOUNTER — Encounter: Payer: Self-pay | Admitting: Gynecologic Oncology

## 2017-02-13 ENCOUNTER — Telehealth: Payer: Self-pay

## 2017-02-13 NOTE — Telephone Encounter (Signed)
Told Ms Shetterly that the lymph node biopsy was negative.  No gyn issue. Proceed with the evaluation of  lung issues per Joylene John, NP.

## 2017-02-16 DIAGNOSIS — H35372 Puckering of macula, left eye: Secondary | ICD-10-CM | POA: Diagnosis not present

## 2017-02-16 DIAGNOSIS — E113313 Type 2 diabetes mellitus with moderate nonproliferative diabetic retinopathy with macular edema, bilateral: Secondary | ICD-10-CM | POA: Diagnosis not present

## 2017-02-21 ENCOUNTER — Encounter (HOSPITAL_COMMUNITY): Payer: Self-pay

## 2017-02-21 NOTE — Pre-Procedure Instructions (Addendum)
MERIDITH ROMICK  02/21/2017      [id number] [place], [place] - [address] [place] [address] Phone: [phone] Fax: [phone]    Your procedure is scheduled on Friday September 7.  Report to [facility] Admitting at 9:50 A.M.  Call this number if you have problems the morning of surgery:  [phone]   Remember:  Do not eat food or drink liquids after midnight.  Take these medicines the morning of surgery with A SIP OF WATER: pantoprazole (protonix), eye drops  CONTINUE TAKING ALL medications as prescribed until the day before surgery, except follow these medication instructions:  7 days prior to surgery STOP taking any Aleve, Naproxen, Ibuprofen, Motrin, Advil, Goody's, BC's, all herbal medications, fish oil, and all vitamins  FOLLOW MD's instructions on stopping Aspirin   WHAT DO I DO ABOUT MY DIABETES MEDICATION?   Marland Kitchen Do not take oral diabetes medicines (pills) the morning of surgery. DO NOT TAKE metformin (Glucophage) the day of surgery   How to Manage Your Diabetes Before and After Surgery  Why is it important to control my blood sugar before and after surgery? . Improving blood sugar levels before and after surgery helps healing and can limit problems. . A way of improving blood sugar control is eating a healthy diet by: o  Eating less sugar and carbohydrates o  Increasing activity/exercise o  Talking with your doctor about reaching your blood sugar goals . High blood sugars (greater than 180 mg/dL) can raise your risk of infections and slow your recovery, so you will need to focus on controlling your diabetes during the weeks before surgery. . Make sure that the doctor who takes care of your diabetes knows about your planned surgery including the date and location.  How do I manage my blood sugar before surgery? . Check your blood sugar at least 4 times a day, starting 2 days  before surgery, to make sure that the level is not too high or low. o Check your blood sugar the morning of your surgery when you wake up and every 2 hours until you get to the Short Stay unit. . If your blood sugar is less than 70 mg/dL, you will need to treat for low blood sugar: o Do not take insulin. o Treat a low blood sugar (less than 70 mg/dL) with  cup of clear juice (cranberry or apple), 4 glucose tablets, OR glucose gel. o Recheck blood sugar in 15 minutes after treatment (to make sure it is greater than 70 mg/dL). If your blood sugar is not greater than 70 mg/dL on recheck, call [phone] for further instructions. . Report your blood sugar to the short stay nurse when you get to Short Stay.  . If you are admitted to the hospital after surgery: o Your blood sugar will be checked by the staff and you will probably be given insulin after surgery (instead of oral diabetes medicines) to make sure you have good blood sugar levels. o The goal for blood sugar control after surgery is 80-180 mg/dL.                 Do not wear jewelry, make-up or nail polish.  Do not wear lotions, powders, or perfumes, or deoderant.  Do not shave 48 hours prior to surgery.  Men may shave face and neck.  Do not bring valuables to the hospital.  [facility] is  not responsible for any belongings or valuables.  Contacts, dentures or bridgework may not be worn into surgery.  Leave your suitcase in the car.  After surgery it may be brought to your room.  For patients admitted to the hospital, discharge time will be determined by your treatment team.  Patients discharged the day of surgery will not be allowed to drive home.    Special instructions:    [facility]- Preparing For Surgery  Before surgery, you can play an important role. Because skin is not sterile, your skin needs to be as free of germs as possible. You can reduce the number of germs on your skin by washing with CHG  (chlorahexidine gluconate) Soap before surgery.  CHG is an antiseptic cleaner which kills germs and bonds with the skin to continue killing germs even after washing.  Please do not use if you have an allergy to CHG or antibacterial soaps. If your skin becomes reddened/irritated stop using the CHG.  Do not shave (including legs and underarms) for at least 48 hours prior to first CHG shower. It is OK to shave your face.  Please follow these instructions carefully.   1. Shower the NIGHT BEFORE SURGERY and the MORNING OF SURGERY with CHG.   2. If you chose to wash your hair, wash your hair first as usual with your normal shampoo.  3. After you shampoo, rinse your hair and body thoroughly to remove the shampoo.  4. Use CHG as you would any other liquid soap. You can apply CHG directly to the skin and wash gently with a scrungie or a clean washcloth.   5. Apply the CHG Soap to your body ONLY FROM THE NECK DOWN.  Do not use on open wounds or open sores. Avoid contact with your eyes, ears, mouth and genitals (private parts). Wash genitals (private parts) with your normal soap.  6. Wash thoroughly, paying special attention to the area where your surgery will be performed.  7. Thoroughly rinse your body with warm water from the neck down.  8. DO NOT shower/wash with your normal soap after using and rinsing off the CHG Soap.  9. Pat yourself dry with a CLEAN TOWEL.   10. Wear CLEAN PAJAMAS   11. Place CLEAN SHEETS on your bed the night of your first shower and DO NOT SLEEP WITH PETS.    Day of Surgery: Do not apply any deodorants/lotions. Please wear clean clothes to the hospital/surgery center.      Please read over the following fact sheets that you were given. Coughing and Deep Breathing, MRSA Information and Surgical Site Infection Prevention

## 2017-02-22 ENCOUNTER — Ambulatory Visit (HOSPITAL_COMMUNITY)
Admission: RE | Admit: 2017-02-22 | Discharge: 2017-02-22 | Disposition: A | Discharge status: Home / Self Care | Payer: Federal, State, Local not specified - PPO | Source: Ambulatory Visit | Attending: Thoracic Surgery (Cardiothoracic Vascular Surgery) | Admitting: Thoracic Surgery (Cardiothoracic Vascular Surgery)

## 2017-02-22 ENCOUNTER — Encounter (HOSPITAL_COMMUNITY): Payer: Self-pay

## 2017-02-22 ENCOUNTER — Encounter (HOSPITAL_COMMUNITY)
Admission: RE | Admit: 2017-02-22 | Discharge: 2017-02-22 | Disposition: A | Discharge status: Home / Self Care | Payer: Federal, State, Local not specified - PPO | Source: Ambulatory Visit | Attending: Thoracic Surgery (Cardiothoracic Vascular Surgery) | Admitting: Thoracic Surgery (Cardiothoracic Vascular Surgery)

## 2017-02-22 DIAGNOSIS — R918 Other nonspecific abnormal finding of lung field: Secondary | ICD-10-CM

## 2017-02-22 DIAGNOSIS — Z01818 Encounter for other preprocedural examination: Secondary | ICD-10-CM | POA: Diagnosis not present

## 2017-02-22 DIAGNOSIS — Z01812 Encounter for preprocedural laboratory examination: Secondary | ICD-10-CM | POA: Insufficient documentation

## 2017-02-22 DIAGNOSIS — R911 Solitary pulmonary nodule: Secondary | ICD-10-CM | POA: Diagnosis not present

## 2017-02-22 LAB — COMPREHENSIVE METABOLIC PANEL
ALBUMIN: 3.9 g/dL (ref 3.5–5.0)
ALK PHOS: 71 U/L (ref 38–126)
ALT: 23 U/L (ref 14–54)
ANION GAP: 11 (ref 5–15)
AST: 25 U/L (ref 15–41)
BILIRUBIN TOTAL: 0.3 mg/dL (ref 0.3–1.2)
BUN: 8 mg/dL (ref 6–20)
CALCIUM: 9.7 mg/dL (ref 8.9–10.3)
CO2: 29 mmol/L (ref 22–32)
CREATININE: 0.92 mg/dL (ref 0.44–1.00)
Chloride: 101 mmol/L (ref 101–111)
GFR calc Af Amer: >60 mL/min (ref >60)
GFR calc non Af Amer: >60 mL/min (ref >60)
GLUCOSE: 146 mg/dL — AB (ref 65–99)
Potassium: 3 mmol/L — ABNORMAL LOW (ref 3.5–5.1)
SODIUM: 141 mmol/L (ref 135–145)
TOTAL PROTEIN: 7.6 g/dL (ref 6.5–8.1)

## 2017-02-22 LAB — URINALYSIS, ROUTINE W REFLEX MICROSCOPIC
BILIRUBIN URINE: NEGATIVE
Glucose, UA: NEGATIVE mg/dL
Hgb urine dipstick: NEGATIVE
Ketones, ur: NEGATIVE mg/dL
LEUKOCYTES UA: NEGATIVE
NITRITE: NEGATIVE
PH: 5 (ref 5.0–8.0)
Protein, ur: NEGATIVE mg/dL
Specific Gravity, Urine: 1.017 (ref 1.005–1.030)

## 2017-02-22 LAB — CBC
HEMATOCRIT: 43.5 % (ref 36.0–46.0)
HEMOGLOBIN: 14.2 g/dL (ref 12.0–15.0)
MCH: 28.9 pg (ref 26.0–34.0)
MCHC: 32.6 g/dL (ref 30.0–36.0)
MCV: 88.4 fL (ref 78.0–100.0)
Platelets: 313 10*3/uL (ref 150–400)
RBC: 4.92 MIL/uL (ref 3.87–5.11)
RDW: 16.5 % — ABNORMAL HIGH (ref 11.5–15.5)
WBC: 13 10*3/uL — ABNORMAL HIGH (ref 4.0–10.5)

## 2017-02-22 LAB — GLUCOSE, CAPILLARY: Glucose-Capillary: 146 mg/dL — ABNORMAL HIGH (ref 65–99)

## 2017-02-22 LAB — APTT: APTT: 28 s (ref 24–36)

## 2017-02-22 LAB — PROTIME-INR
INR: 0.92
Prothrombin Time: 12.2 seconds (ref 11.4–15.2)

## 2017-02-22 NOTE — Progress Notes (Addendum)
PCP - pt states she goes to the [place] in [facility] - none, pt denies cardiac hx, previous cardiologist Dr. Gwenlyn Found per chart Pt sees Dr. Baird Cancer for retinology Dr. Donnetta Hutching- vascular Neuro- Dr. Barrie Dunker  Chest x-ray - 02/22/2017  EKG - 02/02/2017  ECHO - 04/25/14  Sleep Study - "a long long time ago", pt has BIPAP but does not wear  Fasting Blood Sugar - 100-150s, last A1c 7.0 on 02/02/17  Patient denies shortness of breath, fever, cough and chest pain at PAT appointment  Patient verbalized understanding of instructions that were given to them at the PAT appointment. Patient was also instructed that they will need to review over the PAT instructions again at home before surgery.  Pt refused blood gas at PAT appointment, to draw DOS

## 2017-02-23 NOTE — Progress Notes (Signed)
Anesthesia Follow-up: Patient is a 56 year old female scheduled for left VATS/wedge resection, possible lobectomy on 02/24/17 by Dr. Modesto Charon. Procedure was initially scheduled for 02/06/17, but was postponed until she could undergo right inguinal lymph node needle biopsy. Pathology showed reactive lymphoid hyperplasia with tissue flow cytometry showing no monoclonal B cell population identified and T cells with nonspecific changes.  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 BMI is consistent with obesity. She underwent a bilateral vulvar biopsy on 02/01/17 by Dr. Everitt Amber that showed non-specific dermal inflammation on the left and a fibroepithelial polyp on the right. Both biopsies were negative for dysplasia or malignancy. She also had finding of left main and 3V CAD on PET, but this study is limited as it cannot tell the severity of disease or if there is any coronary stenosis. Dr. Roxan Hockey noted findings on PET and wrote, "She does have evidence of coronary atherosclerosis on PET, but does not have any symptoms suspicious for angina." He rated her Zubrod Score as 0: Normal activity, no symptoms.   No PCP is listed. She goes to the [facility]. Neurologist is Dr. Rosalin Hawking. Vascular surgeon is Dr. Sherren Mocha Early. Cardiologist is Dr. Quay Burow, only seen on 04/26/16 for PAD.   Medications include ASA 325 mg, dexamethasone (intravitreal), Trosopt ophthalmic, HCTZ, metformin, Protonix, Seroquel, Crestor, Effexor XR, timolol ophthalmic.  BP 128/69   Pulse 92   Temp 36.8 C (Oral)   Resp 18   Ht 5\' 4"  (1.626 m)   Wt 219 lb (99.3 kg)   LMP 11/25/2011   SpO2 99%   BMI 37.59 kg/m   EKG 02/02/17: NSR, possible LAE, incomplete RBBB, non-specific T wave abnormality. QT 422/QTc 458 ms.  Echo 04/25/14: Study Conclusions - Left  ventricle: The cavity size was normal. Wall thickness was increased in a pattern of mild LVH. Systolic function was vigorous. The estimated ejection fraction was in the range of 65% to 70%. Wall motion was normal; there were no regional wall motion abnormalities.  Carotid U/S 12/07/16: Impression:  Doppler velocities in the RICA are consistent with 1-39% stenosis. Velocities in the LICA are consistent with 60-79% stenosis (high end of range).  CTA head/neck 12/12/16: IMPRESSION: 1. New left upper lobe 16 mm lung nodule since November 2015 is highly suspicious for bronchogenic carcinoma. Recommend thoracic surgery consultation. CT Chest (IV contrast preferred) versus PET-CT would be most appropriate for further imaging evaluation. 2. Proximal left ICA atherosclerosis with some progression since 2015. Up to 60% stenosis at the distal left ICA bulb level now. 3. Stable bilateral ICA siphon calcified atherosclerosis without significant intracranial arterial stenosis. 4. Chronic cervical spine degeneration, with suspected multilevel degenerative cervical spinal stenosis. 5. Normal CT appearance of the brain.  PET scan 01/06/17: IMPRESSION: 1. 1.7 cm left upper lobe pulmonary nodule is hypermetabolic highly concerning for neoplasm. Whether this represents a primary bronchogenic carcinoma or a solitary pulmonary metastasis is uncertain. No hypermetabolic mediastinal or hilar lymphadenopathy noted. 2. Sessile skin lesion in the right vulvar region which demonstrates hypermetabolism with bilateral inguinal lymphadenopathy. Findings are concerning for potential vulvar carcinoma and clinical correlation is recommended. 3. Multiple small areas of low-level hypermetabolism in the breasts bilaterally with mildly hypermetabolic lymph node in the axillary tail of the right breast. These findings are nonspecific if, but correlation with mammography is recommended. 4. Rather diffuse  hypermetabolism throughout Waldeyer's ring, most focal in the  midline of the adenoids in the posterior nasopharynx. This is favored to be physiologic, as there is no definite pathologic correlate noted in these regions on recent contrast enhanced neck CT. 5. Aortic atherosclerosis, in addition to left main and 3 vessel coronary artery disease. Please note that although the presence of coronary artery calcium documents the presence of coronary artery disease, the severity of this disease and any potential stenosis cannot be assessed on this non-gated CT examination. Assessment for potential risk factor modification, dietary therapy or pharmacologic therapy may be warranted, if clinically indicated. 6. Mild cardiomegaly. 7. Fibroid uterus.  CXR 02/22/17: IMPRESSION: 1. No acute finding. 2. Known left upper lobe nodule.  PFTs 02/02/17: FVC 2.78 (100%), FEV1 2.22 (100%), DLCO unc 14.12 (58%).   Preoperative labs noted.  K 3.0. Cr 0.92. WBC 13.0. H/H 14.2/43.5. PT/PTT WNL. A1c on 02/02/17 was 7.0. T&S done. UA WNL. Interferon Gamma Release Assay negative on 01/16/17. She refused ABG at PAT, so this will need to be done on the day of surgery. K and WBC results routed to [place].   I had previously reviewed above with anesthesiologists Dr. Sabra Heck and Dr. Orene Desanctis (see my note fro 02/03/17) . If patient remains asymptomatic from a CV standpoint then it is anticipated that she can proceed as planned.  [facility] Short Stay Center/Anesthesiology Phone [phone] 02/23/2017 11:46 AM

## 2017-02-24 ENCOUNTER — Inpatient Hospital Stay (HOSPITAL_COMMUNITY): Payer: Federal, State, Local not specified - PPO | Admitting: Vascular Surgery

## 2017-02-24 ENCOUNTER — Inpatient Hospital Stay (HOSPITAL_COMMUNITY)
Admission: RE | Admit: 2017-02-24 | Discharge: 2017-02-28 | DRG: 164 | Disposition: A | Discharge status: Home / Self Care | Payer: Federal, State, Local not specified - PPO | Source: Ambulatory Visit | Attending: Thoracic Surgery (Cardiothoracic Vascular Surgery) | Admitting: Thoracic Surgery (Cardiothoracic Vascular Surgery)

## 2017-02-24 ENCOUNTER — Inpatient Hospital Stay (HOSPITAL_COMMUNITY): Payer: Federal, State, Local not specified - PPO

## 2017-02-24 ENCOUNTER — Encounter (HOSPITAL_COMMUNITY)
Admission: RE | Disposition: A | Discharge status: Home / Self Care | Payer: Self-pay | Source: Ambulatory Visit | Attending: Thoracic Surgery (Cardiothoracic Vascular Surgery)

## 2017-02-24 ENCOUNTER — Other Ambulatory Visit: Payer: Self-pay | Admitting: *Deleted

## 2017-02-24 ENCOUNTER — Inpatient Hospital Stay (HOSPITAL_COMMUNITY): Payer: Federal, State, Local not specified - PPO | Admitting: Certified Registered Nurse Anesthetist

## 2017-02-24 ENCOUNTER — Encounter (HOSPITAL_COMMUNITY): Payer: Self-pay | Admitting: Surgery

## 2017-02-24 DIAGNOSIS — Z79811 Long term (current) use of aromatase inhibitors: Secondary | ICD-10-CM

## 2017-02-24 DIAGNOSIS — F17290 Nicotine dependence, other tobacco product, uncomplicated: Secondary | ICD-10-CM | POA: Diagnosis not present

## 2017-02-24 DIAGNOSIS — D259 Leiomyoma of uterus, unspecified: Secondary | ICD-10-CM | POA: Diagnosis not present

## 2017-02-24 DIAGNOSIS — Z79899 Other long term (current) drug therapy: Secondary | ICD-10-CM

## 2017-02-24 DIAGNOSIS — C3412 Malignant neoplasm of upper lobe, left bronchus or lung: Principal | ICD-10-CM | POA: Diagnosis present

## 2017-02-24 DIAGNOSIS — Z8673 Personal history of transient ischemic attack (TIA), and cerebral infarction without residual deficits: Secondary | ICD-10-CM

## 2017-02-24 DIAGNOSIS — I517 Cardiomegaly: Secondary | ICD-10-CM | POA: Diagnosis not present

## 2017-02-24 DIAGNOSIS — Z91013 Allergy to seafood: Secondary | ICD-10-CM

## 2017-02-24 DIAGNOSIS — J9811 Atelectasis: Secondary | ICD-10-CM | POA: Diagnosis present

## 2017-02-24 DIAGNOSIS — F17210 Nicotine dependence, cigarettes, uncomplicated: Secondary | ICD-10-CM | POA: Diagnosis not present

## 2017-02-24 DIAGNOSIS — I2510 Atherosclerotic heart disease of native coronary artery without angina pectoris: Secondary | ICD-10-CM | POA: Diagnosis not present

## 2017-02-24 DIAGNOSIS — I700 Atherosclerosis of aorta: Secondary | ICD-10-CM | POA: Diagnosis present

## 2017-02-24 DIAGNOSIS — J90 Pleural effusion, not elsewhere classified: Secondary | ICD-10-CM | POA: Diagnosis not present

## 2017-02-24 DIAGNOSIS — I119 Hypertensive heart disease without heart failure: Secondary | ICD-10-CM | POA: Diagnosis not present

## 2017-02-24 DIAGNOSIS — Z902 Acquired absence of lung [part of]: Secondary | ICD-10-CM

## 2017-02-24 DIAGNOSIS — Z09 Encounter for follow-up examination after completed treatment for conditions other than malignant neoplasm: Secondary | ICD-10-CM

## 2017-02-24 DIAGNOSIS — Z6835 Body mass index (BMI) 35.0-35.9, adult: Secondary | ICD-10-CM

## 2017-02-24 DIAGNOSIS — E119 Type 2 diabetes mellitus without complications: Secondary | ICD-10-CM | POA: Diagnosis present

## 2017-02-24 DIAGNOSIS — J939 Pneumothorax, unspecified: Secondary | ICD-10-CM | POA: Diagnosis not present

## 2017-02-24 DIAGNOSIS — E669 Obesity, unspecified: Secondary | ICD-10-CM | POA: Diagnosis not present

## 2017-02-24 DIAGNOSIS — G4730 Sleep apnea, unspecified: Secondary | ICD-10-CM | POA: Diagnosis present

## 2017-02-24 DIAGNOSIS — J45909 Unspecified asthma, uncomplicated: Secondary | ICD-10-CM | POA: Diagnosis not present

## 2017-02-24 DIAGNOSIS — G4733 Obstructive sleep apnea (adult) (pediatric): Secondary | ICD-10-CM | POA: Diagnosis not present

## 2017-02-24 DIAGNOSIS — E785 Hyperlipidemia, unspecified: Secondary | ICD-10-CM | POA: Diagnosis present

## 2017-02-24 DIAGNOSIS — R918 Other nonspecific abnormal finding of lung field: Secondary | ICD-10-CM

## 2017-02-24 DIAGNOSIS — E876 Hypokalemia: Secondary | ICD-10-CM | POA: Diagnosis not present

## 2017-02-24 DIAGNOSIS — Z4682 Encounter for fitting and adjustment of non-vascular catheter: Secondary | ICD-10-CM

## 2017-02-24 DIAGNOSIS — I10 Essential (primary) hypertension: Secondary | ICD-10-CM | POA: Diagnosis not present

## 2017-02-24 DIAGNOSIS — I889 Nonspecific lymphadenitis, unspecified: Secondary | ICD-10-CM | POA: Diagnosis not present

## 2017-02-24 HISTORY — PX: LOBECTOMY: SHX5089

## 2017-02-24 HISTORY — PX: VIDEO ASSISTED THORACOSCOPY (VATS)/WEDGE RESECTION: SHX6174

## 2017-02-24 HISTORY — PX: NODE DISSECTION: SHX5269

## 2017-02-24 LAB — POCT I-STAT 7, (LYTES, BLD GAS, ICA,H+H)
Acid-Base Excess: 4 mmol/L — ABNORMAL HIGH (ref 0.0–2.0)
Bicarbonate: 30 mmol/L — ABNORMAL HIGH (ref 20.0–28.0)
Calcium, Ion: 1.22 mmol/L (ref 1.15–1.40)
HEMATOCRIT: 39 % (ref 36.0–46.0)
Hemoglobin: 13.3 g/dL (ref 12.0–15.0)
O2 Saturation: 95 %
PH ART: 7.402 (ref 7.350–7.450)
POTASSIUM: 3.3 mmol/L — AB (ref 3.5–5.1)
SODIUM: 144 mmol/L (ref 135–145)
TCO2: 32 mmol/L (ref 22–32)
pCO2 arterial: 47.7 mmHg (ref 32.0–48.0)
pO2, Arterial: 74 mmHg — ABNORMAL LOW (ref 83.0–108.0)

## 2017-02-24 LAB — GLUCOSE, CAPILLARY
GLUCOSE-CAPILLARY: 160 mg/dL — AB (ref 65–99)
GLUCOSE-CAPILLARY: 159 mg/dL — AB (ref 65–99)
Glucose-Capillary: 151 mg/dL — ABNORMAL HIGH (ref 65–99)

## 2017-02-24 LAB — PREPARE RBC (CROSSMATCH)

## 2017-02-24 SURGERY — VIDEO ASSISTED THORACOSCOPY (VATS)/WEDGE RESECTION
Anesthesia: General | Site: Chest | Laterality: Left

## 2017-02-24 MED ORDER — FENTANYL CITRATE (PF) 250 MCG/5ML IJ SOLN
INTRAMUSCULAR | Status: AC
  Filled 2017-02-24: qty 5

## 2017-02-24 MED ORDER — BUPIVACAINE 0.5 % ON-Q PUMP SINGLE CATH 400 ML
400.0000 mL | INJECTION | Status: DC
  Filled 2017-02-24: qty 400

## 2017-02-24 MED ORDER — KETOROLAC TROMETHAMINE 15 MG/ML IJ SOLN
15.0000 mg | Freq: Four times a day (QID) | INTRAMUSCULAR | Status: AC
  Administered 2017-02-24 – 2017-02-26 (×7): 15 mg via INTRAVENOUS
  Filled 2017-02-24 (×8): qty 1

## 2017-02-24 MED ORDER — MIDAZOLAM HCL 2 MG/2ML IJ SOLN
INTRAMUSCULAR | Status: AC
  Filled 2017-02-24: qty 2

## 2017-02-24 MED ORDER — MEPERIDINE HCL 25 MG/ML IJ SOLN: 6.2500 mg | INTRAMUSCULAR | Status: DC | PRN

## 2017-02-24 MED ORDER — ROSUVASTATIN CALCIUM 10 MG PO TABS
20.0000 mg | ORAL_TABLET | Freq: Every evening | ORAL | Status: DC
  Administered 2017-02-25 – 2017-02-27 (×3): 20 mg via ORAL
  Filled 2017-02-24: qty 2
  Filled 2017-02-24: qty 1
  Filled 2017-02-24: qty 2

## 2017-02-24 MED ORDER — FENTANYL CITRATE (PF) 100 MCG/2ML IJ SOLN
25.0000 ug | INTRAMUSCULAR | Status: DC | PRN
  Administered 2017-02-24 (×4): 25 ug via INTRAVENOUS

## 2017-02-24 MED ORDER — PROMETHAZINE HCL 25 MG/ML IJ SOLN: 6.2500 mg | INTRAMUSCULAR | Status: DC | PRN

## 2017-02-24 MED ORDER — CEFUROXIME SODIUM 750 MG IJ SOLR
INTRAMUSCULAR | Status: AC
  Filled 2017-02-24: qty 1500

## 2017-02-24 MED ORDER — FENTANYL CITRATE (PF) 100 MCG/2ML IJ SOLN
INTRAMUSCULAR | Status: AC
Start: 2017-02-24 — End: 2017-02-25
  Filled 2017-02-24: qty 2

## 2017-02-24 MED ORDER — SODIUM CHLORIDE 0.9% FLUSH
3.0000 mL | Freq: Two times a day (BID) | INTRAVENOUS | Status: DC
  Administered 2017-02-24 – 2017-02-26 (×3): 3 mL via INTRAVENOUS

## 2017-02-24 MED ORDER — FENTANYL 40 MCG/ML IV SOLN
INTRAVENOUS | Status: DC
  Administered 2017-02-24: 1000 ug via INTRAVENOUS
  Administered 2017-02-24: 120 ug via INTRAVENOUS
  Administered 2017-02-25: 60 ug via INTRAVENOUS
  Administered 2017-02-25: 150 ug via INTRAVENOUS
  Administered 2017-02-25: 225 ug via INTRAVENOUS
  Administered 2017-02-25: 1000 ug via INTRAVENOUS
  Administered 2017-02-25: 30 ug via INTRAVENOUS
  Administered 2017-02-25: 130 ug via INTRAVENOUS
  Administered 2017-02-25: 195 ug via INTRAVENOUS
  Administered 2017-02-26: 165 ug via INTRAVENOUS
  Administered 2017-02-26: 30.75 mL via INTRAVENOUS
  Administered 2017-02-26: 75 ug via INTRAVENOUS
  Administered 2017-02-26: 150 ug via INTRAVENOUS
  Administered 2017-02-26: 90 ug via INTRAVENOUS
  Administered 2017-02-26: 30.75 mL via INTRAVENOUS
  Administered 2017-02-27: 15 ug via INTRAVENOUS
  Administered 2017-02-27: 75 ug via INTRAVENOUS
  Administered 2017-02-27: 0 ug via INTRAVENOUS
  Filled 2017-02-24 (×2): qty 25

## 2017-02-24 MED ORDER — ONDANSETRON HCL 4 MG/2ML IJ SOLN
INTRAMUSCULAR | Status: DC | PRN
  Administered 2017-02-24: 4 mg via INTRAVENOUS

## 2017-02-24 MED ORDER — ACETAMINOPHEN 500 MG PO TABS
1000.0000 mg | ORAL_TABLET | Freq: Four times a day (QID) | ORAL | Status: DC
  Administered 2017-02-24 – 2017-02-27 (×6): 1000 mg via ORAL
  Filled 2017-02-24 (×6): qty 2

## 2017-02-24 MED ORDER — BUPIVACAINE HCL (PF) 0.5 % IJ SOLN
INTRAMUSCULAR | Status: DC | PRN
  Administered 2017-02-24: 5 mL

## 2017-02-24 MED ORDER — LACTATED RINGERS IV SOLN: INTRAVENOUS | Status: DC

## 2017-02-24 MED ORDER — ACETAMINOPHEN 160 MG/5ML PO SOLN
1000.0000 mg | Freq: Four times a day (QID) | ORAL | Status: DC
  Administered 2017-02-25 – 2017-02-27 (×4): 1000 mg via ORAL
  Filled 2017-02-24 (×5): qty 40.6

## 2017-02-24 MED ORDER — BUPIVACAINE HCL (PF) 0.5 % IJ SOLN
INTRAMUSCULAR | Status: AC
  Filled 2017-02-24: qty 30

## 2017-02-24 MED ORDER — SODIUM CHLORIDE 0.9 % IV SOLN
10.0000 mL/h | Freq: Once | INTRAVENOUS | Status: DC
Start: 2017-02-24 — End: 2017-02-24

## 2017-02-24 MED ORDER — ALBUTEROL SULFATE (2.5 MG/3ML) 0.083% IN NEBU
2.5000 mg | INHALATION_SOLUTION | RESPIRATORY_TRACT | Status: DC
  Administered 2017-02-24 – 2017-02-26 (×8): 2.5 mg via RESPIRATORY_TRACT
  Filled 2017-02-24 (×8): qty 3

## 2017-02-24 MED ORDER — PROPOFOL 10 MG/ML IV BOLUS
INTRAVENOUS | Status: AC
  Filled 2017-02-24: qty 20

## 2017-02-24 MED ORDER — PANTOPRAZOLE SODIUM 40 MG PO TBEC
80.0000 mg | DELAYED_RELEASE_TABLET | Freq: Every day | ORAL | Status: DC
  Administered 2017-02-25 – 2017-02-28 (×4): 80 mg via ORAL
  Filled 2017-02-24 (×4): qty 2

## 2017-02-24 MED ORDER — TIMOLOL MALEATE 0.5 % OP SOLN
1.0000 [drp] | Freq: Two times a day (BID) | OPHTHALMIC | Status: DC
  Administered 2017-02-25 – 2017-02-28 (×8): 1 [drp] via OPHTHALMIC
  Filled 2017-02-24: qty 5

## 2017-02-24 MED ORDER — DORZOLAMIDE HCL 2 % OP SOLN
1.0000 [drp] | Freq: Two times a day (BID) | OPHTHALMIC | Status: DC
  Administered 2017-02-25 – 2017-02-28 (×8): 1 [drp] via OPHTHALMIC
  Filled 2017-02-24: qty 10

## 2017-02-24 MED ORDER — 0.9 % SODIUM CHLORIDE (POUR BTL) OPTIME
TOPICAL | Status: DC | PRN
  Administered 2017-02-24: 3000 mL

## 2017-02-24 MED ORDER — SODIUM CHLORIDE 0.9% FLUSH: 10.0000 mL | INTRAVENOUS | Status: DC | PRN

## 2017-02-24 MED ORDER — FENTANYL CITRATE (PF) 250 MCG/5ML IJ SOLN
INTRAMUSCULAR | Status: DC | PRN
  Administered 2017-02-24: 50 ug via INTRAVENOUS
  Administered 2017-02-24: 125 ug via INTRAVENOUS
  Administered 2017-02-24: 25 ug via INTRAVENOUS
  Administered 2017-02-24 (×3): 50 ug via INTRAVENOUS

## 2017-02-24 MED ORDER — HEMOSTATIC AGENTS (NO CHARGE) OPTIME
TOPICAL | Status: DC | PRN
  Administered 2017-02-24: 1 via TOPICAL

## 2017-02-24 MED ORDER — SUGAMMADEX SODIUM 200 MG/2ML IV SOLN
INTRAVENOUS | Status: DC | PRN
  Administered 2017-02-24: 200 mg via INTRAVENOUS

## 2017-02-24 MED ORDER — PNEUMOCOCCAL VAC POLYVALENT 25 MCG/0.5ML IJ INJ: 0.5000 mL | INJECTION | INTRAMUSCULAR | Status: DC | PRN

## 2017-02-24 MED ORDER — BISACODYL 5 MG PO TBEC
10.0000 mg | DELAYED_RELEASE_TABLET | Freq: Every day | ORAL | Status: DC
  Administered 2017-02-25 – 2017-02-28 (×4): 10 mg via ORAL
  Filled 2017-02-24 (×5): qty 2

## 2017-02-24 MED ORDER — POTASSIUM CHLORIDE 10 MEQ/50ML IV SOLN
10.0000 meq | Freq: Every day | INTRAVENOUS | Status: DC | PRN
Start: 2017-02-24 — End: 2017-02-25

## 2017-02-24 MED ORDER — SENNOSIDES-DOCUSATE SODIUM 8.6-50 MG PO TABS
1.0000 | ORAL_TABLET | Freq: Every day | ORAL | Status: DC
  Administered 2017-02-24 – 2017-02-27 (×4): 1 via ORAL
  Filled 2017-02-24 (×4): qty 1

## 2017-02-24 MED ORDER — LIDOCAINE 2% (20 MG/ML) 5 ML SYRINGE
INTRAMUSCULAR | Status: DC | PRN
  Administered 2017-02-24: 40 mg via INTRAVENOUS

## 2017-02-24 MED ORDER — DEXTROSE 5 % IV SOLN
1.5000 g | Freq: Two times a day (BID) | INTRAVENOUS | Status: AC
  Administered 2017-02-24 – 2017-02-25 (×2): 1.5 g via INTRAVENOUS
  Filled 2017-02-24 (×2): qty 1.5

## 2017-02-24 MED ORDER — DEXTROSE 5 % IV SOLN
1.5000 g | INTRAVENOUS | Status: AC
  Administered 2017-02-24 (×2): 1.5 g via INTRAVENOUS
  Filled 2017-02-24: qty 1.5

## 2017-02-24 MED ORDER — ASPIRIN EC 325 MG PO TBEC
325.0000 mg | DELAYED_RELEASE_TABLET | Freq: Every day | ORAL | Status: DC
  Administered 2017-02-25 – 2017-02-28 (×4): 325 mg via ORAL
  Filled 2017-02-24 (×4): qty 1

## 2017-02-24 MED ORDER — SODIUM CHLORIDE 0.9% FLUSH
10.0000 mL | Freq: Two times a day (BID) | INTRAVENOUS | Status: DC
  Administered 2017-02-24 – 2017-02-27 (×5): 10 mL via INTRAVENOUS

## 2017-02-24 MED ORDER — PHENYLEPHRINE 40 MCG/ML (10ML) SYRINGE FOR IV PUSH (FOR BLOOD PRESSURE SUPPORT)
PREFILLED_SYRINGE | INTRAVENOUS | Status: DC | PRN
  Administered 2017-02-24 (×2): 40 ug via INTRAVENOUS
  Administered 2017-02-24 (×2): 80 ug via INTRAVENOUS

## 2017-02-24 MED ORDER — MIDAZOLAM HCL 2 MG/2ML IJ SOLN
INTRAMUSCULAR | Status: DC | PRN
  Administered 2017-02-24: 2 mg via INTRAVENOUS

## 2017-02-24 MED ORDER — PROPOFOL 10 MG/ML IV BOLUS
INTRAVENOUS | Status: DC | PRN
  Administered 2017-02-24: 200 mg via INTRAVENOUS

## 2017-02-24 MED ORDER — VENLAFAXINE HCL ER 37.5 MG PO CP24
187.5000 mg | ORAL_CAPSULE | Freq: Every day | ORAL | Status: DC
  Administered 2017-02-25 – 2017-02-27 (×3): 187.5 mg via ORAL
  Filled 2017-02-24 (×4): qty 1

## 2017-02-24 MED ORDER — DEXTROSE-NACL 5-0.9 % IV SOLN
INTRAVENOUS | Status: DC
  Administered 2017-02-24 – 2017-02-25 (×2): 125 mL/h via INTRAVENOUS

## 2017-02-24 MED ORDER — EPHEDRINE SULFATE-NACL 50-0.9 MG/10ML-% IV SOSY
PREFILLED_SYRINGE | INTRAVENOUS | Status: DC | PRN
  Administered 2017-02-24: 10 mg via INTRAVENOUS
  Administered 2017-02-24: 5 mg via INTRAVENOUS

## 2017-02-24 MED ORDER — DIPHENHYDRAMINE HCL 50 MG/ML IJ SOLN: 12.5000 mg | Freq: Four times a day (QID) | INTRAMUSCULAR | Status: DC | PRN

## 2017-02-24 MED ORDER — LACTATED RINGERS IV SOLN
INTRAVENOUS | Status: DC
  Administered 2017-02-24: 10:00:00 via INTRAVENOUS

## 2017-02-24 MED ORDER — SODIUM CHLORIDE 0.9% FLUSH: 9.0000 mL | INTRAVENOUS | Status: DC | PRN

## 2017-02-24 MED ORDER — NALOXONE HCL 0.4 MG/ML IJ SOLN: 0.4000 mg | INTRAMUSCULAR | Status: DC | PRN

## 2017-02-24 MED ORDER — OXYCODONE HCL 5 MG PO TABS
5.0000 mg | ORAL_TABLET | ORAL | Status: DC | PRN
  Administered 2017-02-27 (×2): 10 mg via ORAL
  Filled 2017-02-24 (×2): qty 2

## 2017-02-24 MED ORDER — TRAMADOL HCL 50 MG PO TABS
50.0000 mg | ORAL_TABLET | Freq: Four times a day (QID) | ORAL | Status: DC | PRN
  Administered 2017-02-28: 100 mg via ORAL
  Filled 2017-02-24: qty 2

## 2017-02-24 MED ORDER — BUPIVACAINE 0.5 % ON-Q PUMP SINGLE CATH 400 ML
INJECTION | Status: AC | PRN
  Administered 2017-02-24: 400 mL

## 2017-02-24 MED ORDER — LUNG SURGERY BOOK
Freq: Once | Status: AC
  Administered 2017-02-24: 23:00:00
  Filled 2017-02-24: qty 1

## 2017-02-24 MED ORDER — ROCURONIUM BROMIDE 10 MG/ML (PF) SYRINGE
PREFILLED_SYRINGE | INTRAVENOUS | Status: DC | PRN
  Administered 2017-02-24 (×2): 20 mg via INTRAVENOUS
  Administered 2017-02-24 (×2): 10 mg via INTRAVENOUS
  Administered 2017-02-24: 20 mg via INTRAVENOUS
  Administered 2017-02-24: 30 mg via INTRAVENOUS
  Administered 2017-02-24 (×2): 10 mg via INTRAVENOUS
  Administered 2017-02-24: 60 mg via INTRAVENOUS

## 2017-02-24 MED ORDER — ONDANSETRON HCL 4 MG/2ML IJ SOLN: 4.0000 mg | Freq: Four times a day (QID) | INTRAMUSCULAR | Status: DC | PRN

## 2017-02-24 MED ORDER — DIPHENHYDRAMINE HCL 12.5 MG/5ML PO ELIX
12.5000 mg | ORAL_SOLUTION | Freq: Four times a day (QID) | ORAL | Status: DC | PRN
  Filled 2017-02-24: qty 5

## 2017-02-24 MED ORDER — SODIUM CHLORIDE 0.9% FLUSH: 3.0000 mL | INTRAVENOUS | Status: DC | PRN

## 2017-02-24 MED ORDER — HYDROCHLOROTHIAZIDE 25 MG PO TABS
25.0000 mg | ORAL_TABLET | Freq: Every day | ORAL | Status: DC
  Administered 2017-02-25 – 2017-02-28 (×4): 25 mg via ORAL
  Filled 2017-02-24 (×4): qty 1

## 2017-02-24 MED ORDER — LACTATED RINGERS IV SOLN
INTRAVENOUS | Status: DC | PRN
  Administered 2017-02-24 (×2): via INTRAVENOUS

## 2017-02-24 SURGICAL SUPPLY — 93 items
BENZOIN TINCTURE PRP APPL 2/3 (GAUZE/BANDAGES/DRESSINGS) ×2 IMPLANT
CANISTER SUCT 3000ML PPV (MISCELLANEOUS) ×4 IMPLANT
CATH KIT ON Q 5IN SLV (PAIN MANAGEMENT) ×2 IMPLANT
CATH THORACIC 28FR (CATHETERS) ×2 IMPLANT
CATH THORACIC 36FR (CATHETERS) IMPLANT
CATH THORACIC 36FR RT ANG (CATHETERS) IMPLANT
CLIP VESOCCLUDE MED 6/CT (CLIP) ×2 IMPLANT
CONN ST 1/4X3/8  BEN (MISCELLANEOUS)
CONN ST 1/4X3/8 BEN (MISCELLANEOUS) IMPLANT
CONN Y 3/8X3/8X3/8  BEN (MISCELLANEOUS)
CONN Y 3/8X3/8X3/8 BEN (MISCELLANEOUS) IMPLANT
CONT SPEC 4OZ CLIKSEAL STRL BL (MISCELLANEOUS) ×24 IMPLANT
COVER SURGICAL LIGHT HANDLE (MISCELLANEOUS) ×2 IMPLANT
CUTTER ECHEON FLEX ENDO 45 340 (ENDOMECHANICALS) ×2 IMPLANT
DERMABOND ADVANCED (GAUZE/BANDAGES/DRESSINGS) ×1
DERMABOND ADVANCED .7 DNX12 (GAUZE/BANDAGES/DRESSINGS) ×1 IMPLANT
DRAIN CHANNEL 28F RND 3/8 FF (WOUND CARE) IMPLANT
DRAIN CHANNEL 32F RND 10.7 FF (WOUND CARE) IMPLANT
DRAPE INCISE 23X17 IOBAN STRL (DRAPES) ×1
DRAPE INCISE IOBAN 23X17 STRL (DRAPES) ×1 IMPLANT
DRAPE LAPAROSCOPIC ABDOMINAL (DRAPES) ×2 IMPLANT
DRAPE WARM FLUID 44X44 (DRAPE) ×2 IMPLANT
ELECT BLADE 6.5 EXT (BLADE) ×2 IMPLANT
ELECT REM PT RETURN 9FT ADLT (ELECTROSURGICAL) ×2
ELECTRODE REM PT RTRN 9FT ADLT (ELECTROSURGICAL) ×1 IMPLANT
GAUZE SPONGE 4X4 12PLY STRL (GAUZE/BANDAGES/DRESSINGS) IMPLANT
GAUZE SPONGE 4X4 12PLY STRL LF (GAUZE/BANDAGES/DRESSINGS) ×2 IMPLANT
GLOVE BIO SURGEON STRL SZ 6.5 (GLOVE) ×12 IMPLANT
GLOVE BIOGEL PI IND STRL 6.5 (GLOVE) ×5 IMPLANT
GLOVE BIOGEL PI INDICATOR 6.5 (GLOVE) ×5
GLOVE SKINSENSE STRL SZ6.0 (GLOVE) ×2 IMPLANT
GLOVE SURG SIGNA 7.5 PF LTX (GLOVE) ×4 IMPLANT
GOWN STRL REUS W/ TWL LRG LVL3 (GOWN DISPOSABLE) ×5 IMPLANT
GOWN STRL REUS W/ TWL XL LVL3 (GOWN DISPOSABLE) ×3 IMPLANT
GOWN STRL REUS W/TWL LRG LVL3 (GOWN DISPOSABLE) ×5
GOWN STRL REUS W/TWL XL LVL3 (GOWN DISPOSABLE) ×3
HANDLE STAPLE ENDO GIA SHORT (STAPLE)
HEMOSTAT SNOW SURGICEL 2X4 (HEMOSTASIS) ×2 IMPLANT
HEMOSTAT SURGICEL 2X14 (HEMOSTASIS) ×2 IMPLANT
KIT BASIN OR (CUSTOM PROCEDURE TRAY) ×2 IMPLANT
KIT ROOM TURNOVER OR (KITS) ×2 IMPLANT
KIT SUCTION CATH 14FR (SUCTIONS) ×2 IMPLANT
NS IRRIG 1000ML POUR BTL (IV SOLUTION) ×6 IMPLANT
PACK CHEST (CUSTOM PROCEDURE TRAY) ×2 IMPLANT
PAD ARMBOARD 7.5X6 YLW CONV (MISCELLANEOUS) ×4 IMPLANT
POUCH ENDO CATCH II 15MM (MISCELLANEOUS) ×2 IMPLANT
POUCH SPECIMEN RETRIEVAL 10MM (ENDOMECHANICALS) ×2 IMPLANT
SCISSORS ENDO CVD 5DCS (MISCELLANEOUS) IMPLANT
SEALANT PROGEL (MISCELLANEOUS) IMPLANT
SEALANT SURG COSEAL 4ML (VASCULAR PRODUCTS) IMPLANT
SEALANT SURG COSEAL 8ML (VASCULAR PRODUCTS) IMPLANT
SHEARS HARMONIC HDI 20CM (ELECTROSURGICAL) ×2 IMPLANT
SOLUTION ANTI FOG 6CC (MISCELLANEOUS) ×2 IMPLANT
SPECIMEN JAR MEDIUM (MISCELLANEOUS) ×2 IMPLANT
SPONGE INTESTINAL PEANUT (DISPOSABLE) ×8 IMPLANT
SPONGE TONSIL 1 RF SGL (DISPOSABLE) ×2 IMPLANT
STAPLE RELOAD 2.5MM WHITE (STAPLE) ×10 IMPLANT
STAPLE RELOAD 45 GRN (STAPLE) ×1 IMPLANT
STAPLE RELOAD 45MM GOLD (STAPLE) ×16 IMPLANT
STAPLE RELOAD 45MM GREEN (STAPLE) ×1
STAPLER ENDO GIA 12MM SHORT (STAPLE) IMPLANT
STAPLER VASCULAR ECHELON 35 (CUTTER) ×2 IMPLANT
SUT PROLENE 4 0 RB 1 (SUTURE)
SUT PROLENE 4-0 RB1 .5 CRCL 36 (SUTURE) IMPLANT
SUT SILK  1 MH (SUTURE) ×2
SUT SILK 1 MH (SUTURE) ×2 IMPLANT
SUT SILK 1 TIES 10X30 (SUTURE) ×2 IMPLANT
SUT SILK 2 0 SH (SUTURE) IMPLANT
SUT SILK 2 0SH CR/8 30 (SUTURE) IMPLANT
SUT SILK 3 0 SH 30 (SUTURE) IMPLANT
SUT SILK 3 0SH CR/8 30 (SUTURE) ×2 IMPLANT
SUT VIC AB 0 CTX 27 (SUTURE) IMPLANT
SUT VIC AB 1 CTX 27 (SUTURE) ×2 IMPLANT
SUT VIC AB 2-0 CT1 27 (SUTURE)
SUT VIC AB 2-0 CT1 TAPERPNT 27 (SUTURE) IMPLANT
SUT VIC AB 2-0 CTX 36 (SUTURE) ×2 IMPLANT
SUT VIC AB 3-0 MH 27 (SUTURE) IMPLANT
SUT VIC AB 3-0 SH 27 (SUTURE)
SUT VIC AB 3-0 SH 27X BRD (SUTURE) IMPLANT
SUT VIC AB 3-0 X1 27 (SUTURE) ×4 IMPLANT
SUT VICRYL 0 UR6 27IN ABS (SUTURE) IMPLANT
SUT VICRYL 2 TP 1 (SUTURE) IMPLANT
SWAB CULTURE ESWAB REG 1ML (MISCELLANEOUS) IMPLANT
SYSTEM SAHARA CHEST DRAIN ATS (WOUND CARE) ×2 IMPLANT
TAPE CLOTH SURG 4X10 WHT LF (GAUZE/BANDAGES/DRESSINGS) ×2 IMPLANT
TIP APPLICATOR SPRAY EXTEND 16 (VASCULAR PRODUCTS) IMPLANT
TOWEL GREEN STERILE (TOWEL DISPOSABLE) ×2 IMPLANT
TOWEL GREEN STERILE FF (TOWEL DISPOSABLE) ×2 IMPLANT
TRAY FOLEY W/METER SILVER 16FR (SET/KITS/TRAYS/PACK) ×2 IMPLANT
TROCAR XCEL BLADELESS 5X75MML (TROCAR) ×2 IMPLANT
TROCAR XCEL NON-BLD 5MMX100MML (ENDOMECHANICALS) IMPLANT
TUNNELER SHEATH ON-Q 11GX8 DSP (PAIN MANAGEMENT) ×2 IMPLANT
WATER STERILE IRR 1000ML POUR (IV SOLUTION) ×4 IMPLANT

## 2017-02-24 NOTE — Anesthesia Preprocedure Evaluation (Addendum)
Anesthesia Evaluation  Patient identified by MRN, date of birth, ID band Patient awake    Reviewed: Allergy & Precautions  Airway Mallampati: II  TM Distance: >3 FB Neck ROM: Full    Dental  (+) Poor Dentition   Pulmonary asthma , sleep apnea , Current Smoker,  Known heavy smoker and sleep apnea, she is noncompliant c her mask use   + rhonchi  + wheezing      Cardiovascular hypertension, + Peripheral Vascular Disease   Rhythm:Regular Rate:Normal     Neuro/Psych    GI/Hepatic GERD  ,  Endo/Other  diabetesMorbid obesity  Renal/GU      Musculoskeletal   Abdominal (+) + obese,   Peds  Hematology   Anesthesia Other Findings   Reproductive/Obstetrics                            Anesthesia Physical Anesthesia Plan  ASA: IV  Anesthesia Plan: General   Post-op Pain Management:    Induction: Intravenous  PONV Risk Score and Plan: 3 and Ondansetron, Dexamethasone, Midazolam, Propofol infusion and Treatment may vary due to age or medical condition  Airway Management Planned: Double Lumen EBT  Additional Equipment: Arterial line and CVP  Intra-op Plan:   Post-operative Plan: Extubation in OR  Informed Consent: I have reviewed the patients History and Physical, chart, labs and discussed the procedure including the risks, benefits and alternatives for the proposed anesthesia with the patient or authorized representative who has indicated his/her understanding and acceptance.   Dental advisory given  Plan Discussed with: CRNA  Anesthesia Plan Comments:        Anesthesia Quick Evaluation

## 2017-02-24 NOTE — H&P (Signed)
PCP is Patient, No Pcp Per Referring Provider is Early, Arvilla Meres, MD      Chief Complaint  Patient presents with  . Lung Mass    Surgical eval, PET Scan 01/06/17, CTA Head/ Neck 12/12/16,    HPI: Susan Farley is a 56 year old woman sent for consultation regarding a left upper lobe nodule  Susan Farley is a 56 year old woman with a history of heavy tobacco abuse (80 pack years, 2 packs per day since age 33), "asthma", type 2 diabetes, hypertension, hyperlipidemia, heart murmur, thalamic stroke in 2015, positive PPD in 1988, reflux, sleep apnea, and depression. Susan Farley has been followed by Dr. Donnetta Hutching since Susan Farley had her stroke in 2015. Susan Farley recently had a CT of the head and neck to evaluate her carotid stenosis. It showed a 1.5 cm nodule in the left upper lobe. A PET CT showed the nodule was markedly hypermetabolic with an SUV of 69.6. There is no hypermetabolic hilar or mediastinal adenopathy. There were areas of hypermetabolism in the nasopharynx without CT correlates. There was an area of hypermetabolism in the full for region and hypermetabolic inguinal lymph nodes.  Susan Farley denies any change in appetite or recent weight loss. Susan Farley gained 4 pounds in the past 3 months. Susan Farley gets short of breath on exertion but says Susan Farley can do 2 flights of stairs without stopping. Susan Farley does have frequent wheezing told Susan Farley was diagnosed with asthma in 1998. Susan Farley has reflux. Susan Farley does not have any residual effects from her stroke. Susan Farley denies any chest pain, pressure, or tightness. Susan Farley denies claudication.  Zubrod Score: At the time of surgery this patient's most appropriate activity status/level should be described as: [x]     0    Normal activity, no symptoms []     1    Restricted in physical strenuous activity but ambulatory, able to do out light work []     2    Ambulatory and capable of self care, unable to do work activities, up and about >50 % of waking hours                              []     3    Only limited self  care, in bed greater than 50% of waking hours []     4    Completely disabled, no self care, confined to bed or chair []     5    Moribund       Past Medical History:  Diagnosis Date  . Colitis   . Hypercholesteremia   . Hypertension   . Retinitis pigmentosa   . Stroke [facility]) Nov. 2015         Past Surgical History:  Procedure Laterality Date  . BONE GRAFT HIP ILIAC CREST    . CATARACT EXTRACTION    . IUD REMOVAL    . OOPHORECTOMY           Family History  Problem Relation Age of Onset  . Adopted: Yes  . Diabetes Unknown        BIOLOGICAL AUNT  . Heart disease Unknown        BIOLOGICAL AUNT    Social History       Social History  Substance Use Topics  . Smoking status: Current Every Day Smoker    Packs/day: 1.00    Types: Cigarettes, E-cigarettes  . Smokeless tobacco: Never Used     Comment: 1 1/2 pk per day plus vaporized  cigarette.   . Alcohol use No          Current Outpatient Prescriptions  Medication Sig Dispense Refill  . aspirin EC 325 MG EC tablet Take 1 tablet (325 mg total) by mouth daily. 30 tablet 0  . docusate sodium (COLACE) 100 MG capsule Take 100 mg by mouth at bedtime as needed for mild constipation.    . dorzolamide (TRUSOPT) 2 % ophthalmic solution Place 1 drop into both eyes 2 (two) times daily.    . hydrochlorothiazide (HYDRODIURIL) 25 MG tablet Take 25 mg by mouth daily.    . pantoprazole (PROTONIX) 40 MG tablet Take 40 mg by mouth daily.     . QUEtiapine (SEROQUEL) 50 MG tablet Take 25 mg by mouth at bedtime.    . simvastatin (ZOCOR) 40 MG tablet Take 40 mg by mouth daily.    . timolol (BETIMOL) 0.5 % ophthalmic solution Place 1 drop into both eyes 2 (two) times daily.    Marland Kitchen venlafaxine XR (EFFEXOR-XR) 150 MG 24 hr capsule Take 150 mg by mouth daily with breakfast. Reported on 07/01/2015    . Vitamin D, Ergocalciferol, (DRISDOL) 50000 UNITS CAPS capsule Take 50,000 Units by mouth every 7  (seven) days.     No current facility-administered medications for this visit.         Allergies  Allergen Reactions  . Shellfish Allergy Anaphylaxis    Review of Systems  Constitutional: Negative for activity change, appetite change and unexpected weight change.  HENT: Negative for trouble swallowing and voice change.   Eyes: Negative for visual disturbance.  Respiratory: Positive for apnea, cough, shortness of breath (With heavy exertion) and wheezing.   Cardiovascular: Negative for chest pain, palpitations and leg swelling.  Gastrointestinal: Positive for abdominal pain (Reflux). Negative for blood in stool.  Genitourinary: Negative for difficulty urinating, dysuria, vaginal bleeding and vaginal discharge.  Musculoskeletal: Negative for arthralgias and gait problem.  Neurological: Positive for numbness (Right upper and lower extremity). Negative for seizures, syncope, weakness and headaches.  Hematological: Negative for adenopathy. Does not bruise/bleed easily.  Psychiatric/Behavioral: Positive for dysphoric mood.  All other systems reviewed and are negative.   BP 128/72   Pulse 100   Resp 20   Ht 5\' 4"  (1.626 m)   Wt 209 lb (94.8 kg)   LMP 11/25/2011   SpO2 97% Comment: RA  BMI 35.87 kg/m  Physical Exam  Constitutional: Susan Farley is oriented to person, place, and time. Susan Farley appears well-developed and well-nourished. No distress.  HENT:  Head: Normocephalic and atraumatic.  Mouth/Throat: No oropharyngeal exudate.  Eyes: Conjunctivae and EOM are normal. No scleral icterus.  Neck: Neck supple. No thyromegaly present.  Cardiovascular: Normal rate and regular rhythm.   Murmur (2/6 systolic murmur left upper sternal border) heard. Pulmonary/Chest: Effort normal and breath sounds normal. No respiratory distress. Susan Farley has no wheezes. Susan Farley has no rales.  Abdominal: Bowel sounds are normal. Susan Farley exhibits no distension. There is no tenderness.  Musculoskeletal: Susan Farley exhibits no  edema or deformity.  Lymphadenopathy:    Susan Farley has no cervical adenopathy.  Neurological: Susan Farley is alert and oriented to person, place, and time. No cranial nerve deficit. Susan Farley exhibits normal muscle tone.  Skin: Skin is warm and dry.  Vitals reviewed.    Diagnostic Tests: NUCLEAR MEDICINE PET SKULL BASE TO THIGH  TECHNIQUE: 10.8 mCi F-18 FDG was injected intravenously. Full-ring PET imaging was performed from the skull base to thigh after the radiotracer. CT data was obtained and used  for attenuation correction and anatomic localization.  FASTING BLOOD GLUCOSE: Value: 128 mg/dl  COMPARISON: CT of the head neck 12/12/2016.  FINDINGS: NECK  Multiple areas of hypermetabolism noted throughout Waldeyer's ring, most notably in the midline of the adenoids in the posterior nasopharynx where there is hypermetabolism (SUVmax = 8.6), which does not appear to correspond to any focal mass or suspicious abnormality on the recent contrast enhanced neck CT; overall, these findings are favored to be physiologic. No hypermetabolic lymphadenopathy noted in the neck.  CHEST  The previously noted left upper lobe pulmonary nodule currently measures 1.7 cm in diameter (axial image 58 of series 5) and is hypermetabolic (SUVmax = 03.4), concerning for neoplasm. No other suspicious appearing hypermetabolic pulmonary nodules are noted. No hypermetabolic mediastinal or hilar lymph nodes. Areas of low-level hypermetabolism are noted within both breasts in the otherwise nondescript appearing areas of fibroglandular densities on the CT images (SUVmax = 4.0 on the right and 3.7 on the left). There is also a lymph node in the axillary tail of the right breast which measures only 6 mm in short axis and has a normal fatty hilum but also demonstrates low-level hypermetabolism (SUVmax = 4.2). Heart size is mildly enlarged. There is no significant pericardial fluid, thickening or pericardial  calcification. There is aortic atherosclerosis, as well as atherosclerosis of the great vessels of the mediastinum and the coronary arteries, including calcified atherosclerotic plaque in the left main, left anterior descending, left circumflex and right coronary arteries.  ABDOMEN/PELVIS  No abnormal hypermetabolic activity within the liver, pancreas, adrenal glands, or spleen. No hypermetabolic lymph nodes in the abdomen. However, in the superficial aspect of the right vulvar region there is a 7 x 17 mm area of sessile soft tissue attenuation which is hypermetabolic (SUVmax = 7.8), with several borderline enlarged and enlarged inguinal lymph nodes bilaterally which demonstrate hypermetabolism, the largest and most hypermetabolic of which is a 25 mm right inguinal lymph node (axial image 181 of series 4) which is hypermetabolic (SUVmax = 7.5). Aortic atherosclerosis. Probable fibroid uterus, with the largest partially calcified lesion measuring 3.5 cm extending exophytically from the left side of the fundus.  SKELETON  No focal hypermetabolic activity to suggest skeletal metastasis.  IMPRESSION: 1. 1.7 cm left upper lobe pulmonary nodule is hypermetabolic highly concerning for neoplasm. Whether this represents a primary bronchogenic carcinoma or a solitary pulmonary metastasis is uncertain. No hypermetabolic mediastinal or hilar lymphadenopathy noted. 2. Sessile skin lesion in the right vulvar region which demonstrates hypermetabolism with bilateral inguinal lymphadenopathy. Findings are concerning for potential vulvar carcinoma and clinical correlation is recommended. 3. Multiple small areas of low-level hypermetabolism in the breasts bilaterally with mildly hypermetabolic lymph node in the axillary tail of the right breast. These findings are nonspecific if, but correlation with mammography is recommended. 4. Rather diffuse hypermetabolism throughout Waldeyer's ring,  most focal in the midline of the adenoids in the posterior nasopharynx. This is favored to be physiologic, as there is no definite pathologic correlate noted in these regions on recent contrast enhanced neck CT. 5. Aortic atherosclerosis, in addition to left main and 3 vessel coronary artery disease. Please note that although the presence of coronary artery calcium documents the presence of coronary artery disease, the severity of this disease and any potential stenosis cannot be assessed on this non-gated CT examination. Assessment for potential risk factor modification, dietary therapy or pharmacologic therapy may be warranted, if clinically indicated. 6. Mild cardiomegaly. 7. Fibroid uterus.   Electronically Signed By:  Vinnie Langton M.D. On: 01/06/2017 13:24 I personally reviewed the CT and PET CT images and concur with the findings noted above.  Impression: 56 year old woman with a history of heavy tobacco abuse has an incidentally discovered left upper lobe nodule. This is markedly hypermetabolic on PET with an SUV of 12.8. Has to be considered a new primary bronchogenic carcinoma must be proven otherwise. Susan Farley had some abnormalities in the vulvar region on PET as well and Susan Farley will see her gynecologist about that. It is possible that the lung lesion represents metastatic disease.  Susan Farley has a history of positive back in 1988 and was treated with INH. For that reason I think we should check a Quanteferon Gold, but I would be surprised if this turned out to be granulomatous disease.  Susan Farley needs pulmonary function testing with and without bronchodilators.  Susan Farley does have evidence of coronary atherosclerosis on PET, but does not have any symptoms suspicious for angina.    Regarding the lung nodule. I think there is a high probability this is a primary lung cancer. My approach that would be left VATS with wedge resection followed by possible left upper lobectomy if the frozen  section was positive for cancer. Another option would be to do a biopsy and sent for primary radiation treatment. Susan Farley would most likely be a candidate for stereotactic radiation. We discussed the pros and cons of each approach.  We discussed the general nature of the proposed operation including the need for general anesthesia, the incisions to be used, using the drainage tube postoperatively, expected hospital stay, and the overall recovery. I informed her the indications, risks, benefits, and alternatives. Susan Farley understands the risk include, but are not limited to death, MI, DVT, PE, bleeding, possible need for transfusion, infection, stroke, prolonged air leak, irregular heart rhythms, as well as the possibility of other unforeseeable complications.  Susan Farley wishes to think about her options. Susan Farley will contact her gynecologist. Susan Farley will let us know how Susan Farley would like to proceed.  Plan: Quanteferon gold Gyn evaluation PFTs Left VATS, wedge resection, possible lobectomy  Melrose Nakayama, MD [facility] [phone]    Electronically signed by Melrose Nakayama, MD at 01/12/2017 4:27 PM    Patient seen and examined. GYN w/u showed no evidence of malignancy PFTs OK Exam unchanged  Remo Lipps C. Roxan Hockey, MD [facility] [phone]

## 2017-02-24 NOTE — Brief Op Note (Addendum)
02/24/2017  3:43 PM  PATIENT:  Susan Farley  56 y.o. 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  PROCEDURE:  Procedure(s):  Left VIDEO ASSISTED THORACOSCOPY  -Wedge Resection Left Upper Lobe -Thoracoscopic Completion Left Upper Lobectomy  -Lymph Node Dissection -Insertion of On-Q Anesthetic Catheter  SURGEON:  Surgeon(s) and Role:    Melrose Nakayama, MD - Primary  PHYSICIAN ASSISTANT: Ellwood Handler PA-C  ANESTHESIA:   general  EBL:  Total I/O In: 1800 [I.V.:1800] Out: 460 [Urine:260; Blood:200]  BLOOD ADMINISTERED:none  DRAINS: 28 Straight Chest Tube   LOCAL MEDICATIONS USED:  MARCAINE     SPECIMEN:  Source of Specimen:  Wedge Resection LUL, Completion LUL, Lymph Nodes  DISPOSITION OF SPECIMEN:  PATHOLOGY  COUNTS:  YES  TOURNIQUET:  * No tourniquets in log *  DICTATION: .Dragon Dictation  PLAN OF CARE: Admit to inpatient   PATIENT DISPOSITION:  ICU - extubated and stable.   Delay start of Pharmacological VTE agent (>24hrs) due to surgical blood loss or risk of bleeding: yes

## 2017-02-24 NOTE — Anesthesia Procedure Notes (Signed)
Procedure Name: Intubation Date/Time: 02/24/2017 11:40 AM Performed by: Julieta Bellini Pre-anesthesia Checklist: Patient identified, Emergency Drugs available, Patient being monitored and Suction available Patient Re-evaluated:Patient Re-evaluated prior to induction Oxygen Delivery Method: Circle system utilized Preoxygenation: Pre-oxygenation with 100% oxygen Induction Type: IV induction Ventilation: Mask ventilation without difficulty Laryngoscope Size: Mac and 4 Grade View: Grade I Tube type: Oral Endobronchial tube: Left and 37 Fr Number of attempts: 1 Airway Equipment and Method: Stylet Placement Confirmation: ETT inserted through vocal cords under direct vision,  positive ETCO2 and breath sounds checked- equal and bilateral Secured at: 29 cm Tube secured with: Tape Dental Injury: Teeth and Oropharynx as per pre-operative assessment

## 2017-02-24 NOTE — Anesthesia Procedure Notes (Deleted)
Arterial Line Insertion Start/End9/12/2016 10:37 AM Performed by: Myna Bright, CRNA  Patient location: Pre-op. Preanesthetic checklist: patient identified, IV checked, site marked, risks and benefits discussed, surgical consent, monitors and equipment checked, pre-op evaluation, timeout performed and anesthesia consent Lidocaine 1% used for infiltration radial was placed Catheter size: 20 G Hand hygiene performed  and maximum sterile barriers used   Attempts: 1 Procedure performed without using ultrasound guided technique. Following insertion, dressing applied and Biopatch. Post procedure assessment: normal  Patient tolerated the procedure well with no immediate complications.

## 2017-02-24 NOTE — Transfer of Care (Signed)
Immediate Anesthesia Transfer of Care Note  Patient: Susan Farley  Procedure(s) Performed: Procedure(s): Left VIDEO ASSISTED THORACOSCOPY (VATS)/WEDGE RESECTION (Left) Left upper lobe LOBECTOMY (Left) NODE DISSECTION (Left)  Patient Location: PACU  Anesthesia Type:General  Level of Consciousness: awake, alert , oriented and patient cooperative  Airway & Oxygen Therapy: Patient Spontanous Breathing and Patient connected to nasal cannula oxygen  Post-op Assessment: Report given to RN, Post -op Vital signs reviewed and stable and Patient moving all extremities X 4  Post vital signs: Reviewed and stable  Last Vitals:  Vitals:   02/24/17 1105 02/24/17 1106  BP:    Pulse: 92 82  Resp: 20   Temp:    SpO2: 100% 98%    Last Pain:  Vitals:   02/24/17 1002  TempSrc: Oral      Patients Stated Pain Goal: 3 (05/39/76 7341)  Complications: No apparent anesthesia complications

## 2017-02-24 NOTE — Anesthesia Procedure Notes (Signed)
Procedures

## 2017-02-24 NOTE — Progress Notes (Signed)
      [address]       [place] [address]             [phone]      S/p left upper lobectomy  BP (!) 151/71   Pulse 85   Temp (!) 97.5 F (36.4 C)   Resp 16   Ht 5\' 4"  (1.626 m)   Wt 219 lb (99.3 kg)   LMP 11/25/2011   SpO2 94%   BMI 37.59 kg/m   Intake/Output Summary (Last 24 hours) at 02/24/17 1909 Last data filed at 02/24/17 1800  Gross per 24 hour  Intake             1950 ml  Output              610 ml  Net             1340 ml   No significant air leak  C/o incisional pain- encouraged to use PCA, will add toradol  Remo Lipps C. Roxan Hockey, MD [facility] [phone]

## 2017-02-24 NOTE — Anesthesia Procedure Notes (Addendum)
Central Venous Catheter Insertion Performed by: Rica Koyanagi, anesthesiologist Start/End9/12/2016 11:15 AM, 02/24/2017 11:30 AM Patient location: OR. Preanesthetic checklist: patient identified, IV checked, site marked, risks and benefits discussed, surgical consent, monitors and equipment checked, pre-op evaluation and timeout performed Position: Trendelenburg Lidocaine 1% used for infiltration and patient sedated Hand hygiene performed , maximum sterile barriers used  and Seldinger technique used Catheter size: 8 Fr Central line was placed.Double lumen Procedure performed using ultrasound guided technique. Ultrasound Notes:anatomy identified, needle tip was noted to be adjacent to the nerve/plexus identified, no ultrasound evidence of intravascular and/or intraneural injection and image(s) printed for medical record Attempts: 1 Following insertion, line sutured, dressing applied and Biopatch. Post procedure assessment: blood return through all ports  Patient tolerated the procedure well with no immediate complications.

## 2017-02-24 NOTE — Interval H&P Note (Signed)
History and Physical Interval Note:  02/24/2017 10:12 AM  Susan Farley  has presented today for surgery, with the diagnosis of LUL MASS  The various methods of treatment have been discussed with the patient and family. After consideration of risks, benefits and other options for treatment, the patient has consented to  Procedure(s): VIDEO ASSISTED THORACOSCOPY (VATS)/WEDGE RESECTION (Left) possible LOBECTOMY (Left) as a surgical intervention .  The patient's history has been reviewed, patient examined, no change in status, stable for surgery.  I have reviewed the patient's chart and labs.  Questions were answered to the patient's satisfaction.     Melrose Nakayama

## 2017-02-24 NOTE — Anesthesia Postprocedure Evaluation (Signed)
Anesthesia Post Note  Patient: Susan Farley  Procedure(s) Performed: Procedure(s) (LRB): Left VIDEO ASSISTED THORACOSCOPY (VATS)/WEDGE RESECTION (Left) Left upper lobe LOBECTOMY (Left) NODE DISSECTION (Left)     Patient location during evaluation: PACU Anesthesia Type: General Level of consciousness: awake and alert Pain management: pain level controlled Vital Signs Assessment: post-procedure vital signs reviewed and stable Respiratory status: spontaneous breathing, nonlabored ventilation, respiratory function stable and patient connected to nasal cannula oxygen Cardiovascular status: blood pressure returned to baseline and stable Postop Assessment: no signs of nausea or vomiting Anesthetic complications: no    Last Vitals:  Vitals:   02/24/17 1800 02/24/17 1805  BP:    Pulse: 85 88  Resp: 18 20  Temp: (!) 36.4 C   SpO2: 97% 94%                  Effie Berkshire

## 2017-02-24 NOTE — Anesthesia Procedure Notes (Addendum)
Arterial Line Insertion Start/End9/12/2016 10:37 AM Performed by: Myna Bright, CRNA  Patient location: Pre-op. Preanesthetic checklist: patient identified, IV checked, site marked, risks and benefits discussed, surgical consent, monitors and equipment checked, pre-op evaluation, timeout performed and anesthesia consent Lidocaine 1% used for infiltration radial was placed Catheter size: 20 G Hand hygiene performed  and maximum sterile barriers used   Attempts: 1 Procedure performed without using ultrasound guided technique. Following insertion, Biopatch and dressing applied. Post procedure assessment: normal  Patient tolerated the procedure well with no immediate complications.

## 2017-02-25 ENCOUNTER — Encounter (HOSPITAL_COMMUNITY): Payer: Self-pay | Admitting: *Deleted

## 2017-02-25 ENCOUNTER — Inpatient Hospital Stay (HOSPITAL_COMMUNITY): Payer: Federal, State, Local not specified - PPO

## 2017-02-25 DIAGNOSIS — J90 Pleural effusion, not elsewhere classified: Secondary | ICD-10-CM | POA: Diagnosis not present

## 2017-02-25 DIAGNOSIS — R918 Other nonspecific abnormal finding of lung field: Secondary | ICD-10-CM | POA: Diagnosis not present

## 2017-02-25 LAB — POCT I-STAT 7, (LYTES, BLD GAS, ICA,H+H)
Acid-Base Excess: 3 mmol/L — ABNORMAL HIGH (ref 0.0–2.0)
BICARBONATE: 30 mmol/L — AB (ref 20.0–28.0)
CALCIUM ION: 1.19 mmol/L (ref 1.15–1.40)
HEMATOCRIT: 37 % (ref 36.0–46.0)
Hemoglobin: 12.6 g/dL (ref 12.0–15.0)
O2 Saturation: 89 %
PCO2 ART: 54.5 mmHg — AB (ref 32.0–48.0)
POTASSIUM: 3.3 mmol/L — AB (ref 3.5–5.1)
SODIUM: 143 mmol/L (ref 135–145)
TCO2: 32 mmol/L (ref 22–32)
pH, Arterial: 7.349 — ABNORMAL LOW (ref 7.350–7.450)
pO2, Arterial: 61 mmHg — ABNORMAL LOW (ref 83.0–108.0)

## 2017-02-25 LAB — CBC
HCT: 36.3 % (ref 36.0–46.0)
Hemoglobin: 11.2 g/dL — ABNORMAL LOW (ref 12.0–15.0)
MCH: 27.6 pg (ref 26.0–34.0)
MCHC: 30.9 g/dL (ref 30.0–36.0)
MCV: 89.4 fL (ref 78.0–100.0)
PLATELETS: 266 10*3/uL (ref 150–400)
RBC: 4.06 MIL/uL (ref 3.87–5.11)
RDW: 16.8 % — ABNORMAL HIGH (ref 11.5–15.5)
WBC: 11.6 10*3/uL — AB (ref 4.0–10.5)

## 2017-02-25 LAB — POCT I-STAT 3, ART BLOOD GAS (G3+)
BICARBONATE: 26.2 mmol/L (ref 20.0–28.0)
O2 Saturation: 92 %
PO2 ART: 68 mmHg — AB (ref 83.0–108.0)
TCO2: 28 mmol/L (ref 22–32)
pCO2 arterial: 46.3 mmHg (ref 32.0–48.0)
pH, Arterial: 7.36 (ref 7.350–7.450)

## 2017-02-25 LAB — BASIC METABOLIC PANEL
Anion gap: 6 (ref 5–15)
BUN: 7 mg/dL (ref 6–20)
CALCIUM: 8.5 mg/dL — AB (ref 8.9–10.3)
CO2: 29 mmol/L (ref 22–32)
Chloride: 107 mmol/L (ref 101–111)
Creatinine, Ser: 0.76 mg/dL (ref 0.44–1.00)
GFR calc non Af Amer: >60 mL/min (ref >60)
Glucose, Bld: 144 mg/dL — ABNORMAL HIGH (ref 65–99)
POTASSIUM: 3.2 mmol/L — AB (ref 3.5–5.1)
SODIUM: 142 mmol/L (ref 135–145)

## 2017-02-25 MED ORDER — SODIUM CHLORIDE 0.45 % IV SOLN
INTRAVENOUS | Status: DC
  Administered 2017-02-25 – 2017-02-27 (×2): via INTRAVENOUS

## 2017-02-25 MED ORDER — QUETIAPINE FUMARATE 25 MG PO TABS
25.0000 mg | ORAL_TABLET | Freq: Every day | ORAL | Status: DC
  Administered 2017-02-25 – 2017-02-27 (×3): 25 mg via ORAL
  Filled 2017-02-25 (×3): qty 1

## 2017-02-25 MED ORDER — ALBUTEROL SULFATE (2.5 MG/3ML) 0.083% IN NEBU: 2.5000 mg | INHALATION_SOLUTION | RESPIRATORY_TRACT | Status: DC | PRN

## 2017-02-25 MED ORDER — ENOXAPARIN SODIUM 40 MG/0.4ML ~~LOC~~ SOLN
40.0000 mg | SUBCUTANEOUS | Status: DC
  Administered 2017-02-25 – 2017-02-28 (×4): 40 mg via SUBCUTANEOUS
  Filled 2017-02-25 (×4): qty 0.4

## 2017-02-25 MED ORDER — NICOTINE 21 MG/24HR TD PT24
21.0000 mg | MEDICATED_PATCH | Freq: Every day | TRANSDERMAL | Status: DC
  Administered 2017-02-25 – 2017-02-28 (×4): 21 mg via TRANSDERMAL
  Filled 2017-02-25 (×4): qty 1

## 2017-02-25 MED ORDER — POTASSIUM CHLORIDE 10 MEQ/50ML IV SOLN
10.0000 meq | INTRAVENOUS | Status: AC
  Administered 2017-02-25 (×3): 10 meq via INTRAVENOUS
  Filled 2017-02-25 (×3): qty 50

## 2017-02-25 MED ORDER — METFORMIN HCL 500 MG PO TABS
500.0000 mg | ORAL_TABLET | Freq: Two times a day (BID) | ORAL | Status: DC
  Administered 2017-02-25 – 2017-02-28 (×7): 500 mg via ORAL
  Filled 2017-02-25 (×7): qty 1

## 2017-02-25 MED ORDER — POTASSIUM CHLORIDE 10 MEQ/50ML IV SOLN
10.0000 meq | INTRAVENOUS | Status: AC
  Administered 2017-02-25 (×2): 10 meq via INTRAVENOUS
  Filled 2017-02-25 (×2): qty 50

## 2017-02-25 MED ORDER — DOCUSATE SODIUM 100 MG PO CAPS
200.0000 mg | ORAL_CAPSULE | Freq: Every day | ORAL | Status: DC
  Administered 2017-02-25 – 2017-02-27 (×3): 200 mg via ORAL
  Filled 2017-02-25 (×3): qty 2

## 2017-02-25 MED ORDER — AMLODIPINE BESYLATE 10 MG PO TABS
10.0000 mg | ORAL_TABLET | Freq: Every day | ORAL | Status: DC
  Administered 2017-02-25 – 2017-02-28 (×4): 10 mg via ORAL
  Filled 2017-02-25 (×4): qty 1

## 2017-02-25 NOTE — Progress Notes (Signed)
      [address]       [place] [address]             [phone]      Awaiting bed on step down  BP 124/71   Pulse 73   Temp 98.2 F (36.8 C) (Oral)   Resp 20   Ht 5\' 4"  (1.626 m)   Wt 220 lb 10.9 oz (100.1 kg)   LMP 11/25/2011   SpO2 95%   BMI 37.88 kg/m    Intake/Output Summary (Last 24 hours) at 02/25/17 1929 Last data filed at 02/25/17 1800  Gross per 24 hour  Intake          3093.33 ml  Output             3030 ml  Net            63.33 ml    Remo Lipps C. Roxan Hockey, MD [facility] [phone]

## 2017-02-25 NOTE — Progress Notes (Signed)
1 Day Post-Op Procedure(s) (LRB): Left VIDEO ASSISTED THORACOSCOPY (VATS)/WEDGE RESECTION (Left) Left upper lobe LOBECTOMY (Left) NODE DISSECTION (Left) Subjective: Some incisional pain Denies nausea  Objective: Vital signs in last 24 hours: Temp:  [97 F (36.1 C)-98.4 F (36.9 C)] 98.4 F (36.9 C) (09/08 0759) Pulse Rate:  [63-92] 63 (09/08 0700) Cardiac Rhythm: Normal sinus rhythm (09/07 2000) Resp:  [13-25] 22 (09/08 0800) BP: (108-163)/(57-90) 108/59 (09/08 0700) SpO2:  [90 %-100 %] 100 % (09/08 0806) Arterial Line BP: (66-197)/(25-100) 139/68 (09/08 0700) Weight:  [219 lb (99.3 kg)-220 lb 10.9 oz (100.1 kg)] 220 lb 10.9 oz (100.1 kg) (09/08 0600)  Hemodynamic parameters for last 24 hours:    Intake/Output from previous day: 09/07 0701 - 09/08 0700 In: 4665 [P.O.:1440; I.V.:3075; IV Piggyback:150] Out: 2065 [Urine:1585; Blood:200; Chest Tube:280] Intake/Output this shift: No intake/output data recorded.  General appearance: alert, cooperative and no distress Neurologic: intact Heart: regular rate and rhythm Lungs: diminished breath sounds bibasilar Abdomen: normal findings: soft, non-tender  Lab Results:  Recent Labs  02/22/17 1258  02/24/17 1217 02/25/17 0337  WBC 13.0*  --   --  11.6*  HGB 14.2  < > 12.6 11.2*  HCT 43.5  < > 37.0 36.3  PLT 313  --   --  266  < > = values in this interval not displayed. BMET:  Recent Labs  02/22/17 1258  02/24/17 1217 02/25/17 0337  NA 141  < > 143 142  K 3.0*  < > 3.3* 3.2*  CL 101  --   --  107  CO2 29  --   --  29  GLUCOSE 146*  --   --  144*  BUN 8  --   --  7  CREATININE 0.92  --   --  0.76  CALCIUM 9.7  --   --  8.5*  < > = values in this interval not displayed.  PT/INR:  Recent Labs  02/22/17 1258  LABPROT 12.2  INR 0.92   ABG    Component Value Date/Time   PHART 7.360 02/25/2017 0354   HCO3 26.2 02/25/2017 0354   TCO2 28 02/25/2017 0354   O2SAT 92.0 02/25/2017 0354   CBG (last 3)   Recent  Labs  02/24/17 1004 02/24/17 1651 02/24/17 2022  GLUCAP 151* 160* 159*    Assessment/Plan: S/P Procedure(s) (LRB): Left VIDEO ASSISTED THORACOSCOPY (VATS)/WEDGE RESECTION (Left) Left upper lobe LOBECTOMY (Left) NODE DISSECTION (Left) -POD # 1 doing well  CV- hypertension- restart HCTZ, will add norvasc  RESP_ IS. No air leak- CT to water seal  RENAL- creatinine OK  Hypokalemia- supplement K  ENDO- CBG mildly elevated- dc D5, restart metformin, cover with SSI  Tobacco- requesting patch to help with cravings  SCD + enoxaparin for DVT prophylaxis   LOS: 1 day    Melrose Nakayama 02/25/2017

## 2017-02-25 NOTE — Op Note (Signed)
Susan Farley, Susan Farley NO.:  [id number]  MEDICAL RECORD NO.:  [id number]  LOCATION:  PERIO                        FACILITY:  [facility]  PHYSICIAN:  Revonda Standard. Roxan Hockey, M.D.DATE OF BIRTH:  09-19-1960  DATE OF PROCEDURE:  02/24/2017 DATE OF DISCHARGE:                              OPERATIVE REPORT   PREOPERATIVE DIAGNOSIS:  Left upper lobe nodule.  POSTOPERATIVE DIAGNOSIS:  Squamous cell carcinoma, left upper lobe, clinical stage IA.  PROCEDURES:   Left video-assisted thoracoscopy, Wedge resection of left upper lobe, Thoracoscopic left upper lobectomy,  Lymph node dissection, Insertion of On-Q local anesthetic catheter.  SURGEON:  Revonda Standard. Roxan Hockey, M.D.  ASSISTANT:  Ellwood Handler, PA-C.  ANESTHESIA:  General.  FINDINGS:  Frozen section of left upper lobe mass showed squamous cell Carcinoma. Multiple enlarged lymph nodes.  Bronchial margins negative for tumor.  Only 2 arterial branches to the upper lobe.  CLINICAL NOTE:  Susan Farley is a 56 year old woman with a history of tobacco abuse, who recently had a CT of the head and neck to evaluate a carotid stenosis.  She was found to have a 1.5-cm nodule in the left upper lobe on PET-CT.  This nodule was markedly hypermetabolic.  There was no evidence of distant disease.  There were some questionable findings in the pelvic region.  She was seen by Gynecology, and no tumor was seen in that area.  She was advised to undergo VATS for wedge resection, to be followed by lobectomy if the intraoperative frozen section was positive for cancer.  The indications, risks, benefits, and alternatives were discussed in detail with the patient.  She understood and accepted the risks and agreed to proceed.  OPERATIVE NOTE:  Susan Farley was brought to the preoperative holding area on February 24, 2017.  Anesthesia placed a central line and an arterial blood pressure monitoring line.  She was taken to the  operating room, anesthetized, and intubated with a double-lumen endotracheal tube. Intravenous antibiotics were administered.  A Foley catheter was placed. Sequential compression devices were placed on the calves for DVT prophylaxis.  She was placed in a right lateral decubitus position, and the left chest was prepped and draped in usual sterile fashion.  Single lung ventilation of the right lung was initiated and was tolerated well throughout the procedure.  After performing a time-out, an incision was made in the seventh interspace in the midaxillary line.  A 5-mm port was inserted into the chest.  The thoracoscope was advanced into the chest.  There was good isolation of the left lung.  There was no pleural effusion, and no abnormality of the parietal pleura.  A 5-cm working incision was made in the fourth interspace anterolaterally.  No rib spreading was performed during the procedure.  The upper lobe was grasped.  The nodule was palpated in the posterior aspect of the left upper lobe.  A wedge resection was performed with sequential firings of an endoscopic stapler.  An Echelon-powered stapler with gold cartridges was used.  The specimen was placed into an endoscopic retrieval bag, removed and sent for frozen section.  While awaiting the results of the frozen section, an On-Q local anesthetic catheter was placed through  a stab incision posteriorly.  It was tunneled into a subpleural location and primed with 5 mL of 0.5% Marcaine.  The frozen section returned showing squamous cell carcinoma, and the decision was made to proceed with a lobectomy as discussed with the patient preoperatively.  The inferior ligament was divided with the Harmonic Scalpel.  No node was seen in that area.  The pleural reflection was divided at the hilum anteriorly and posteriorly. There was only a thin layer of pleura overlying the pulmonary artery to the basilar segments of the left lower lobe.  This  pleura was incised. There were 2 enlarged lymph nodes on either side of the bifurcation of the lingular pulmonary artery branch.  These nodes were dissected out and sent as separate specimens.  There was a single trunk to the lingula.  This was encircled and divided with the endoscopic vascular stapler.  The minor fissure was completed anteriorly with a single firing of the Echelon stapler, and a large level 11 node was identified and removed and sent as a separate specimen.  The fissure then was completed working from anterior to posterior with sequential firings of the Echelon stapler.  The lung was retracted posteriorly.  The superior pulmonary vein was identified.  There was a large node adjacent to the vein, which was removed.  The vein was encircled and divided with the endoscopic vascular stapler.  A separate port incision was made anterior to the first port to allow passage of the stapler to divide the pulmonary vein.  At this point, the dissection was difficult.  There was a large pulmonary artery branch, which ultimately was the only branch feeding the non-lingular segments of the left upper lobe.  This was not clear initially.  Additional nodes were removed during the dissection of this branch.  Once it had clearly been determined that this was the only remaining pulmonary artery branch, it was encircled and divided with the endoscopic vascular stapler.  The Echelon stapler with a green cartridge then was placed across the left upper lobe bronchus and closed.  A test inflation showed good aeration of the lower lobe.  The stapler was fired transecting the left upper lobe bronchus.  The left upper lobe was placed into an endoscopic retrieval bag, removed and sent for frozen section of the bronchial margin, which returned with no tumor seen.  The chest was copiously irrigated with warm saline.  A test inflation to 30 cm of water revealed no leakage from the bronchial stump.  Level  7, 4R and level 5 nodes were dissected out using the Harmonic Scalpel.  These were all sent for permanent pathology.  There was good hemostasis at the staple lines.  A 28-French chest tube was placed through the anterior port incision and secured with a #1 silk suture.  The lower lobe was reinflated.  The working incision was closed in 3 layers with a #1 Vicryl fascial suture, a 2-0 Vicryl subcutaneous suture, and a 3-0 Vicryl subcuticular suture.  The remaining port incision was closed with a 0 Vicryl fascial suture and a 3-0 Vicryl subcuticular suture.  The patient was placed back in a supine position.  The chest tube was placed to suction.  She was extubated in the operating room and taken to the postanesthetic care unit in good condition.  All sponge, needle, and instrument counts were correct at the end of the procedure.     Revonda Standard Roxan Hockey, M.D.     SCH/MEDQ  D:  02/24/2017  T:  02/25/2017  Job:  [id number]

## 2017-02-25 NOTE — Progress Notes (Signed)
K+= 3.2 and creat= 0.76 w/ urine o/p > 30cc/hr; TCTS KCL protocol initiated with 10 mEq KCL in 50cc IV x 3, each over one hour.

## 2017-02-26 ENCOUNTER — Inpatient Hospital Stay (HOSPITAL_COMMUNITY): Payer: Federal, State, Local not specified - PPO

## 2017-02-26 DIAGNOSIS — J90 Pleural effusion, not elsewhere classified: Secondary | ICD-10-CM | POA: Diagnosis not present

## 2017-02-26 DIAGNOSIS — R918 Other nonspecific abnormal finding of lung field: Secondary | ICD-10-CM | POA: Diagnosis not present

## 2017-02-26 LAB — CBC
HCT: 34.8 % — ABNORMAL LOW (ref 36.0–46.0)
HEMOGLOBIN: 10.9 g/dL — AB (ref 12.0–15.0)
MCH: 28.4 pg (ref 26.0–34.0)
MCHC: 31.3 g/dL (ref 30.0–36.0)
MCV: 90.6 fL (ref 78.0–100.0)
Platelets: 217 10*3/uL (ref 150–400)
RBC: 3.84 MIL/uL — AB (ref 3.87–5.11)
RDW: 17.4 % — ABNORMAL HIGH (ref 11.5–15.5)
WBC: 9.2 10*3/uL (ref 4.0–10.5)

## 2017-02-26 LAB — TYPE AND SCREEN
ABO/RH(D): O POS
Antibody Screen: NEGATIVE
Unit division: 0
Unit division: 0

## 2017-02-26 LAB — BPAM RBC
BLOOD PRODUCT EXPIRATION DATE: 201810042359
Blood Product Expiration Date: 201810042359
ISSUE DATE / TIME: 201809071201
ISSUE DATE / TIME: 201809071201
UNIT TYPE AND RH: 5100
UNIT TYPE AND RH: 5100

## 2017-02-26 LAB — COMPREHENSIVE METABOLIC PANEL
ALK PHOS: 60 U/L (ref 38–126)
ALT: 21 U/L (ref 14–54)
AST: 33 U/L (ref 15–41)
Albumin: 2.8 g/dL — ABNORMAL LOW (ref 3.5–5.0)
Anion gap: 7 (ref 5–15)
BUN: 8 mg/dL (ref 6–20)
CALCIUM: 8.4 mg/dL — AB (ref 8.9–10.3)
CO2: 28 mmol/L (ref 22–32)
CREATININE: 0.72 mg/dL (ref 0.44–1.00)
Chloride: 105 mmol/L (ref 101–111)
Glucose, Bld: 154 mg/dL — ABNORMAL HIGH (ref 65–99)
Potassium: 3.5 mmol/L (ref 3.5–5.1)
Sodium: 140 mmol/L (ref 135–145)
TOTAL PROTEIN: 6 g/dL — AB (ref 6.5–8.1)
Total Bilirubin: 0.3 mg/dL (ref 0.3–1.2)

## 2017-02-26 MED ORDER — POTASSIUM CHLORIDE CRYS ER 20 MEQ PO TBCR
40.0000 meq | EXTENDED_RELEASE_TABLET | Freq: Once | ORAL | Status: AC
  Administered 2017-02-26: 40 meq via ORAL
  Filled 2017-02-26: qty 2

## 2017-02-26 MED ORDER — POTASSIUM CHLORIDE CRYS ER 20 MEQ PO TBCR
20.0000 meq | EXTENDED_RELEASE_TABLET | ORAL | Status: DC | PRN
Start: 2017-02-26 — End: 2017-02-28
  Administered 2017-02-26: 20 meq via ORAL
  Filled 2017-02-26: qty 1

## 2017-02-26 MED ORDER — ALBUTEROL SULFATE (2.5 MG/3ML) 0.083% IN NEBU
2.5000 mg | INHALATION_SOLUTION | Freq: Four times a day (QID) | RESPIRATORY_TRACT | Status: DC
  Administered 2017-02-26 – 2017-02-27 (×7): 2.5 mg via RESPIRATORY_TRACT
  Filled 2017-02-26 (×7): qty 3

## 2017-02-26 NOTE — Progress Notes (Signed)
Pt family called RN into room d/t Rt IJ DLC leaking IVF, upon inspection Pt's gown soaked with IVF and Pt c/o of pain at site. IVF and PCA moved to Lt hand PIV and dressing change done to IJ DLC. no active leaking noted.   During morning lab draw, smart set difficult to draw from, so a flush was done to clear line causing Pt excruciating pain. Attempts to draw back on second port causing Pt pain as well. Decision was made to remove IJ DLC d/t malfunction/malpositioning. Cath removed without difficulty and intact. No noted fractures seen in catheter. Site dressed according to policy, MD to be notified during A.M. Rounds.

## 2017-02-26 NOTE — Progress Notes (Signed)
Report called to Jeneen Rinks RN on 4E. All patient items accounted for and family is at bedside at this time.

## 2017-02-26 NOTE — Plan of Care (Signed)
Problem: Cardiac: Goal: Hemodynamic stability will improve Outcome: Progressing Vital signs stable.  Problem: Respiratory: Goal: Respiratory status will improve Outcome: Progressing Pt maintaining O2 sats > 94% while decreases in nasal cannula O2. I.S. Also improved to consistently 1 L.. Single Lt PCT patent minimal output, changed to water seal without air leak.

## 2017-02-26 NOTE — Discharge Summary (Signed)
Physician Discharge Summary  Patient ID: Susan Farley MRN: [id number] DOB/AGE: 1960/07/09 56 y.o.  Admit date: 02/24/2017 Discharge date: 02/28/2017  Admission Diagnoses:  Left upper lobe nodule Patient Active Problem List   Diagnosis Date Noted  . Peripheral arterial disease ([place]) 04/26/2016  . Tobacco use disorder 02/10/2015  . OSA on CPAP 02/10/2015  . Type 2 diabetes mellitus with other circulatory complications ([place])   . Numbness on right side   . Stroke ([place])   . Acute ischemic VBA thalamic stroke ([place]) 04/24/2014  . Essential hypertension 04/24/2014  . Hyperglycemia 04/24/2014  . Acute CVA (cerebrovascular accident) ([place]) 04/24/2014  . Leukocytosis 04/24/2014  . Hypokalemia 04/24/2014  . Numbness   . HTN (hypertension) 07/25/2012  . HLD (hyperlipidemia) 07/25/2012  . 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 S/P lobectomy of lung 02/24/2017  . Peripheral arterial disease ([place]) 04/26/2016  . Tobacco use disorder 02/10/2015  . OSA on CPAP 02/10/2015  . Type 2 diabetes mellitus with other circulatory complications ([place])   . Numbness on right side   . Stroke ([place])   . Acute ischemic VBA thalamic stroke ([place]) 04/24/2014  . Essential hypertension 04/24/2014  . Hyperglycemia 04/24/2014  . Acute CVA (cerebrovascular accident) ([employer]) 04/24/2014  . Leukocytosis 04/24/2014  . Hypokalemia 04/24/2014  . Numbness   . HTN (hypertension) 07/25/2012  . HLD (hyperlipidemia) 07/25/2012  . 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 GERD (gastroesophageal reflux disease) 07/25/2012   Discharged Condition: good  History of Present Illness:  Susan Farley is a 56 yo female with known history of tobacco absue, DM type 2, hypertension, hyperlipidemia, hurt murmur, stroke (2015), and carotid stenosis.  She has been  followed routinely by Dr. Donnetta Hutching who obtained a CTA of her head and neck which revealed a 1.5 cm nodule her left upper lobe.  PET CT was obtained and showed the nodule to be hypermetabolic with no adenopathy present.  There was abnormalities in the vulvar region of her PET as well.  She was referred to Dr. Roxan Hockey for evaluation at which time she denies appetite changes or weight loss.  She gets short of breath with exertion but is able to ambulate 2 flights of stairs without rest.  She denies chest pain/pressure or tightness.  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  It was felt she should obtain a Quanterferon test which was ultimately negative.  It was felt the lung nodule was likely a primary lung cancer, but there was a possibility it could be a metastatic lesion with the abnormalities in the vulvar region of her PET CT scan.  It was recommended she undergo VATS procedure with possible lobectomy.  The risks and benefits of the procedure were explained to the patient and she was agreeable to proceed.   Hospital Course:   Susan Farley presented to [facility] on 02/24/2017.  She was taken to the operating room and underwent left VATS with wedge resection of left upper lobe, completion lobectomy of left upper lobe, lymph node dissection, and insertion of ON-Q anesthetic catheter.  She tolerated the procedure without difficulty, was extubated and taken to the SICU in stale condition.  She progressed well post operatively.  She was hypertensive and was restarted on her home medications.  She had no air leak and her chest tube was transitioned to  water seal on POD #1.  She was given potassium supplementation for hypokalemia.  Her CXR remained stable with chest tube on water seal. There continued to be no air leak.  Her output was a little high to her chest tube was continued for another 24 hours.  Her chest tube was able to be removed on 02/27/2017.  She continued to make progress.  Final CXR showed no  evidence of pneumothorax.  She is ambulating without difficulty.  Her pain is well controlled.  She is medically stable for discharge home today.  Final pathology has not yet been reported.  Significant Diagnostic Studies:   Nuclear medicine:   1. 1.7 cm left upper lobe pulmonary nodule is hypermetabolic highly concerning for neoplasm. Whether this represents a primary bronchogenic carcinoma or a solitary pulmonary metastasis is uncertain. No hypermetabolic mediastinal or hilar lymphadenopathy noted. 2. Sessile skin lesion in the right vulvar region which demonstrates hypermetabolism with bilateral inguinal lymphadenopathy. Findings are concerning for potential vulvar carcinoma and clinical correlation is recommended. 3. Multiple small areas of low-level hypermetabolism in the breasts bilaterally with mildly hypermetabolic lymph node in the axillary tail of the right breast. These findings are nonspecific if, but correlation with mammography is recommended. 4. Rather diffuse hypermetabolism throughout Waldeyer's ring, most focal in the midline of the adenoids in the posterior nasopharynx. This is favored to be physiologic, as there is no definite pathologic correlate noted in these regions on recent contrast enhanced neck CT. 5. Aortic atherosclerosis, in addition to left main and 3 vessel coronary artery disease. Please note that although the presence of coronary artery calcium documents the presence of coronary artery disease, the severity of this disease and any potential stenosis cannot be assessed on this non-gated CT examination. Assessment for potential risk factor modification, dietary therapy or pharmacologic therapy may be warranted, if clinically indicated. 6. Mild cardiomegaly. 7. Fibroid uterus.  Treatments: surgery:   Left video-assisted thoracoscopy, wedge resection of left upper lobe, thoracoscopic left upper lobectomy, lymph node dissection, insertion of On-Q  local anesthetic catheter.  Disposition: 01-Home or Self Care   Discharge Medications:   Allergies as of 02/28/2017      Reactions   Shellfish Allergy Anaphylaxis      Medication List    TAKE these medications   acetaminophen 500 MG tablet Commonly known as:  TYLENOL Take 2 tablets (1,000 mg total) by mouth every 6 (six) hours as needed.   amLODipine 10 MG tablet Commonly known as:  NORVASC Take 1 tablet (10 mg total) by mouth daily.   aspirin 325 MG EC tablet Take 1 tablet (325 mg total) by mouth daily.   [employer] FIBER GUMMIES PO Take 2 capsules by mouth daily.   docusate sodium 100 MG capsule Commonly known as:  COLACE Take 200 mg by mouth at bedtime.   dorzolamide 2 % ophthalmic solution Commonly known as:  TRUSOPT Place 1 drop into both eyes 2 (two) times daily.   hydrochlorothiazide 25 MG tablet Commonly known as:  HYDRODIURIL Take 25 mg by mouth daily.   lisinopril 5 MG tablet Commonly known as:  PRINIVIL,ZESTRIL Take 1 tablet (5 mg total) by mouth daily.   metFORMIN 500 MG tablet Commonly known as:  GLUCOPHAGE Take 500 mg by mouth 2 (two) times daily with a meal.   nicotine 21 mg/24hr patch Commonly known as:  NICODERM CQ - dosed in mg/24 hours Place 1 patch (21 mg total) onto the skin daily.   oxyCODONE 5 MG immediate release  tablet Commonly known as:  Oxy IR/ROXICODONE Take 1-2 tablets (5-10 mg total) by mouth every 4 (four) hours as needed for severe pain.   OZURDEX 0.7 MG Impl Generic drug:  dexamethasone 0.7 mg by Intravitreal route every 3 (three) months.   pantoprazole 40 MG tablet Commonly known as:  PROTONIX Take 80 mg by mouth daily.   potassium chloride SA 20 MEQ tablet Commonly known as:  K-DUR,KLOR-CON Take 1 tablet (20 mEq total) by mouth daily.   QUEtiapine 25 MG tablet Commonly known as:  SEROQUEL Take 25 mg by mouth at bedtime.   rosuvastatin 20 MG tablet Commonly known as:  CRESTOR Take 20 mg by mouth every evening.    timolol 0.5 % ophthalmic solution Commonly known as:  BETIMOL Place 1 drop into both eyes 2 (two) times daily.   venlafaxine XR 37.5 MG 24 hr capsule Commonly known as:  EFFEXOR-XR Take 187.5 mg by mouth at bedtime.   Vitamin D (Ergocalciferol) 50000 units Caps capsule Commonly known as:  DRISDOL Take 50,000 Units by mouth every 7 (seven) days.            Discharge Care Instructions        Start     Ordered   02/28/17 0000  acetaminophen (TYLENOL) 500 MG tablet  Every 6 hours PRN     02/28/17 0755   02/28/17 0000  amLODipine (NORVASC) 10 MG tablet  Daily    Question:  Supervising Provider  Answer:  Melrose Nakayama   02/28/17 0755   02/28/17 0000  lisinopril (PRINIVIL,ZESTRIL) 5 MG tablet  Daily    Question:  Supervising Provider  Answer:  Melrose Nakayama   02/28/17 0755   02/28/17 0000  nicotine (NICODERM CQ - DOSED IN MG/24 HOURS) 21 mg/24hr patch  Daily    Question:  Supervising Provider  Answer:  Melrose Nakayama   02/28/17 0755   02/28/17 0000  oxyCODONE (OXY IR/ROXICODONE) 5 MG immediate release tablet  Every 4 hours PRN    Question:  Supervising Provider  Answer:  Melrose Nakayama   02/28/17 0755   02/28/17 0000  potassium chloride SA (K-DUR,KLOR-CON) 20 MEQ tablet  Daily    Question:  Supervising Provider  Answer:  Melrose Nakayama   02/28/17 3474     Follow-up Information    Melrose Nakayama, MD Follow up.   Specialty:  Cardiothoracic Surgery Why:  Appointment is at 2:45 . Please get CXR 30 min prior to your appointment at [facility] located on first floor of our office building Contact information: [phone] [place] [address] [phone]           Signed: Ellwood Handler 02/28/2017, 8:01 AM

## 2017-02-26 NOTE — Progress Notes (Signed)
2 Days Post-Op Procedure(s) (LRB): Left VIDEO ASSISTED THORACOSCOPY (VATS)/WEDGE RESECTION (Left) Left upper lobe LOBECTOMY (Left) NODE DISSECTION (Left) Subjective: Just waking up. Pain well controlled  Objective: Vital signs in last 24 hours: Temp:  [97.6 F (36.4 C)-98.4 F (36.9 C)] 98 F (36.7 C) (09/09 0752) Pulse Rate:  [65-83] 72 (09/09 0800) Cardiac Rhythm: Normal sinus rhythm (09/09 0801) Resp:  [11-22] 13 (09/09 0800) BP: (114-160)/(58-84) 157/62 (09/09 0800) SpO2:  [90 %-100 %] 93 % (09/09 0800) Weight:  [223 lb 1.7 oz (101.2 kg)] 223 lb 1.7 oz (101.2 kg) (09/09 0300)  Hemodynamic parameters for last 24 hours:    Intake/Output from previous day: 09/08 0701 - 09/09 0700 In: 2108.3 [P.O.:1080; I.V.:1028.3] Out: 2800 [Urine:2500; Chest Tube:300] Intake/Output this shift: Total I/O In: 30.8 [I.V.:30.8] Out: -   General appearance: alert, cooperative and no distress Neurologic: intact Heart: regular rate and rhythm Lungs: diminished breath sounds bibasilar and L>R Abdomen: normal findings: soft, non-tender no air leak, serous drainage  Lab Results:  Recent Labs  02/25/17 0337 02/26/17 0348  WBC 11.6* 9.2  HGB 11.2* 10.9*  HCT 36.3 34.8*  PLT 266 217   BMET:  Recent Labs  02/25/17 0337 02/26/17 0348  NA 142 140  K 3.2* 3.5  CL 107 105  CO2 29 28  GLUCOSE 144* 154*  BUN 7 8  CREATININE 0.76 0.72  CALCIUM 8.5* 8.4*    PT/INR: No results for input(s): LABPROT, INR in the last 72 hours. ABG    Component Value Date/Time   PHART 7.360 02/25/2017 0354   HCO3 26.2 02/25/2017 0354   TCO2 28 02/25/2017 0354   O2SAT 92.0 02/25/2017 0354   CBG (last 3)   Recent Labs  02/24/17 1004 02/24/17 1651 02/24/17 2022  GLUCAP 151* 160* 159*    Assessment/Plan: S/P Procedure(s) (LRB): Left VIDEO ASSISTED THORACOSCOPY (VATS)/WEDGE RESECTION (Left) Left upper lobe LOBECTOMY (Left) NODE DISSECTION (Left) Plan for transfer to step-down: see transfer  orders  Awaiting bed on 4E CV- stable RESP- no air leak but 300 ml of drainage- will leave CT one more day  Continue IS for atelectasis RENAL- creatiinine OK, hypokalemia improved but still low- supplement K ENDO_ CBG mildly elevated DVT Prophylaxis- SCD + enoxaparin AMBULATE   LOS: 2 days    Melrose Nakayama 02/26/2017

## 2017-02-27 ENCOUNTER — Encounter (HOSPITAL_COMMUNITY): Payer: Self-pay

## 2017-02-27 ENCOUNTER — Inpatient Hospital Stay (HOSPITAL_COMMUNITY): Payer: Federal, State, Local not specified - PPO

## 2017-02-27 DIAGNOSIS — R918 Other nonspecific abnormal finding of lung field: Secondary | ICD-10-CM | POA: Diagnosis not present

## 2017-02-27 DIAGNOSIS — J939 Pneumothorax, unspecified: Secondary | ICD-10-CM | POA: Diagnosis not present

## 2017-02-27 MED ORDER — ALBUTEROL SULFATE (2.5 MG/3ML) 0.083% IN NEBU
2.5000 mg | INHALATION_SOLUTION | Freq: Two times a day (BID) | RESPIRATORY_TRACT | Status: DC
  Administered 2017-02-28: 2.5 mg via RESPIRATORY_TRACT
  Filled 2017-02-27: qty 3

## 2017-02-27 MED ORDER — LISINOPRIL 5 MG PO TABS
5.0000 mg | ORAL_TABLET | Freq: Every day | ORAL | Status: DC
  Administered 2017-02-27 – 2017-02-28 (×2): 5 mg via ORAL
  Filled 2017-02-27 (×2): qty 1

## 2017-02-27 MED ORDER — POTASSIUM CHLORIDE CRYS ER 20 MEQ PO TBCR
40.0000 meq | EXTENDED_RELEASE_TABLET | Freq: Every day | ORAL | Status: DC
  Administered 2017-02-27 – 2017-02-28 (×2): 40 meq via ORAL
  Filled 2017-02-27 (×2): qty 2

## 2017-02-27 MED ORDER — POLYETHYLENE GLYCOL 3350 17 G PO PACK
17.0000 g | PACK | Freq: Every day | ORAL | Status: DC
  Administered 2017-02-27 – 2017-02-28 (×2): 17 g via ORAL
  Filled 2017-02-27 (×2): qty 1

## 2017-02-27 MED ORDER — LACTULOSE 10 GM/15ML PO SOLN: 20.0000 g | Freq: Every day | ORAL | Status: DC | PRN

## 2017-02-27 NOTE — Progress Notes (Addendum)
      [address]       [place] [address]             [phone]      3 Days Post-Op Procedure(s) (LRB): Left VIDEO ASSISTED THORACOSCOPY (VATS)/WEDGE RESECTION (Left) Left upper lobe LOBECTOMY (Left) NODE DISSECTION (Left)   Subjective:  Patient has no specific complaints.  She states her chest tube dressing was saturated.  She told nursing and they said it was fine.  + ambulation  No BM yet  Objective: Vital signs in last 24 hours: Temp:  [97.7 F (36.5 C)-99 F (37.2 C)] 97.7 F (36.5 C) (09/10 0352) Pulse Rate:  [68-81] 73 (09/10 0352) Cardiac Rhythm: Normal sinus rhythm (09/10 0708) Resp:  [11-20] 18 (09/10 0500) BP: (99-169)/(62-87) 146/84 (09/10 0352) SpO2:  [93 %-99 %] 96 % (09/10 0500) FiO2 (%):  [18 %-21 %] 18 % (09/09 1600)  Intake/Output from previous day: 09/09 0701 - 09/10 0700 In: 1089.9 [I.V.:1089.9] Out: 350 [Urine:350] Intake/Output this shift: Total I/O In: -  Out: 800 [Urine:800]  General appearance: alert, cooperative and no distress Heart: regular rate and rhythm Lungs: clear to auscultation bilaterally Abdomen: soft, non-tender; bowel sounds normal; no masses,  no organomegaly Extremities: extremities normal, atraumatic, no cyanosis or edema Wound: clean, + serous dressing, saturated dressing  Lab Results:  Recent Labs  02/25/17 0337 02/26/17 0348  WBC 11.6* 9.2  HGB 11.2* 10.9*  HCT 36.3 34.8*  PLT 266 217   BMET:  Recent Labs  02/25/17 0337 02/26/17 0348  NA 142 140  K 3.2* 3.5  CL 107 105  CO2 29 28  GLUCOSE 144* 154*  BUN 7 8  CREATININE 0.76 0.72  CALCIUM 8.5* 8.4*    PT/INR: No results for input(s): LABPROT, INR in the last 72 hours. ABG    Component Value Date/Time   PHART 7.360 02/25/2017 0354   HCO3 26.2 02/25/2017 0354   TCO2 28 02/25/2017 0354   O2SAT 92.0 02/25/2017 0354   CBG (last 3)   Recent Labs  02/24/17 1004 02/24/17 1651 02/24/17 2022  GLUCAP 151* 160* 159*     Assessment/Plan: S/P Procedure(s) (LRB): Left VIDEO ASSISTED THORACOSCOPY (VATS)/WEDGE RESECTION (Left) Left upper lobe LOBECTOMY (Left) NODE DISSECTION (Left)  1. Chest tube- some tidaling, no definitive air leak... CXR with trace apical pneumothorax which is new from yesterday... Possibly remove tube today, will speak with Dr. Roxan Hockey 2. Pulm- off oxygen, no acute issues continue IS 3. CV- NSR, remains hypertensive on Norvasc, HCTZ, will start low dose ACE 4. GI- constipation, will order Miralax per patients request, add lactulose 5. Renal- creatinine stable, potassium remains low at 3.5, continue supplementation 6. Dispo- patient stable, will add Miralax/Lactulose prn, start low dose ACE for HTN, possible remove chest tube will speak with Dr.  Roxan Hockey   LOS: 3 days    BARRETT, Junie Panning 02/27/2017 Patient seen and examined, agree with above No air leak, minimal drainage, CXR OK- dc chest tube Possibly home in AM  [place] C. Roxan Hockey, MD [facility] [phone]

## 2017-02-27 NOTE — Progress Notes (Signed)
Patient's left chest tube removed as per MD orders without difficulty.  Will continue to monitor.

## 2017-02-27 NOTE — Care Management Note (Signed)
Case Management Note Marvetta Gibbons RN, BSN Unit 4E-Case Manager [phone]  Patient Details  Name: Susan Farley MRN: [id number] Date of Birth: [birth year]  Subjective/Objective:   Pt admitted s/p VATS/lobectomy                 Action/Plan: PTA pt; lived at home with daughter- CM to follow for d/c needs- anticipate return home  Expected Discharge Date:                  Expected Discharge Plan:  Home/Self Care  In-House Referral:     Discharge planning Services  CM Consult  Post Acute Care Choice:    Choice offered to:     DME Arranged:    DME Agency:     HH Arranged:    HH Agency:     Status of Service:  In process, will continue to follow  If discussed at Long Length of Stay Meetings, dates discussed:    Discharge Disposition:   Additional Comments:  Dawayne Patricia, RN 02/27/2017, 10:02 AM

## 2017-02-28 ENCOUNTER — Inpatient Hospital Stay (HOSPITAL_COMMUNITY): Payer: Federal, State, Local not specified - PPO

## 2017-02-28 DIAGNOSIS — J939 Pneumothorax, unspecified: Secondary | ICD-10-CM | POA: Diagnosis not present

## 2017-02-28 LAB — BASIC METABOLIC PANEL
ANION GAP: 6 (ref 5–15)
BUN: 9 mg/dL (ref 6–20)
CALCIUM: 10.1 mg/dL (ref 8.9–10.3)
CO2: 31 mmol/L (ref 22–32)
CREATININE: 0.74 mg/dL (ref 0.44–1.00)
Chloride: 102 mmol/L (ref 101–111)
GFR calc non Af Amer: >60 mL/min (ref >60)
Glucose, Bld: 153 mg/dL — ABNORMAL HIGH (ref 65–99)
Potassium: 3.8 mmol/L (ref 3.5–5.1)
SODIUM: 139 mmol/L (ref 135–145)

## 2017-02-28 MED ORDER — ACETAMINOPHEN 500 MG PO TABS: 1000.0000 mg | ORAL_TABLET | Freq: Four times a day (QID) | ORAL | 0 refills | Status: DC | PRN

## 2017-02-28 MED ORDER — LISINOPRIL 5 MG PO TABS: 5.0000 mg | ORAL_TABLET | Freq: Every day | ORAL | 3 refills | Status: DC

## 2017-02-28 MED ORDER — AMLODIPINE BESYLATE 10 MG PO TABS: 10.0000 mg | ORAL_TABLET | Freq: Every day | ORAL | 3 refills | Status: DC

## 2017-02-28 MED ORDER — NICOTINE 21 MG/24HR TD PT24: 21.0000 mg | MEDICATED_PATCH | Freq: Every day | TRANSDERMAL | 3 refills | Status: DC

## 2017-02-28 MED ORDER — POTASSIUM CHLORIDE CRYS ER 20 MEQ PO TBCR: 20.0000 meq | EXTENDED_RELEASE_TABLET | Freq: Every day | ORAL | 3 refills | Status: DC

## 2017-02-28 MED ORDER — OXYCODONE HCL 5 MG PO TABS: 5.0000 mg | ORAL_TABLET | ORAL | 0 refills | Status: DC | PRN

## 2017-02-28 NOTE — Care Management Note (Signed)
Case Management Note Marvetta Gibbons RN, BSN Unit 4E-Case Manager [phone]  Patient Details  Name: Susan Farley MRN: [id number] Date of Birth: 12-21-1960  Subjective/Objective:   Pt admitted s/p VATS/lobectomy                 Action/Plan: PTA pt; lived at home with daughter- CM to follow for d/c needs- anticipate return home  Expected Discharge Date:  02/28/17               Expected Discharge Plan:  Home/Self Care  In-House Referral:  NA  Discharge planning Services  CM Consult  Post Acute Care Choice:  NA Choice offered to:  NA  DME Arranged:    DME Agency:     HH Arranged:    [place] Agency:     Status of Service:  Completed, signed off  If discussed at [place] of Stay Meetings, dates discussed:    Discharge Disposition: home/self care   Additional Comments:  02/28/17- 1000- Marvetta Gibbons RN, CM- pt for d/c home today- no CM needs noted for discharge.   Dawayne Patricia, RN 02/28/2017, 10:05 AM

## 2017-02-28 NOTE — Progress Notes (Addendum)
      [address]       [place] [address]             [phone]      4 Days Post-Op Procedure(s) (LRB): Left VIDEO ASSISTED THORACOSCOPY (VATS)/WEDGE RESECTION (Left) Left upper lobe LOBECTOMY (Left) NODE DISSECTION (Left)   Subjective:  No complaints.  Seems drowsy this morning.  + ambulation   + BM  Objective: Vital signs in last 24 hours: Temp:  [98.6 F (37 C)-99 F (37.2 C)] 98.6 F (37 C) (09/11 0414) Pulse Rate:  [77] 77 (09/11 0414) Cardiac Rhythm: Normal sinus rhythm (09/11 0700) Resp:  [16-20] 18 (09/11 0414) BP: (120-135)/(78-82) 120/78 (09/11 0414) SpO2:  [92 %-98 %] 98 % (09/11 0414)  Intake/Output from previous day: 09/10 0701 - 09/11 0700 In: 600 [P.O.:600] Out: 800 [Urine:800]  General appearance: alert, cooperative and no distress Heart: regular rate and rhythm Lungs: clear to auscultation bilaterally Abdomen: soft, non-tender; bowel sounds normal; no masses,  no organomegaly Extremities: extremities normal, atraumatic, no cyanosis or edema Wound: clean and dry  Lab Results:  Recent Labs  02/26/17 0348  WBC 9.2  HGB 10.9*  HCT 34.8*  PLT 217   BMET:  Recent Labs  02/26/17 0348 02/28/17 0224  NA 140 139  K 3.5 3.8  CL 105 102  CO2 28 31  GLUCOSE 154* 153*  BUN 8 9  CREATININE 0.72 0.74  CALCIUM 8.4* 10.1    PT/INR: No results for input(s): LABPROT, INR in the last 72 hours. ABG    Component Value Date/Time   PHART 7.360 02/25/2017 0354   HCO3 26.2 02/25/2017 0354   TCO2 28 02/25/2017 0354   O2SAT 92.0 02/25/2017 0354   CBG (last 3)  No results for input(s): GLUCAP in the last 72 hours.  Assessment/Plan: S/P Procedure(s) (LRB): Left VIDEO ASSISTED THORACOSCOPY (VATS)/WEDGE RESECTION (Left) Left upper lobe LOBECTOMY (Left) NODE DISSECTION (Left)  1. Chest tube- removed yesterday, follow up CXR shows no pneumothorax 2. CV-NSR, BP improved.. Continue HCTZ, Norvasc, lisinopril 3. GI- patient moved  bowels yesterday, encouraged to continue Miralax daily if she has further issues with constipation 4. Renal-creatinine remains stable, K is at 3.8, continue low dose potassium supplement at discharge as she is on diuretic  5. Dispo- patient stable, will d/c home today   LOS: 4 days    BARRETT, ERIN 02/28/2017 Patient seen and examined, agree with above Path T1N0- stage IA  Home today  Revonda Standard. Roxan Hockey, MD [facility] [phone]

## 2017-02-28 NOTE — Discharge Instructions (Signed)
Please establish primary care physician   Video-Assisted Thoracic Surgery, Care After This sheet gives you information about how to care for yourself after your procedure. Your health care provider may also give you more specific instructions. If you have problems or questions, contact your health care provider. What can I expect after the procedure? After the procedure, it is common to have:  Some pain and soreness in your chest.  Pain when breathing in (inhaling) and coughing.  Constipation.  Fatigue.  Difficulty sleeping.  Follow these instructions at home: Preventing pneumonia  Take deep breaths or do breathing exercises as instructed by your health care provider. Doing this helps prevent lung infection (pneumonia).  Cough frequently. Coughing may cause discomfort, but it is important to clear mucus (phlegm) and expand your lungs. If it hurts to cough, hold a pillow against your chest or place the palms of both hands on top of the incision (use splinting) when you cough. This may help relieve discomfort.  If you were given an incentive spirometer, use it as directed. An incentive spirometer is a tool that measures how well you are filling your lungs with each breath.  Participate in pulmonary rehabilitation as directed by your health care provider. This is a program that combines education, exercise, and support from a team of specialists. The goal is to help you heal and get back to your normal activities as soon as possible. Medicines  Take over-the-counter or prescription medicines only as told by your health care provider.  If you have pain, take pain-relieving medicine before your pain becomes severe. This is important because if your pain is under control, you will be able to breathe and cough more comfortably.  If you were prescribed an antibiotic medicine, take it as told by your health care provider. Do not stop taking the antibiotic even if you start to feel  better. Activity  Ask your health care provider what activities are safe for you.  Avoid activities that use your chest muscles for at least 3-4 weeks.  Do not lift anything that is heavier than 10 lb (4.5 kg), or the limit that your health care provider tells you, until he or she says that it is safe. Incision care  Follow instructions from your health care provider about how to take care of your incision(s). Make sure you: ? Wash your hands with soap and water before you change your bandage (dressing). If soap and water are not available, use hand sanitizer. ? Change your dressing as told by your health care provider. ? Leave stitches (sutures), skin glue, or adhesive strips in place. These skin closures may need to stay in place for 2 weeks or longer. If adhesive strip edges start to loosen and curl up, you may trim the loose edges. Do not remove adhesive strips completely unless your health care provider tells you to do that.  Keep your dressing dry until it has been removed.  Check your incision area every day for signs of infection. Check for: ? Redness, swelling, or pain. ? Fluid or blood. ? Warmth. ? Pus or a bad smell. Bathing  Do not take baths, swim, or use a hot tub until your health care provider approves. You may take showers.  After your dressing has been removed, use soap and water to gently wash your incision area. Do not use anything else to clean your incision(s) unless your health care provider tells you to do this. Driving  Do not drive until your health care  provider approves.  Do not drive or use heavy machinery while taking prescription pain medicine. Eating and drinking  Eat a healthy, balanced diet as instructed by your health care provider. A healthy diet includes plenty of fresh fruits and vegetables, whole grains, and low-fat (lean) proteins.  Limit foods that are high in fat and processed sugars, such as fried and sweet foods.  Drink enough fluid to  keep your urine clear or pale yellow. General instructions  To prevent or treat constipation while you are taking prescription pain medicine, your health care provider may recommend that you: ? Take over-the-counter or prescription medicines. ? Eat foods that are high in fiber, such as beans, fresh fruits and vegetables, and whole grains.  Do not use any products that contain nicotine or tobacco, such as cigarettes and e-cigarettes. If you need help quitting, ask your health care provider.  Avoid secondhand smoke.  Wear compression stockings as told by your health care provider. These stockings help to prevent blood clots and reduce swelling in your legs.  If you have a chest tube, care for it as instructed by your health care provider. Do not travel by airplane during the 2 weeks after your chest tube is removed, or until your health care provider says that this is safe.  Keep all follow-up visits as told by your health care provider. This is important. Contact a health care provider if:  You have redness, swelling, or pain around an incision.  You have fluid or blood coming from an incision.  Your incision area feels warm to the touch.  You have pus or a bad smell coming from an incision.  You have a fever or chills.  You have nausea or vomiting.  You have pain that does not get better with medicine. Get help right away if:  You have chest pain.  Your heart is fluttering or beating rapidly.  You develop a rash.  You have shortness of breath or trouble breathing.  You are confused.  You have trouble speaking.  You feel weak, light-headed, or dizzy.  You faint. Summary  To help prevent lung infection (pneumonia), take deep breaths or do breathing exercises as instructed by your health care provider.  Cough frequently to clear mucus (phlegm) and expand your lungs. If it hurts to cough, hold a pillow against your chest or place the palms of both hands on top of the  incision (use splinting) when you cough.  If you have pain, take pain-relieving medicine before your pain becomes severe. This is important because if your pain is under control, you will be able to breathe and cough more comfortably.  Ask your health care provider what activities are safe for you. This information is not intended to replace advice given to you by your health care provider. Make sure you discuss any questions you have with your health care provider. Document Released: 10/01/2012 Document Revised: 05/16/2016 Document Reviewed: 05/16/2016 Elsevier Interactive Patient Education  2017 [employer].

## 2017-02-28 NOTE — Progress Notes (Signed)
Fentanyl PCA discontinued and remaining 9 ml's in syringe was wasted in sharps with Grant Fontana, RN.

## 2017-03-02 ENCOUNTER — Telehealth: Payer: Self-pay

## 2017-03-02 NOTE — Telephone Encounter (Signed)
Patient called C/O left breast pain. She states that she is having to take oxycodone 5 mg every 3 hours for relieve. She denies any other issues and states that she is "doing ok" other then the left breast pain. She has not been taking the prn tylenol that was directed at hospital discharge.  She is ( s/p LT VATS, wedge resection,left upper lobectomy.02/24/17) I told her this type of pain was normal after surgery. And recommend that she add the tylenol 500 mg every 6 hours prn for pain and try to take less of the oxycodone. She agreed and will call back to office if their is no improvement.

## 2017-03-13 ENCOUNTER — Other Ambulatory Visit: Payer: Self-pay | Admitting: Thoracic Surgery (Cardiothoracic Vascular Surgery)

## 2017-03-13 DIAGNOSIS — Z902 Acquired absence of lung [part of]: Secondary | ICD-10-CM

## 2017-03-14 ENCOUNTER — Ambulatory Visit (INDEPENDENT_AMBULATORY_CARE_PROVIDER_SITE_OTHER): Payer: Self-pay | Admitting: Thoracic Surgery (Cardiothoracic Vascular Surgery)

## 2017-03-14 ENCOUNTER — Encounter: Payer: Self-pay | Admitting: Thoracic Surgery (Cardiothoracic Vascular Surgery)

## 2017-03-14 ENCOUNTER — Ambulatory Visit
Admission: RE | Admit: 2017-03-14 | Discharge: 2017-03-14 | Disposition: A | Discharge status: Home / Self Care | Payer: Federal, State, Local not specified - PPO | Source: Ambulatory Visit | Attending: Thoracic Surgery (Cardiothoracic Vascular Surgery) | Admitting: Thoracic Surgery (Cardiothoracic Vascular Surgery)

## 2017-03-14 VITALS — BP 111/71 | HR 73 | Resp 18 | Ht 64.0 in | Wt 215.2 lb

## 2017-03-14 DIAGNOSIS — C3412 Malignant neoplasm of upper lobe, left bronchus or lung: Secondary | ICD-10-CM

## 2017-03-14 DIAGNOSIS — Z902 Acquired absence of lung [part of]: Secondary | ICD-10-CM

## 2017-03-14 DIAGNOSIS — J9811 Atelectasis: Secondary | ICD-10-CM | POA: Diagnosis not present

## 2017-03-14 NOTE — Progress Notes (Signed)
[address]       [place] [address]             [phone]       HPI: Susan Farley returns for a scheduled follow-up visit  Susan Farley is a 56 year old woman with a history of tobacco abuse who underwent a thoracoscopic left upper lobectomy for stage IA squamous cell carcinoma on 02/24/2017. Her postoperative course was uncomplicated and she went home on day 4.  She complains of feeling sore and having a lack of energy. She says she gets tired very easily. She took oxycodone frequently when she first got home but hasn't used it over the past week. She denies any shortness of breath or swelling in her legs. She drove to the office today and did not have any difficulties with that. She has not smoked since her surgery. She is currently using the 14 mg a day nicotine patch. She says it is very difficult.  Past Medical History:  Diagnosis Date  . Asthma   . Colitis   . Depression   . Diabetes mellitus without complication ([place])   . GERD (gastroesophageal reflux disease)   . Heart murmur   . Hypercholesteremia   . Hypertension   . Retinitis pigmentosa   . Sleep apnea    doesnt wear BIPAP currently  . Stroke [facility]) Nov. 2015   no residual    Current Outpatient Prescriptions  Medication Sig Dispense Refill  . acetaminophen (TYLENOL) 500 MG tablet Take 2 tablets (1,000 mg total) by mouth every 6 (six) hours as needed. 30 tablet 0  . amLODipine (NORVASC) 10 MG tablet Take 1 tablet (10 mg total) by mouth daily. 30 tablet 3  . aspirin EC 325 MG EC tablet Take 1 tablet (325 mg total) by mouth daily. 30 tablet 0  . [employer] FIBER GUMMIES PO Take 2 capsules by mouth daily.    Marland Kitchen dexamethasone (OZURDEX) 0.7 MG IMPL 0.7 mg by Intravitreal route every 3 (three) months.     . docusate sodium (COLACE) 100 MG capsule Take 200 mg by mouth at bedtime.     . dorzolamide (TRUSOPT) 2 % ophthalmic solution Place 1 drop into both eyes 2 (two) times daily.    . hydrochlorothiazide  (HYDRODIURIL) 25 MG tablet Take 25 mg by mouth daily.    Marland Kitchen lisinopril (PRINIVIL,ZESTRIL) 5 MG tablet Take 1 tablet (5 mg total) by mouth daily. 30 tablet 3  . metFORMIN (GLUCOPHAGE) 500 MG tablet Take 500 mg by mouth 2 (two) times daily with a meal.    . nicotine (NICODERM CQ - DOSED IN MG/24 HOURS) 21 mg/24hr patch Place 1 patch (21 mg total) onto the skin daily. 28 patch 3  . pantoprazole (PROTONIX) 40 MG tablet Take 80 mg by mouth daily.     . potassium chloride SA (K-DUR,KLOR-CON) 20 MEQ tablet Take 1 tablet (20 mEq total) by mouth daily. 30 tablet 3  . QUEtiapine (SEROQUEL) 25 MG tablet Take 25 mg by mouth at bedtime.    . rosuvastatin (CRESTOR) 20 MG tablet Take 20 mg by mouth every evening.    . timolol (BETIMOL) 0.5 % ophthalmic solution Place 1 drop into both eyes 2 (two) times daily.    Marland Kitchen venlafaxine XR (EFFEXOR-XR) 37.5 MG 24 hr capsule Take 187.5 mg by mouth at bedtime.    . Vitamin D, Ergocalciferol, (DRISDOL) 50000 UNITS CAPS capsule Take 50,000 Units by mouth every 7 (seven) days.    Marland Kitchen  oxyCODONE (OXY IR/ROXICODONE) 5 MG immediate release tablet Take 1-2 tablets (5-10 mg total) by mouth every 4 (four) hours as needed for severe pain. (Patient not taking: Reported on 03/14/2017) 30 tablet 0   No current facility-administered medications for this visit.     Physical Exam BP 111/71 (BP Location: Right Arm, Patient Position: Sitting, Cuff Size: Large)   Pulse 73   Resp 18   Ht 5\' 4"  (1.626 m)   Wt 215 lb 3.2 oz (97.6 kg)   LMP 11/25/2011   SpO2 97% Comment: ON RA  BMI 36.6 kg/m  56 year old woman in no acute distress Alert and oriented 3 with no focal deficits Lungs diminished at left base, otherwise clear Incisions healing well  Diagnostic Tests: CHEST  2 VIEW  COMPARISON:  02/28/2017, PET-CT 01/06/2017  FINDINGS: The right lung is clear. Resolution of previously noted small left pneumothorax. Left lung base atelectasis with slight elevation of the left diaphragm.  Mild volume loss on the left consistent with history of lobectomy. Normal heart size.  IMPRESSION: 1. Resolution of previously noted small left pneumothorax. 2. Postsurgical changes on the left. Mild residual atelectasis at the left lung base.   Electronically Signed   By: Donavan Foil M.D.   On: 03/14/2017 14:32 I personally reviewed the chest x-ray and concur with the findings noted above.  Impression: Susan Farley is a 56 year old woman who had a thoracoscopic left upper lobectomy for stage IA squamous cell carcinoma about 2-1/2 weeks ago. Her postoperative course was uncomplicated and she has continued to do well since she has been home.  Smoking cessation instruction/counseling given:  commended patient for quitting and reviewed strategies for preventing relapses   She may drive, appropriate precautions were discussed. There are no restrictions on her activities, such as lifting, but she was cautioned to build into new activities gradually to avoid undue pain.  Plan: Refer to [facility] for oncology consultation  Return in 2 months with PA and lateral chest x-ray  Susan Nakayama, MD [facility] [phone]

## 2017-03-21 ENCOUNTER — Telehealth: Payer: Self-pay | Admitting: *Deleted

## 2017-03-21 NOTE — Telephone Encounter (Signed)
Oncology Nurse Navigator Documentation  Oncology Nurse Navigator Flowsheets 03/21/2017  Navigator Location [facility]  Referral date to RadOnc/MedOnc 03/14/2017  Navigator Encounter Type Telephone/I received referral on Susan Farley. I called to schedule her to be seen. I was unable to reach her or leave a vm message.   Telephone Outgoing Call  Barriers/Navigation Needs Coordination of Care  Interventions Coordination of Care  Coordination of Care Appts  Acuity Level 1  Time Spent with Patient 15

## 2017-03-22 ENCOUNTER — Telehealth: Payer: Self-pay | Admitting: *Deleted

## 2017-03-22 NOTE — Telephone Encounter (Signed)
Oncology Nurse Navigator Documentation  Oncology Nurse Navigator Flowsheets 03/22/2017  Navigator Location [facility]  Navigator Encounter Type Telephone/I received referral on Ms. Duffner. I called to schedule her to be seen but was unable to reach. I left her a vm message with my name and phone number.   Telephone Outgoing Call  Barriers/Navigation Needs Coordination of Care  Interventions Coordination of Care  Coordination of Care Appts  Acuity Level 1  Time Spent with Patient 15

## 2017-03-24 ENCOUNTER — Telehealth: Payer: Self-pay | Admitting: *Deleted

## 2017-03-24 DIAGNOSIS — R911 Solitary pulmonary nodule: Secondary | ICD-10-CM | POA: Insufficient documentation

## 2017-03-24 NOTE — Telephone Encounter (Signed)
Oncology Nurse Navigator Documentation  Oncology Nurse Navigator Flowsheets 03/24/2017  Navigator Location [facility]  Navigator Encounter Type Telephone/I called patient to schedule her to be seen with Dr. Julien Nordmann. I spoke with patient and gave her the appt time and place.  She verbalized understanding of appt time and place.   Telephone Outgoing Call  Barriers/Navigation Needs Coordination of Care  Interventions Coordination of Care  Coordination of Care Appts  Acuity Level 1  Time Spent with Patient 15

## 2017-03-29 ENCOUNTER — Other Ambulatory Visit: Payer: Self-pay | Admitting: *Deleted

## 2017-03-29 DIAGNOSIS — G8918 Other acute postprocedural pain: Secondary | ICD-10-CM

## 2017-03-29 MED ORDER — OXYCODONE HCL 5 MG PO TABS: 5.0000 mg | ORAL_TABLET | ORAL | 0 refills | Status: DC | PRN

## 2017-03-29 MED ORDER — HYDROCODONE-ACETAMINOPHEN 5-325 MG PO TABS: 1.0000 | ORAL_TABLET | Freq: Four times a day (QID) | ORAL | 0 refills | Status: DC | PRN

## 2017-04-11 ENCOUNTER — Telehealth: Payer: Self-pay | Admitting: Internal Medicine

## 2017-04-11 ENCOUNTER — Encounter: Payer: Self-pay | Admitting: Internal Medicine

## 2017-04-11 ENCOUNTER — Other Ambulatory Visit (HOSPITAL_BASED_OUTPATIENT_CLINIC_OR_DEPARTMENT_OTHER): Payer: Federal, State, Local not specified - PPO

## 2017-04-11 ENCOUNTER — Ambulatory Visit (HOSPITAL_BASED_OUTPATIENT_CLINIC_OR_DEPARTMENT_OTHER): Payer: Federal, State, Local not specified - PPO | Admitting: Internal Medicine

## 2017-04-11 DIAGNOSIS — C3412 Malignant neoplasm of upper lobe, left bronchus or lung: Secondary | ICD-10-CM

## 2017-04-11 DIAGNOSIS — R911 Solitary pulmonary nodule: Secondary | ICD-10-CM

## 2017-04-11 DIAGNOSIS — C3492 Malignant neoplasm of unspecified part of left bronchus or lung: Secondary | ICD-10-CM

## 2017-04-11 DIAGNOSIS — C3490 Malignant neoplasm of unspecified part of unspecified bronchus or lung: Secondary | ICD-10-CM

## 2017-04-11 DIAGNOSIS — R590 Localized enlarged lymph nodes: Secondary | ICD-10-CM

## 2017-04-11 LAB — CBC WITH DIFFERENTIAL/PLATELET
BASO%: 0.5 % (ref 0.0–2.0)
BASOS ABS: 0.1 10*3/uL (ref 0.0–0.1)
EOS ABS: 0.3 10*3/uL (ref 0.0–0.5)
EOS%: 3 % (ref 0.0–7.0)
HEMATOCRIT: 38.2 % (ref 34.8–46.6)
HEMOGLOBIN: 12.5 g/dL (ref 11.6–15.9)
LYMPH#: 2.5 10*3/uL (ref 0.9–3.3)
LYMPH%: 24.8 % (ref 14.0–49.7)
MCH: 28.6 pg (ref 25.1–34.0)
MCHC: 32.8 g/dL (ref 31.5–36.0)
MCV: 87.1 fL (ref 79.5–101.0)
MONO#: 0.6 10*3/uL (ref 0.1–0.9)
MONO%: 5.5 % (ref 0.0–14.0)
NEUT#: 6.8 10*3/uL — ABNORMAL HIGH (ref 1.5–6.5)
NEUT%: 66.2 % (ref 38.4–76.8)
Platelets: 296 10*3/uL (ref 145–400)
RBC: 4.39 10*6/uL (ref 3.70–5.45)
RDW: 16.2 % — AB (ref 11.2–14.5)
WBC: 10.2 10*3/uL (ref 3.9–10.3)

## 2017-04-11 LAB — COMPREHENSIVE METABOLIC PANEL
ALBUMIN: 3.8 g/dL (ref 3.5–5.0)
ALK PHOS: 87 U/L (ref 40–150)
ALT: 16 U/L (ref 0–55)
ANION GAP: 9 meq/L (ref 3–11)
AST: 21 U/L (ref 5–34)
BUN: 11.4 mg/dL (ref 7.0–26.0)
CHLORIDE: 103 meq/L (ref 98–109)
CO2: 29 mEq/L (ref 22–29)
Calcium: 10.3 mg/dL (ref 8.4–10.4)
Creatinine: 0.9 mg/dL (ref 0.6–1.1)
Glucose: 128 mg/dl (ref 70–140)
POTASSIUM: 3.7 meq/L (ref 3.5–5.1)
Sodium: 141 mEq/L (ref 136–145)
Total Bilirubin: 0.3 mg/dL (ref 0.20–1.20)
Total Protein: 7.9 g/dL (ref 6.4–8.3)

## 2017-04-11 NOTE — Telephone Encounter (Signed)
Gave avs and calendar for December  °

## 2017-04-11 NOTE — Progress Notes (Signed)
Susan Farley [phone]   [phone]  CONSULT NOTE  REFERRING PHYSICIAN: Dr. Modesto Charon.  REASON FOR CONSULTATION:  56 years old African-American female recently diagnosed with lung cancer.  HPI Susan Farley is a 56 y.o. female was past medical history significant for multiple medical problems including history of hypertension, diabetes mellitus, depression, GERD, colitis, sleep apnea, dyslipidemia, asthma as well as history of stroke in 2015. The patient also has a long history of smoking.She was followed by Dr. Donnetta Hutching for the carotid artery stenosis and she had CT of the head and neck performed on 12/12/2016 that showed new left upper lobe 1.6 lung nodule suspicious for primary bronchogenic carcinoma. She had a PET scan performed on 01/06/2017 and it showed 1.7 cm left upper lobe pulmonary nodule that is hypermetabolic and highly concerning for neoplasm. There was no hypermetabolic mediastinal or hilar lymphadenopathy noted. 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 The patient underwent ultrasound guided core biopsy LC right inguinal lymph node and the final pathology was consistent with reactive lymphoid hyperplasia. She was evaluated by Dr. Everitt Amber for this problem.  The patient was referred to Dr. Roxan Hockey and on 02/24/2017 she underwent left VATS with left upper lobectomy and lymph node dissection. The final pathology ([id number]) showed invasive squamous cell carcinoma, moderately differentiated his spanning 2.1 cm. The final resection margins were negative for malignancy. There was no evidence for pleural or lymphovascular invasion. The mediastinal lymph nodes were negative for malignancy. Dr. Roxan Hockey kindly referred the patient to me today for evaluation and recommendation regarding her  condition. When seen today she is feeling fine with no specific complaints except for the residual soreness in the left side of the chest after surgical resection. She denied having any significant shortness breath, cough or hemoptysis. She denied having any weight loss or night sweats. She has occasional mild nausea with no vomiting She denied having any headache or visual changes. The patient is adopted but recently found that her biological father had lung cancer. She is single and has no children. Her first of kin is her Susan Farley. She has a history of smoking up to 2 pack per day for 40 years and she quit just before her surgical resection. She denied having any history of alcohol or drug abuse. She is currently retired and used to work for the [employer].  HPI  Past Medical History:  Diagnosis Date  . Asthma   . Colitis   . Depression   . Diabetes mellitus without complication ([place])   . GERD (gastroesophageal reflux disease)   . Heart murmur   . Hypercholesteremia   . Hypertension   . Retinitis pigmentosa   . Sleep apnea    doesnt wear BIPAP currently  . Stroke [facility]) Nov. 2015   no residual    Past Surgical History:  Procedure Laterality Date  . BONE GRAFT HIP ILIAC CREST    . CATARACT EXTRACTION    . IUD REMOVAL    . LOBECTOMY Left 02/24/2017   Procedure: Left upper lobe LOBECTOMY;  Surgeon: Melrose Nakayama, MD;  Location: [place];  Service: Thoracic;  Laterality: Left;  . NODE DISSECTION Left 02/24/2017   Procedure: NODE DISSECTION;  Surgeon: Melrose Nakayama, MD;  Location: [place];  Service: Thoracic;  Laterality: Left;  . OOPHORECTOMY Right 2004   removal of cyst and ovary  .  RETINAL CRYOABLATION Bilateral    patient unsure of whether she had this  . VIDEO ASSISTED THORACOSCOPY (VATS)/WEDGE RESECTION Left 02/24/2017   Procedure: Left VIDEO ASSISTED THORACOSCOPY (VATS)/WEDGE RESECTION;  Surgeon: Melrose Nakayama, MD;  Location: [facility] OR;  Service:  Thoracic;  Laterality: Left;    Family History  Problem Relation Age of Onset  . Adopted: Yes  . Diabetes Unknown        BIOLOGICAL AUNT  . Heart disease Unknown        BIOLOGICAL AUNT    Social History Social History  Substance Use Topics  . Smoking status: Former Smoker    Packs/day: 1.00    Years: 40.00    Types: Cigarettes, E-cigarettes    Quit date: 02/28/2017  . Smokeless tobacco: Never Used     Comment: 1 1/2 pk per day plus vaporized cigarette.   . Alcohol use 0.0 oz/week     Comment: occasional beer    Allergies  Allergen Reactions  . Shellfish Allergy Anaphylaxis    Current Outpatient Prescriptions  Medication Sig Dispense Refill  . acetaminophen (TYLENOL) 500 MG tablet Take 2 tablets (1,000 mg total) by mouth every 6 (six) hours as needed. 30 tablet 0  . amLODipine (NORVASC) 10 MG tablet Take 1 tablet (10 mg total) by mouth daily. 30 tablet 3  . aspirin EC 325 MG EC tablet Take 1 tablet (325 mg total) by mouth daily. 30 tablet 0  . [employer] FIBER GUMMIES PO Take 2 capsules by mouth daily.    Marland Kitchen dexamethasone (OZURDEX) 0.7 MG IMPL 0.7 mg by Intravitreal route every 3 (three) months.     . docusate sodium (COLACE) 100 MG capsule Take 200 mg by mouth at bedtime.     . dorzolamide (TRUSOPT) 2 % ophthalmic solution Place 1 drop into both eyes 2 (two) times daily.    . hydrochlorothiazide (HYDRODIURIL) 25 MG tablet Take 25 mg by mouth daily.    Marland Kitchen HYDROcodone-acetaminophen (NORCO/VICODIN) 5-325 MG tablet Take 1 tablet by mouth every 6 (six) hours as needed. 30 tablet 0  . lisinopril (PRINIVIL,ZESTRIL) 5 MG tablet Take 1 tablet (5 mg total) by mouth daily. 30 tablet 3  . metFORMIN (GLUCOPHAGE) 500 MG tablet Take 500 mg by mouth 2 (two) times daily with a meal.    . nicotine (NICODERM CQ - DOSED IN MG/24 HOURS) 21 mg/24hr patch Place 1 patch (21 mg total) onto the skin daily. 28 patch 3  . pantoprazole (PROTONIX) 40 MG tablet Take 80 mg by mouth daily.     . potassium  chloride SA (K-DUR,KLOR-CON) 20 MEQ tablet Take 1 tablet (20 mEq total) by mouth daily. 30 tablet 3  . QUEtiapine (SEROQUEL) 25 MG tablet Take 25 mg by mouth at bedtime.    . rosuvastatin (CRESTOR) 20 MG tablet Take 20 mg by mouth every evening.    . timolol (BETIMOL) 0.5 % ophthalmic solution Place 1 drop into both eyes 2 (two) times daily.    Marland Kitchen venlafaxine XR (EFFEXOR-XR) 37.5 MG 24 hr capsule Take 187.5 mg by mouth at bedtime.    . Vitamin D, Ergocalciferol, (DRISDOL) 50000 UNITS CAPS capsule Take 50,000 Units by mouth every 7 (seven) days.     No current facility-administered medications for this visit.     Review of Systems  Constitutional: negative Eyes: negative Ears, nose, mouth, throat, and face: negative Respiratory: positive for pleurisy/chest pain Cardiovascular: negative Gastrointestinal: negative Genitourinary:negative Integument/breast: negative Hematologic/lymphatic: negative Musculoskeletal:negative Neurological: negative Behavioral/Psych: negative Endocrine: negative  Allergic/Immunologic: negative  Physical Exam  SNK:NLZJQ, healthy, no distress, well nourished and well developed SKIN: skin color, texture, turgor are normal, no rashes or significant lesions HEAD: Normocephalic, No masses, lesions, tenderness or abnormalities EYES: normal, PERRLA, Conjunctiva are pink and non-injected EARS: External ears normal, Canals clear OROPHARYNX:no exudate, no erythema and lips, buccal mucosa, and tongue normal  NECK: supple, no adenopathy, no JVD LYMPH:  no palpable lymphadenopathy, no hepatosplenomegaly BREAST:not examined LUNGS: clear to auscultation , and palpation HEART: regular rate & rhythm, no murmurs and no gallops ABDOMEN:abdomen soft, non-tender, normal bowel sounds and no masses or organomegaly BACK: Back symmetric, no curvature., No CVA tenderness EXTREMITIES:no joint deformities, effusion, or inflammation, no edema, no skin discoloration  NEURO: alert &  oriented x 3 with fluent speech, no focal motor/sensory deficits  PERFORMANCE STATUS: ECOG 1  LABORATORY DATA: Lab Results  Component Value Date   WBC 10.2 04/11/2017   HGB 12.5 04/11/2017   HCT 38.2 04/11/2017   MCV 87.1 04/11/2017   PLT 296 04/11/2017      Chemistry      Component Value Date/Time   NA 141 04/11/2017 1044   K 3.7 04/11/2017 1044   CL 102 02/28/2017 0224   CO2 29 04/11/2017 1044   BUN 11.4 04/11/2017 1044   CREATININE 0.9 04/11/2017 1044      Component Value Date/Time   CALCIUM 10.3 04/11/2017 1044   ALKPHOS 87 04/11/2017 1044   AST 21 04/11/2017 1044   ALT 16 04/11/2017 1044   BILITOT 0.30 04/11/2017 1044       RADIOGRAPHIC STUDIES: Dg Chest 2 View  Result Date: 03/14/2017 CLINICAL DATA:  Status post lobectomy, left lobectomy 02/24/2017 EXAM: CHEST  2 VIEW COMPARISON:  02/28/2017, PET-CT 01/06/2017 FINDINGS: The right lung is clear. Resolution of previously noted small left pneumothorax. Left lung base atelectasis with slight elevation of the left diaphragm. Mild volume loss on the left consistent with history of lobectomy. Normal heart size. IMPRESSION: 1. Resolution of previously noted small left pneumothorax. 2. Postsurgical changes on the left. Mild residual atelectasis at the left lung base. Electronically Signed   By: Donavan Foil M.D.   On: 03/14/2017 14:32    ASSESSMENT: This is a very pleasant 56 years old African-American female recently diagnosed with a stage IA (T1c, N0, M0) non-small cell lung cancer, squamous cell carcinoma status post left upper lobectomy with lymph node dissection on 02/24/2017 under the care of Dr. Roxan Hockey.   PLAN: I had a lengthy discussion with the patient today about her current disease stage, prognosis and treatment options. I explained to the patient that she had curable treatment with the surgical resection. I also explained to the patient that there is no survival benefit for adjuvant systemic chemotherapy  for patient with a stage IA as the tumor size is less than 4.0 cm. I recommended for the patient to continue on observation with repeat CT scan of the chest every 6 months for the first 2 years followed by annual scan for a total of 5 years. I will see the patient back for follow-up visit in 6 months for reevaluation with repeat CT of the chest. For the gynecologic abnormalities seen on the previous PET scan, she is followed by Dr. Denman Farley For the peripheral vascular disease, she is currently under the care of Dr. Donnetta Hutching. The patient was advised to call immediately if she has any concerning symptoms in the interval. The patient voices understanding of current disease status and treatment options  and is in agreement with the current care plan.  All questions were answered. The patient knows to call the clinic with any problems, questions or concerns. We can certainly see the patient much sooner if necessary.  Thank you so much for allowing me to participate in the care of Susan Farley. I will continue to follow up the patient with you and assist in her care.  I spent 40 minutes counseling the patient face to face. The total time spent in the appointment was 60 minutes.  Disclaimer: This note was dictated with voice recognition software. Similar sounding words can inadvertently be transcribed and may not be corrected upon review.   Eilleen Kempf April 11, 2017, 11:49 AM

## 2017-04-25 DIAGNOSIS — E113393 Type 2 diabetes mellitus with moderate nonproliferative diabetic retinopathy without macular edema, bilateral: Secondary | ICD-10-CM | POA: Diagnosis not present

## 2017-04-25 DIAGNOSIS — H43813 Vitreous degeneration, bilateral: Secondary | ICD-10-CM | POA: Diagnosis not present

## 2017-04-25 DIAGNOSIS — H3552 Pigmentary retinal dystrophy: Secondary | ICD-10-CM | POA: Diagnosis not present

## 2017-04-25 DIAGNOSIS — H35372 Puckering of macula, left eye: Secondary | ICD-10-CM | POA: Diagnosis not present

## 2017-04-26 ENCOUNTER — Encounter: Payer: Self-pay | Admitting: Thoracic Surgery (Cardiothoracic Vascular Surgery)

## 2017-05-02 DIAGNOSIS — Z7984 Long term (current) use of oral hypoglycemic drugs: Secondary | ICD-10-CM | POA: Diagnosis not present

## 2017-05-02 DIAGNOSIS — D0220 Carcinoma in situ of unspecified bronchus and lung: Secondary | ICD-10-CM | POA: Diagnosis not present

## 2017-05-02 DIAGNOSIS — E119 Type 2 diabetes mellitus without complications: Secondary | ICD-10-CM | POA: Diagnosis not present

## 2017-05-02 DIAGNOSIS — E785 Hyperlipidemia, unspecified: Secondary | ICD-10-CM | POA: Diagnosis not present

## 2017-05-02 DIAGNOSIS — Z79899 Other long term (current) drug therapy: Secondary | ICD-10-CM | POA: Diagnosis not present

## 2017-05-09 DIAGNOSIS — M65331 Trigger finger, right middle finger: Secondary | ICD-10-CM | POA: Diagnosis not present

## 2017-05-09 DIAGNOSIS — M65312 Trigger thumb, left thumb: Secondary | ICD-10-CM | POA: Insufficient documentation

## 2017-05-10 ENCOUNTER — Other Ambulatory Visit: Payer: Self-pay | Admitting: Thoracic Surgery (Cardiothoracic Vascular Surgery)

## 2017-05-10 DIAGNOSIS — C3492 Malignant neoplasm of unspecified part of left bronchus or lung: Secondary | ICD-10-CM

## 2017-05-16 ENCOUNTER — Other Ambulatory Visit: Payer: Self-pay

## 2017-05-16 ENCOUNTER — Ambulatory Visit: Payer: Self-pay | Admitting: Thoracic Surgery (Cardiothoracic Vascular Surgery)

## 2017-05-16 ENCOUNTER — Encounter: Payer: Self-pay | Admitting: Thoracic Surgery (Cardiothoracic Vascular Surgery)

## 2017-05-16 ENCOUNTER — Ambulatory Visit
Admission: RE | Admit: 2017-05-16 | Discharge: 2017-05-16 | Disposition: A | Discharge status: Home / Self Care | Payer: Federal, State, Local not specified - PPO | Source: Ambulatory Visit | Attending: Thoracic Surgery (Cardiothoracic Vascular Surgery) | Admitting: Thoracic Surgery (Cardiothoracic Vascular Surgery)

## 2017-05-16 VITALS — BP 112/75 | HR 64 | Ht 64.0 in | Wt 211.0 lb

## 2017-05-16 DIAGNOSIS — C3492 Malignant neoplasm of unspecified part of left bronchus or lung: Secondary | ICD-10-CM

## 2017-05-16 DIAGNOSIS — R05 Cough: Secondary | ICD-10-CM | POA: Diagnosis not present

## 2017-05-16 NOTE — Progress Notes (Signed)
[address]       [place] [address]             [phone]    HPI: Susan Farley returns for scheduled follow-up visit  Susan Farley is a 56 year old woman with a history of tobacco abuse who had a thoracoscopic left upper lobectomy for stage Ia squamous cell carcinoma on February 24, 2017.  She went home on postoperative day #4.  I saw her back in the office on 03/14/2017.  She was sore and complained of a lack of energy.  But otherwise she was doing well.  She was not smoking.  In the interim since her last visit she saw Dr. Earlie Server.  No adjuvant therapy was indicated.  He has scheduled her for follow-up in 6 months.  She still has some incisional soreness.  She is able to wear a bra now.  It is improving, but from time to time she will have a shooting pain.  She has had a cough over the past few days. Past Medical History:  Diagnosis Date  . Asthma   . Colitis   . Depression   . Diabetes mellitus without complication ([place])   . GERD (gastroesophageal reflux disease)   . Heart murmur   . Hypercholesteremia   . Hypertension   . Retinitis pigmentosa   . Sleep apnea    doesnt wear BIPAP currently  . Stroke [facility]) Nov. 2015   no residual     Current Outpatient Medications  Medication Sig Dispense Refill  . acetaminophen (TYLENOL) 500 MG tablet Take 2 tablets (1,000 mg total) by mouth every 6 (six) hours as needed. 30 tablet 0  . amLODipine (NORVASC) 10 MG tablet Take 1 tablet (10 mg total) by mouth daily. 30 tablet 3  . aspirin EC 325 MG EC tablet Take 1 tablet (325 mg total) by mouth daily. 30 tablet 0  . [employer] FIBER GUMMIES PO Take 2 capsules by mouth daily.    Marland Kitchen dexamethasone (OZURDEX) 0.7 MG IMPL 0.7 mg by Intravitreal route every 3 (three) months.     . docusate sodium (COLACE) 100 MG capsule Take 200 mg by mouth at bedtime.     . dorzolamide (TRUSOPT) 2 % ophthalmic solution Place 1 drop into both eyes 2 (two) times daily.    . hydrochlorothiazide  (HYDRODIURIL) 25 MG tablet Take 25 mg by mouth daily.    Marland Kitchen HYDROcodone-acetaminophen (NORCO/VICODIN) 5-325 MG tablet Take 1 tablet by mouth every 6 (six) hours as needed. 30 tablet 0  . lisinopril (PRINIVIL,ZESTRIL) 5 MG tablet Take 1 tablet (5 mg total) by mouth daily. 30 tablet 3  . metFORMIN (GLUCOPHAGE) 500 MG tablet Take 500 mg by mouth 2 (two) times daily with a meal.    . nicotine (NICODERM CQ - DOSED IN MG/24 HOURS) 21 mg/24hr patch Place 1 patch (21 mg total) onto the skin daily. 28 patch 3  . pantoprazole (PROTONIX) 40 MG tablet Take 80 mg by mouth daily.     . potassium chloride SA (K-DUR,KLOR-CON) 20 MEQ tablet Take 1 tablet (20 mEq total) by mouth daily. 30 tablet 3  . QUEtiapine (SEROQUEL) 25 MG tablet Take 25 mg by mouth at bedtime.    . rosuvastatin (CRESTOR) 20 MG tablet Take 20 mg by mouth every evening.    . timolol (BETIMOL) 0.5 % ophthalmic solution Place 1 drop into both eyes 2 (two) times daily.    Marland Kitchen venlafaxine XR (EFFEXOR-XR) 37.5 MG 24  hr capsule Take 187.5 mg by mouth at bedtime.    . Vitamin D, Ergocalciferol, (DRISDOL) 50000 UNITS CAPS capsule Take 50,000 Units by mouth every 7 (seven) days.    Marland Kitchen timolol (TIMOPTIC) 0.5 % ophthalmic solution AS DIRECTED INSTILL 1 DROP IN BOTH EYES TWICE A DAY  2   No current facility-administered medications for this visit.     Physical Exam BP 112/75 (BP Location: Left Arm, Patient Position: Sitting, Cuff Size: Large)   Pulse 64   Ht 5\' 4"  (1.626 m)   Wt 211 lb (95.7 kg)   LMP 11/25/2011   SpO2 98%   BMI 36.64 kg/m  56 year old woman in no acute distress in no acute distress Alert and lungs clear with essentially equal breath sounds bilaterally Cardiac regular rate and rhythm normal S1 and S2 Incisions well-healed  Diagnostic Tests: CHEST  2 VIEW  COMPARISON:  03/14/2017  FINDINGS: Postop changes left upper lobe with ill-defined superior mediastinal structures medially. This is unchanged. Mild patchy atelectasis in the lingula. Right  lung clear. Negative for heart failure or effusion  IMPRESSION: Postop changes left upper lobe.  Mild atelectasis lingula.   Electronically Signed   By: Franchot Gallo M.D.   On: 05/16/2017 12:17 I personally reviewed the chest x-ray images.  I concur with the findings although obviously the lingula is no longer present.  Impression: Ms. Loeza is a 56 year old woman with a history of tobacco abuse who had a stage Ia non-small cell carcinoma resected with a left upper lobectomy about 3 months ago.  She is doing well at this point in time.  She does still have some occasional pains in the area which is not unusual with this type of surgery.  That should continue to improve with time.  Smoking cessation instruction/counseling given:  commended patient for quitting and reviewed strategies for preventing relapses   Plan: Follow-up as scheduled with Dr. Julien Nordmann  I will see her back in 9 months for a one-year follow-up visit  Melrose Nakayama, MD [facility] [phone]

## 2017-05-23 DIAGNOSIS — H527 Unspecified disorder of refraction: Secondary | ICD-10-CM | POA: Diagnosis not present

## 2017-06-22 DIAGNOSIS — E119 Type 2 diabetes mellitus without complications: Secondary | ICD-10-CM | POA: Diagnosis not present

## 2017-06-27 DIAGNOSIS — M79645 Pain in left finger(s): Secondary | ICD-10-CM | POA: Insufficient documentation

## 2017-06-27 DIAGNOSIS — M65312 Trigger thumb, left thumb: Secondary | ICD-10-CM | POA: Diagnosis not present

## 2017-06-27 DIAGNOSIS — M65331 Trigger finger, right middle finger: Secondary | ICD-10-CM | POA: Diagnosis not present

## 2017-06-30 DIAGNOSIS — I10 Essential (primary) hypertension: Secondary | ICD-10-CM | POA: Diagnosis not present

## 2017-06-30 DIAGNOSIS — E119 Type 2 diabetes mellitus without complications: Secondary | ICD-10-CM | POA: Diagnosis not present

## 2017-06-30 DIAGNOSIS — H401131 Primary open-angle glaucoma, bilateral, mild stage: Secondary | ICD-10-CM | POA: Diagnosis not present

## 2017-06-30 DIAGNOSIS — H18411 Arcus senilis, right eye: Secondary | ICD-10-CM | POA: Diagnosis not present

## 2017-06-30 DIAGNOSIS — H3552 Pigmentary retinal dystrophy: Secondary | ICD-10-CM | POA: Diagnosis not present

## 2017-07-04 DIAGNOSIS — I10 Essential (primary) hypertension: Secondary | ICD-10-CM | POA: Diagnosis not present

## 2017-07-04 DIAGNOSIS — Z5181 Encounter for therapeutic drug level monitoring: Secondary | ICD-10-CM | POA: Diagnosis not present

## 2017-07-04 DIAGNOSIS — E119 Type 2 diabetes mellitus without complications: Secondary | ICD-10-CM | POA: Diagnosis not present

## 2017-07-04 DIAGNOSIS — Z7984 Long term (current) use of oral hypoglycemic drugs: Secondary | ICD-10-CM | POA: Diagnosis not present

## 2017-07-19 DIAGNOSIS — M65312 Trigger thumb, left thumb: Secondary | ICD-10-CM | POA: Diagnosis not present

## 2017-07-25 DIAGNOSIS — H3552 Pigmentary retinal dystrophy: Secondary | ICD-10-CM | POA: Diagnosis not present

## 2017-07-25 DIAGNOSIS — E113312 Type 2 diabetes mellitus with moderate nonproliferative diabetic retinopathy with macular edema, left eye: Secondary | ICD-10-CM | POA: Diagnosis not present

## 2017-07-25 DIAGNOSIS — H35373 Puckering of macula, bilateral: Secondary | ICD-10-CM | POA: Diagnosis not present

## 2017-07-25 DIAGNOSIS — E113391 Type 2 diabetes mellitus with moderate nonproliferative diabetic retinopathy without macular edema, right eye: Secondary | ICD-10-CM | POA: Diagnosis not present

## 2017-08-16 DIAGNOSIS — E113312 Type 2 diabetes mellitus with moderate nonproliferative diabetic retinopathy with macular edema, left eye: Secondary | ICD-10-CM | POA: Diagnosis not present

## 2017-08-22 DIAGNOSIS — Z7984 Long term (current) use of oral hypoglycemic drugs: Secondary | ICD-10-CM | POA: Diagnosis not present

## 2017-08-22 DIAGNOSIS — I10 Essential (primary) hypertension: Secondary | ICD-10-CM | POA: Diagnosis not present

## 2017-08-22 DIAGNOSIS — E785 Hyperlipidemia, unspecified: Secondary | ICD-10-CM | POA: Diagnosis not present

## 2017-08-22 DIAGNOSIS — E119 Type 2 diabetes mellitus without complications: Secondary | ICD-10-CM | POA: Diagnosis not present

## 2017-08-22 DIAGNOSIS — Z5181 Encounter for therapeutic drug level monitoring: Secondary | ICD-10-CM | POA: Diagnosis not present

## 2017-09-06 DIAGNOSIS — H18411 Arcus senilis, right eye: Secondary | ICD-10-CM | POA: Diagnosis not present

## 2017-09-06 DIAGNOSIS — I10 Essential (primary) hypertension: Secondary | ICD-10-CM | POA: Diagnosis not present

## 2017-09-06 DIAGNOSIS — E119 Type 2 diabetes mellitus without complications: Secondary | ICD-10-CM | POA: Diagnosis not present

## 2017-09-06 DIAGNOSIS — H3552 Pigmentary retinal dystrophy: Secondary | ICD-10-CM | POA: Diagnosis not present

## 2017-09-08 DIAGNOSIS — H20019 Primary iridocyclitis, unspecified eye: Secondary | ICD-10-CM | POA: Diagnosis not present

## 2017-09-15 DIAGNOSIS — E113311 Type 2 diabetes mellitus with moderate nonproliferative diabetic retinopathy with macular edema, right eye: Secondary | ICD-10-CM | POA: Diagnosis not present

## 2017-09-15 DIAGNOSIS — H35373 Puckering of macula, bilateral: Secondary | ICD-10-CM | POA: Diagnosis not present

## 2017-09-15 DIAGNOSIS — H3552 Pigmentary retinal dystrophy: Secondary | ICD-10-CM | POA: Diagnosis not present

## 2017-09-15 DIAGNOSIS — E113392 Type 2 diabetes mellitus with moderate nonproliferative diabetic retinopathy without macular edema, left eye: Secondary | ICD-10-CM | POA: Diagnosis not present

## 2017-09-18 DIAGNOSIS — Z1231 Encounter for screening mammogram for malignant neoplasm of breast: Secondary | ICD-10-CM | POA: Diagnosis not present

## 2017-10-03 DIAGNOSIS — E113311 Type 2 diabetes mellitus with moderate nonproliferative diabetic retinopathy with macular edema, right eye: Secondary | ICD-10-CM | POA: Diagnosis not present

## 2017-10-04 ENCOUNTER — Encounter (HOSPITAL_COMMUNITY): Payer: Self-pay

## 2017-10-04 ENCOUNTER — Inpatient Hospital Stay: Payer: Federal, State, Local not specified - PPO | Attending: Internal Medicine

## 2017-10-04 ENCOUNTER — Ambulatory Visit (HOSPITAL_COMMUNITY)
Admission: RE | Admit: 2017-10-04 | Discharge: 2017-10-04 | Disposition: A | Discharge status: Home / Self Care | Payer: Federal, State, Local not specified - PPO | Source: Ambulatory Visit | Attending: Internal Medicine | Admitting: Internal Medicine

## 2017-10-04 DIAGNOSIS — C3490 Malignant neoplasm of unspecified part of unspecified bronchus or lung: Secondary | ICD-10-CM | POA: Diagnosis not present

## 2017-10-04 DIAGNOSIS — C3492 Malignant neoplasm of unspecified part of left bronchus or lung: Secondary | ICD-10-CM

## 2017-10-04 DIAGNOSIS — I2510 Atherosclerotic heart disease of native coronary artery without angina pectoris: Secondary | ICD-10-CM | POA: Diagnosis not present

## 2017-10-04 DIAGNOSIS — C3412 Malignant neoplasm of upper lobe, left bronchus or lung: Secondary | ICD-10-CM | POA: Diagnosis not present

## 2017-10-04 DIAGNOSIS — Z902 Acquired absence of lung [part of]: Secondary | ICD-10-CM | POA: Diagnosis not present

## 2017-10-04 DIAGNOSIS — Z85118 Personal history of other malignant neoplasm of bronchus and lung: Secondary | ICD-10-CM | POA: Insufficient documentation

## 2017-10-04 DIAGNOSIS — I700 Atherosclerosis of aorta: Secondary | ICD-10-CM | POA: Diagnosis not present

## 2017-10-04 LAB — COMPREHENSIVE METABOLIC PANEL
ALBUMIN: 3.7 g/dL (ref 3.5–5.0)
ALT: 17 U/L (ref 0–55)
AST: 24 U/L (ref 5–34)
Alkaline Phosphatase: 82 U/L (ref 40–150)
Anion gap: 8 (ref 3–11)
BUN: 9 mg/dL (ref 7–26)
CALCIUM: 10 mg/dL (ref 8.4–10.4)
CO2: 25 mmol/L (ref 22–29)
Chloride: 108 mmol/L (ref 98–109)
Creatinine, Ser: 0.84 mg/dL (ref 0.60–1.10)
GFR calc non Af Amer: >60 mL/min (ref >60)
GLUCOSE: 120 mg/dL (ref 70–140)
POTASSIUM: 3.7 mmol/L (ref 3.5–5.1)
SODIUM: 141 mmol/L (ref 136–145)
Total Bilirubin: 0.2 mg/dL (ref 0.2–1.2)
Total Protein: 7.7 g/dL (ref 6.4–8.3)

## 2017-10-04 LAB — CBC WITH DIFFERENTIAL/PLATELET
BASOS ABS: 0 10*3/uL (ref 0.0–0.1)
BASOS PCT: 1 %
Eosinophils Absolute: 0.2 10*3/uL (ref 0.0–0.5)
Eosinophils Relative: 3 %
HCT: 35.6 % (ref 34.8–46.6)
HEMOGLOBIN: 11.4 g/dL — AB (ref 11.6–15.9)
LYMPHS PCT: 27 %
Lymphs Abs: 2.1 10*3/uL (ref 0.9–3.3)
MCH: 27.1 pg (ref 25.1–34.0)
MCHC: 32.1 g/dL (ref 31.5–36.0)
MCV: 84.3 fL (ref 79.5–101.0)
MONO ABS: 0.4 10*3/uL (ref 0.1–0.9)
MONOS PCT: 5 %
NEUTROS PCT: 64 %
Neutro Abs: 5.2 10*3/uL (ref 1.5–6.5)
Platelets: 294 10*3/uL (ref 145–400)
RBC: 4.22 MIL/uL (ref 3.70–5.45)
RDW: 17.2 % — AB (ref 11.2–14.5)
WBC: 8 10*3/uL (ref 3.9–10.3)

## 2017-10-04 MED ORDER — IOHEXOL 300 MG/ML  SOLN
75.0000 mL | Freq: Once | INTRAMUSCULAR | Status: AC | PRN
  Administered 2017-10-04: 75 mL via INTRAVENOUS

## 2017-10-06 ENCOUNTER — Encounter: Payer: Self-pay | Admitting: Internal Medicine

## 2017-10-06 ENCOUNTER — Inpatient Hospital Stay (HOSPITAL_BASED_OUTPATIENT_CLINIC_OR_DEPARTMENT_OTHER): Payer: Federal, State, Local not specified - PPO | Admitting: Internal Medicine

## 2017-10-06 ENCOUNTER — Telehealth: Payer: Self-pay | Admitting: Internal Medicine

## 2017-10-06 VITALS — BP 125/77 | HR 82 | Temp 97.9°F | Resp 18 | Ht 64.0 in | Wt 213.4 lb

## 2017-10-06 DIAGNOSIS — C3490 Malignant neoplasm of unspecified part of unspecified bronchus or lung: Secondary | ICD-10-CM

## 2017-10-06 DIAGNOSIS — Z85118 Personal history of other malignant neoplasm of bronchus and lung: Secondary | ICD-10-CM | POA: Diagnosis not present

## 2017-10-06 DIAGNOSIS — C3492 Malignant neoplasm of unspecified part of left bronchus or lung: Secondary | ICD-10-CM

## 2017-10-06 NOTE — Telephone Encounter (Signed)
Scheduled appt per 4/19 los - sent reminder letter in the mail with appt date and time.

## 2017-10-06 NOTE — Progress Notes (Signed)
[place] [phone]   [phone]  OFFICE PROGRESS NOTE  Patient, No Pcp Per No address on file  DIAGNOSIS: Stage IA (T1c, N0, M0) non-small cell lung cancer, squamous cell carcinoma   PRIOR THERAPY: Status post left upper lobectomy with lymph node dissection on 02/24/2017 under the care of Dr. Roxan Hockey.  CURRENT THERAPY: Observation.  INTERVAL HISTORY: Susan Farley 57 y.o. female returns to the clinic today for follow-up visit.  The patient is feeling fine today with no specific complaints.  She denied having any chest pain, shortness breath, cough or hemoptysis.  She denied having any fever or chills.  She has no nausea, vomiting, diarrhea or constipation.  She is here today for evaluation with repeat CT scan of the chest.  MEDICAL HISTORY: Past Medical History:  Diagnosis Date  . Asthma   . Colitis   . Depression   . Diabetes mellitus without complication ([place])   . GERD (gastroesophageal reflux disease)   . Heart murmur   . Hypercholesteremia   . Hypertension   . Retinitis pigmentosa   . Sleep apnea    doesnt wear BIPAP currently  . Stroke [facility]) Nov. 2015   no residual    ALLERGIES:  is allergic to shellfish allergy.  MEDICATIONS:  Current Outpatient Medications  Medication Sig Dispense Refill  . acetaminophen (TYLENOL) 500 MG tablet Take 2 tablets (1,000 mg total) by mouth every 6 (six) hours as needed. 30 tablet 0  . amLODipine (NORVASC) 10 MG tablet Take 1 tablet (10 mg total) by mouth daily. 30 tablet 3  . aspirin EC 325 MG EC tablet Take 1 tablet (325 mg total) by mouth daily. 30 tablet 0  . CVS FIBER GUMMIES PO Take 2 capsules by mouth daily.    Marland Kitchen dexamethasone (OZURDEX) 0.7 MG IMPL 0.7 mg by Intravitreal route every 3 (three) months.     . docusate sodium (COLACE) 100 MG capsule Take 200 mg by mouth at bedtime.     . dorzolamide (TRUSOPT) 2 % ophthalmic solution Place 1 drop into both eyes 2 (two) times daily.     . hydrochlorothiazide (HYDRODIURIL) 25 MG tablet Take 25 mg by mouth daily.    Marland Kitchen HYDROcodone-acetaminophen (NORCO/VICODIN) 5-325 MG tablet Take 1 tablet by mouth every 6 (six) hours as needed. 30 tablet 0  . lisinopril (PRINIVIL,ZESTRIL) 5 MG tablet Take 1 tablet (5 mg total) by mouth daily. 30 tablet 3  . metFORMIN (GLUCOPHAGE) 500 MG tablet Take 500 mg by mouth 2 (two) times daily with a meal.    . nicotine (NICODERM CQ - DOSED IN MG/24 HOURS) 21 mg/24hr patch Place 1 patch (21 mg total) onto the skin daily. 28 patch 3  . pantoprazole (PROTONIX) 40 MG tablet Take 80 mg by mouth daily.     . potassium chloride SA (K-DUR,KLOR-CON) 20 MEQ tablet Take 1 tablet (20 mEq total) by mouth daily. 30 tablet 3  . QUEtiapine (SEROQUEL) 25 MG tablet Take 25 mg by mouth at bedtime.    . rosuvastatin (CRESTOR) 20 MG tablet Take 20 mg by mouth every evening.    . timolol (BETIMOL) 0.5 % ophthalmic solution Place 1 drop into both eyes 2 (two) times daily.    . timolol (TIMOPTIC) 0.5 % ophthalmic solution AS DIRECTED INSTILL 1 DROP IN BOTH EYES TWICE A DAY  2  . venlafaxine XR (EFFEXOR-XR) 37.5 MG 24 hr capsule Take 187.5 mg by mouth at bedtime.    Marland Kitchen  Vitamin D, Ergocalciferol, (DRISDOL) 50000 UNITS CAPS capsule Take 50,000 Units by mouth every 7 (seven) days.     No current facility-administered medications for this visit.     SURGICAL HISTORY:  Past Surgical History:  Procedure Laterality Date  . BONE GRAFT HIP ILIAC CREST    . CATARACT EXTRACTION    . IUD REMOVAL    . LOBECTOMY Left 02/24/2017   Procedure: Left upper lobe LOBECTOMY;  Surgeon: Melrose Nakayama, MD;  Location: [place];  Service: Thoracic;  Laterality: Left;  . NODE DISSECTION Left 02/24/2017   Procedure: NODE DISSECTION;  Surgeon: Melrose Nakayama, MD;  Location: [place];  Service: Thoracic;  Laterality: Left;  . OOPHORECTOMY Right 2004   removal of cyst and ovary  . RETINAL CRYOABLATION Bilateral    patient unsure of whether she  had this  . VIDEO ASSISTED THORACOSCOPY (VATS)/WEDGE RESECTION Left 02/24/2017   Procedure: Left VIDEO ASSISTED THORACOSCOPY (VATS)/WEDGE RESECTION;  Surgeon: Melrose Nakayama, MD;  Location: [facility] OR;  Service: Thoracic;  Laterality: Left;    REVIEW OF SYSTEMS:  A comprehensive review of systems was negative.   PHYSICAL EXAMINATION: General appearance: alert, cooperative and no distress Head: Normocephalic, without obvious abnormality, atraumatic Neck: no adenopathy, no JVD, supple, symmetrical, trachea midline and thyroid not enlarged, symmetric, no tenderness/mass/nodules Lymph nodes: Cervical, supraclavicular, and axillary nodes normal. Resp: clear to auscultation bilaterally Back: symmetric, no curvature. ROM normal. No CVA tenderness. Cardio: regular rate and rhythm, S1, S2 normal, no murmur, click, rub or gallop GI: soft, non-tender; bowel sounds normal; no masses,  no organomegaly Extremities: extremities normal, atraumatic, no cyanosis or edema  ECOG PERFORMANCE STATUS: 0 - Asymptomatic  Blood pressure 125/77, pulse 82, temperature 97.9 F (36.6 C), temperature source Oral, resp. rate 18, height 5\' 4"  (1.626 m), weight 213 lb 6.4 oz (96.8 kg), last menstrual period 11/25/2011, SpO2 97 %.  LABORATORY DATA: Lab Results  Component Value Date   WBC 8.0 10/04/2017   HGB 11.4 (L) 10/04/2017   HCT 35.6 10/04/2017   MCV 84.3 10/04/2017   PLT 294 10/04/2017      Chemistry      Component Value Date/Time   NA 141 10/04/2017 1050   NA 141 04/11/2017 1044   K 3.7 10/04/2017 1050   K 3.7 04/11/2017 1044   CL 108 10/04/2017 1050   CO2 25 10/04/2017 1050   CO2 29 04/11/2017 1044   BUN 9 10/04/2017 1050   BUN 11.4 04/11/2017 1044   CREATININE 0.84 10/04/2017 1050   CREATININE 0.9 04/11/2017 1044      Component Value Date/Time   CALCIUM 10.0 10/04/2017 1050   CALCIUM 10.3 04/11/2017 1044   ALKPHOS 82 10/04/2017 1050   ALKPHOS 87 04/11/2017 1044   AST 24 10/04/2017 1050    AST 21 04/11/2017 1044   ALT 17 10/04/2017 1050   ALT 16 04/11/2017 1044   BILITOT 0.2 10/04/2017 1050   BILITOT 0.30 04/11/2017 1044       RADIOGRAPHIC STUDIES: Ct Chest W Contrast  Result Date: 10/04/2017 CLINICAL DATA:  Followup left lung squamous cell carcinoma. Status post left upper lobectomy. EXAM: CT CHEST WITH CONTRAST TECHNIQUE: Multidetector CT imaging of the chest was performed during intravenous contrast administration. CONTRAST:  37mL OMNIPAQUE IOHEXOL 300 MG/ML  SOLN COMPARISON:  PET-CT on 01/06/2017 FINDINGS: Cardiovascular: No acute findings. Aortic and coronary artery atherosclerosis. Mediastinum/Nodes: No evidence of pathologically enlarged lymph nodes or other soft tissue masses. Lungs/Pleura: Expected postop changes from left  upper lobectomy since previous study. No evidence of suspicious pulmonary nodules or masses. No evidence of pulmonary infiltrate or pleural effusion. Upper Abdomen:  Unremarkable.  Normal appearance of adrenal glands. Musculoskeletal:  No suspicious bone lesions. IMPRESSION: Postop changes from previous left upper lobectomy. No evidence of recurrent or metastatic carcinoma. Aortic Atherosclerosis (ICD10-I70.0). Coronary artery calcification. Electronically Signed   By: Earle Gell M.D.   On: 10/04/2017 17:12    ASSESSMENT AND PLAN: This is a very pleasant 57 years old African-American female with a stage Ia non-small cell lung cancer, squamous cell carcinoma status post left upper lobectomy with lymph node dissection in September 2018.  The patient is feeling fine today with no concerning complaints.  Her recent CT scan of the chest showed no evidence for disease recurrence.  I discussed the scan results with the patient and recommended for her to continue on observation with repeat CT scan of the chest in 6 months.  She was advised to call immediately if she has any concerning symptoms in the interval. The patient voices understanding of current disease  status and treatment options and is in agreement with the current care plan.  All questions were answered. The patient knows to call the clinic with any problems, questions or concerns. We can certainly see the patient much sooner if necessary.  I spent 10 minutes counseling the patient face to face. The total time spent in the appointment was 15 minutes.  Disclaimer: This note was dictated with voice recognition software. Similar sounding words can inadvertently be transcribed and may not be corrected upon review.

## 2017-10-10 ENCOUNTER — Ambulatory Visit: Payer: Federal, State, Local not specified - PPO | Admitting: Internal Medicine

## 2017-10-11 DIAGNOSIS — Z1231 Encounter for screening mammogram for malignant neoplasm of breast: Secondary | ICD-10-CM | POA: Diagnosis not present

## 2017-10-25 DIAGNOSIS — C50911 Malignant neoplasm of unspecified site of right female breast: Secondary | ICD-10-CM | POA: Diagnosis not present

## 2017-10-26 DIAGNOSIS — Z131 Encounter for screening for diabetes mellitus: Secondary | ICD-10-CM | POA: Diagnosis not present

## 2017-11-03 DIAGNOSIS — D0511 Intraductal carcinoma in situ of right breast: Secondary | ICD-10-CM | POA: Diagnosis not present

## 2017-11-03 DIAGNOSIS — Z85118 Personal history of other malignant neoplasm of bronchus and lung: Secondary | ICD-10-CM | POA: Diagnosis not present

## 2017-11-03 DIAGNOSIS — E119 Type 2 diabetes mellitus without complications: Secondary | ICD-10-CM | POA: Diagnosis not present

## 2017-11-03 DIAGNOSIS — F17211 Nicotine dependence, cigarettes, in remission: Secondary | ICD-10-CM | POA: Diagnosis not present

## 2017-11-03 DIAGNOSIS — Z170 Estrogen receptor positive status [ER+]: Secondary | ICD-10-CM | POA: Diagnosis not present

## 2017-11-07 DIAGNOSIS — C3432 Malignant neoplasm of lower lobe, left bronchus or lung: Secondary | ICD-10-CM | POA: Diagnosis not present

## 2017-11-07 DIAGNOSIS — Z902 Acquired absence of lung [part of]: Secondary | ICD-10-CM | POA: Diagnosis not present

## 2017-11-07 DIAGNOSIS — C50911 Malignant neoplasm of unspecified site of right female breast: Secondary | ICD-10-CM | POA: Diagnosis not present

## 2017-11-07 DIAGNOSIS — Z803 Family history of malignant neoplasm of breast: Secondary | ICD-10-CM | POA: Diagnosis not present

## 2017-11-08 DIAGNOSIS — H35373 Puckering of macula, bilateral: Secondary | ICD-10-CM | POA: Diagnosis not present

## 2017-11-08 DIAGNOSIS — H43813 Vitreous degeneration, bilateral: Secondary | ICD-10-CM | POA: Diagnosis not present

## 2017-11-08 DIAGNOSIS — H3552 Pigmentary retinal dystrophy: Secondary | ICD-10-CM | POA: Diagnosis not present

## 2017-11-08 DIAGNOSIS — E113393 Type 2 diabetes mellitus with moderate nonproliferative diabetic retinopathy without macular edema, bilateral: Secondary | ICD-10-CM | POA: Diagnosis not present

## 2017-11-15 DIAGNOSIS — D0511 Intraductal carcinoma in situ of right breast: Secondary | ICD-10-CM | POA: Diagnosis not present

## 2017-11-15 DIAGNOSIS — C50911 Malignant neoplasm of unspecified site of right female breast: Secondary | ICD-10-CM | POA: Diagnosis not present

## 2017-11-17 DIAGNOSIS — Z170 Estrogen receptor positive status [ER+]: Secondary | ICD-10-CM | POA: Diagnosis not present

## 2017-11-17 DIAGNOSIS — C50911 Malignant neoplasm of unspecified site of right female breast: Secondary | ICD-10-CM | POA: Diagnosis not present

## 2017-11-17 DIAGNOSIS — Z803 Family history of malignant neoplasm of breast: Secondary | ICD-10-CM | POA: Diagnosis not present

## 2017-11-17 DIAGNOSIS — E119 Type 2 diabetes mellitus without complications: Secondary | ICD-10-CM | POA: Diagnosis not present

## 2017-11-17 DIAGNOSIS — F17211 Nicotine dependence, cigarettes, in remission: Secondary | ICD-10-CM | POA: Diagnosis not present

## 2017-11-22 DIAGNOSIS — N6311 Unspecified lump in the right breast, upper outer quadrant: Secondary | ICD-10-CM | POA: Diagnosis not present

## 2017-11-22 DIAGNOSIS — Z853 Personal history of malignant neoplasm of breast: Secondary | ICD-10-CM | POA: Diagnosis not present

## 2017-11-22 DIAGNOSIS — N6022 Fibroadenosis of left breast: Secondary | ICD-10-CM | POA: Diagnosis not present

## 2017-11-22 DIAGNOSIS — R928 Other abnormal and inconclusive findings on diagnostic imaging of breast: Secondary | ICD-10-CM | POA: Diagnosis not present

## 2017-11-22 DIAGNOSIS — Z131 Encounter for screening for diabetes mellitus: Secondary | ICD-10-CM | POA: Diagnosis not present

## 2017-11-22 DIAGNOSIS — N6021 Fibroadenosis of right breast: Secondary | ICD-10-CM | POA: Diagnosis not present

## 2017-11-27 DIAGNOSIS — C50911 Malignant neoplasm of unspecified site of right female breast: Secondary | ICD-10-CM | POA: Diagnosis not present

## 2017-11-30 DIAGNOSIS — E119 Type 2 diabetes mellitus without complications: Secondary | ICD-10-CM | POA: Diagnosis not present

## 2017-11-30 DIAGNOSIS — H3552 Pigmentary retinal dystrophy: Secondary | ICD-10-CM | POA: Diagnosis not present

## 2017-11-30 DIAGNOSIS — H209 Unspecified iridocyclitis: Secondary | ICD-10-CM | POA: Diagnosis not present

## 2017-11-30 DIAGNOSIS — C50911 Malignant neoplasm of unspecified site of right female breast: Secondary | ICD-10-CM | POA: Diagnosis not present

## 2017-11-30 DIAGNOSIS — Z170 Estrogen receptor positive status [ER+]: Secondary | ICD-10-CM | POA: Diagnosis not present

## 2017-11-30 DIAGNOSIS — Z961 Presence of intraocular lens: Secondary | ICD-10-CM | POA: Diagnosis not present

## 2017-12-06 DIAGNOSIS — Z1389 Encounter for screening for other disorder: Secondary | ICD-10-CM | POA: Diagnosis not present

## 2017-12-06 DIAGNOSIS — C50919 Malignant neoplasm of unspecified site of unspecified female breast: Secondary | ICD-10-CM | POA: Diagnosis not present

## 2017-12-07 DIAGNOSIS — C50911 Malignant neoplasm of unspecified site of right female breast: Secondary | ICD-10-CM | POA: Diagnosis not present

## 2017-12-07 DIAGNOSIS — C3432 Malignant neoplasm of lower lobe, left bronchus or lung: Secondary | ICD-10-CM | POA: Diagnosis not present

## 2017-12-07 DIAGNOSIS — E119 Type 2 diabetes mellitus without complications: Secondary | ICD-10-CM | POA: Diagnosis not present

## 2017-12-07 DIAGNOSIS — Z902 Acquired absence of lung [part of]: Secondary | ICD-10-CM | POA: Diagnosis not present

## 2017-12-07 DIAGNOSIS — Z5111 Encounter for antineoplastic chemotherapy: Secondary | ICD-10-CM | POA: Diagnosis not present

## 2017-12-07 DIAGNOSIS — K5900 Constipation, unspecified: Secondary | ICD-10-CM | POA: Diagnosis not present

## 2017-12-19 ENCOUNTER — Encounter (HOSPITAL_COMMUNITY): Payer: Federal, State, Local not specified - PPO

## 2017-12-19 ENCOUNTER — Ambulatory Visit: Payer: Federal, State, Local not specified - PPO | Admitting: Family

## 2017-12-22 DIAGNOSIS — C50911 Malignant neoplasm of unspecified site of right female breast: Secondary | ICD-10-CM | POA: Diagnosis not present

## 2017-12-22 DIAGNOSIS — E119 Type 2 diabetes mellitus without complications: Secondary | ICD-10-CM | POA: Diagnosis not present

## 2017-12-22 DIAGNOSIS — C3412 Malignant neoplasm of upper lobe, left bronchus or lung: Secondary | ICD-10-CM | POA: Diagnosis not present

## 2017-12-22 DIAGNOSIS — Z5111 Encounter for antineoplastic chemotherapy: Secondary | ICD-10-CM | POA: Diagnosis not present

## 2017-12-22 DIAGNOSIS — Z902 Acquired absence of lung [part of]: Secondary | ICD-10-CM | POA: Diagnosis not present

## 2017-12-29 DIAGNOSIS — H35373 Puckering of macula, bilateral: Secondary | ICD-10-CM | POA: Diagnosis not present

## 2017-12-29 DIAGNOSIS — H3552 Pigmentary retinal dystrophy: Secondary | ICD-10-CM | POA: Diagnosis not present

## 2017-12-29 DIAGNOSIS — E113212 Type 2 diabetes mellitus with mild nonproliferative diabetic retinopathy with macular edema, left eye: Secondary | ICD-10-CM | POA: Diagnosis not present

## 2017-12-29 DIAGNOSIS — E113391 Type 2 diabetes mellitus with moderate nonproliferative diabetic retinopathy without macular edema, right eye: Secondary | ICD-10-CM | POA: Diagnosis not present

## 2018-01-01 DIAGNOSIS — E113212 Type 2 diabetes mellitus with mild nonproliferative diabetic retinopathy with macular edema, left eye: Secondary | ICD-10-CM | POA: Diagnosis not present

## 2018-01-04 DIAGNOSIS — Z5111 Encounter for antineoplastic chemotherapy: Secondary | ICD-10-CM | POA: Diagnosis not present

## 2018-01-04 DIAGNOSIS — C3412 Malignant neoplasm of upper lobe, left bronchus or lung: Secondary | ICD-10-CM | POA: Diagnosis not present

## 2018-01-04 DIAGNOSIS — Z902 Acquired absence of lung [part of]: Secondary | ICD-10-CM | POA: Diagnosis not present

## 2018-01-04 DIAGNOSIS — C50911 Malignant neoplasm of unspecified site of right female breast: Secondary | ICD-10-CM | POA: Diagnosis not present

## 2018-01-17 DIAGNOSIS — H20023 Recurrent acute iridocyclitis, bilateral: Secondary | ICD-10-CM | POA: Diagnosis not present

## 2018-01-18 DIAGNOSIS — C3412 Malignant neoplasm of upper lobe, left bronchus or lung: Secondary | ICD-10-CM | POA: Diagnosis not present

## 2018-01-18 DIAGNOSIS — E119 Type 2 diabetes mellitus without complications: Secondary | ICD-10-CM | POA: Diagnosis not present

## 2018-01-18 DIAGNOSIS — K5900 Constipation, unspecified: Secondary | ICD-10-CM | POA: Diagnosis not present

## 2018-01-18 DIAGNOSIS — Z5111 Encounter for antineoplastic chemotherapy: Secondary | ICD-10-CM | POA: Diagnosis not present

## 2018-01-18 DIAGNOSIS — Z7984 Long term (current) use of oral hypoglycemic drugs: Secondary | ICD-10-CM | POA: Diagnosis not present

## 2018-01-18 DIAGNOSIS — Z733 Stress, not elsewhere classified: Secondary | ICD-10-CM | POA: Diagnosis not present

## 2018-01-18 DIAGNOSIS — C50911 Malignant neoplasm of unspecified site of right female breast: Secondary | ICD-10-CM | POA: Diagnosis not present

## 2018-01-30 DIAGNOSIS — H35373 Puckering of macula, bilateral: Secondary | ICD-10-CM | POA: Diagnosis not present

## 2018-01-30 DIAGNOSIS — E113292 Type 2 diabetes mellitus with mild nonproliferative diabetic retinopathy without macular edema, left eye: Secondary | ICD-10-CM | POA: Diagnosis not present

## 2018-01-30 DIAGNOSIS — E113391 Type 2 diabetes mellitus with moderate nonproliferative diabetic retinopathy without macular edema, right eye: Secondary | ICD-10-CM | POA: Diagnosis not present

## 2018-01-30 DIAGNOSIS — H3552 Pigmentary retinal dystrophy: Secondary | ICD-10-CM | POA: Diagnosis not present

## 2018-02-01 DIAGNOSIS — K5900 Constipation, unspecified: Secondary | ICD-10-CM | POA: Diagnosis not present

## 2018-02-01 DIAGNOSIS — Z733 Stress, not elsewhere classified: Secondary | ICD-10-CM | POA: Diagnosis not present

## 2018-02-01 DIAGNOSIS — C50911 Malignant neoplasm of unspecified site of right female breast: Secondary | ICD-10-CM | POA: Diagnosis not present

## 2018-02-01 DIAGNOSIS — Z5111 Encounter for antineoplastic chemotherapy: Secondary | ICD-10-CM | POA: Diagnosis not present

## 2018-02-01 DIAGNOSIS — Z131 Encounter for screening for diabetes mellitus: Secondary | ICD-10-CM | POA: Diagnosis not present

## 2018-02-15 DIAGNOSIS — C50911 Malignant neoplasm of unspecified site of right female breast: Secondary | ICD-10-CM | POA: Diagnosis not present

## 2018-02-15 DIAGNOSIS — B379 Candidiasis, unspecified: Secondary | ICD-10-CM | POA: Diagnosis not present

## 2018-02-15 DIAGNOSIS — Z5111 Encounter for antineoplastic chemotherapy: Secondary | ICD-10-CM | POA: Diagnosis not present

## 2018-02-20 ENCOUNTER — Ambulatory Visit: Payer: Federal, State, Local not specified - PPO | Admitting: Thoracic Surgery (Cardiothoracic Vascular Surgery)

## 2018-02-28 DIAGNOSIS — H26492 Other secondary cataract, left eye: Secondary | ICD-10-CM | POA: Diagnosis not present

## 2018-03-01 DIAGNOSIS — C50911 Malignant neoplasm of unspecified site of right female breast: Secondary | ICD-10-CM | POA: Diagnosis not present

## 2018-03-01 DIAGNOSIS — N959 Unspecified menopausal and perimenopausal disorder: Secondary | ICD-10-CM | POA: Diagnosis not present

## 2018-03-01 DIAGNOSIS — C50919 Malignant neoplasm of unspecified site of unspecified female breast: Secondary | ICD-10-CM | POA: Diagnosis not present

## 2018-03-01 DIAGNOSIS — Z5111 Encounter for antineoplastic chemotherapy: Secondary | ICD-10-CM | POA: Diagnosis not present

## 2018-03-15 DIAGNOSIS — C50911 Malignant neoplasm of unspecified site of right female breast: Secondary | ICD-10-CM | POA: Diagnosis not present

## 2018-03-15 DIAGNOSIS — C50919 Malignant neoplasm of unspecified site of unspecified female breast: Secondary | ICD-10-CM | POA: Diagnosis not present

## 2018-03-15 DIAGNOSIS — Z5111 Encounter for antineoplastic chemotherapy: Secondary | ICD-10-CM | POA: Diagnosis not present

## 2018-03-20 ENCOUNTER — Ambulatory Visit: Payer: Federal, State, Local not specified - PPO | Admitting: Thoracic Surgery (Cardiothoracic Vascular Surgery)

## 2018-03-22 ENCOUNTER — Telehealth: Payer: Self-pay | Admitting: *Deleted

## 2018-03-22 NOTE — Telephone Encounter (Signed)
Pt called states " I need to cancel for CT, Im going to the [place] on 10/14 and getting MRI and them mastectomy. I just need a break. I cant go tot he CT, I need to get everything with the [place] taken care of first." Gave pt central scheduling phone #, discussed with her to r/s her ct for later date. MD and lab appt will be cancelled until she has r/s the CT scan. Pt advised she will call back when she has r/s scan. Message to scheduling.

## 2018-03-26 ENCOUNTER — Telehealth: Payer: Self-pay

## 2018-03-26 NOTE — Telephone Encounter (Signed)
-----   Message from Melrose Nakayama, MD sent at 03/26/2018  8:30 AM EDT ----- [employer] ----- Message ----- From: Donnella Sham, RN Sent: 03/22/2018   9:23 AM EDT To: Melrose Nakayama, MD  Patient stated she wanted to cancel her appointment with you on 10/8 for year f/u due to breast CA.  She stated she just finished Chemo and has a lot of upcoming appointments with the [place] and has been going back and forth.  She stated she just wanted to keep you in the loop.  She also said she would call us back to reschedule.  Just FYI.  Thanks,  Caryl Pina

## 2018-03-27 ENCOUNTER — Ambulatory Visit: Payer: Federal, State, Local not specified - PPO | Admitting: Thoracic Surgery (Cardiothoracic Vascular Surgery)

## 2018-03-29 DIAGNOSIS — C50911 Malignant neoplasm of unspecified site of right female breast: Secondary | ICD-10-CM | POA: Diagnosis not present

## 2018-04-02 ENCOUNTER — Other Ambulatory Visit: Payer: Federal, State, Local not specified - PPO

## 2018-04-02 ENCOUNTER — Ambulatory Visit (HOSPITAL_COMMUNITY): Payer: Federal, State, Local not specified - PPO

## 2018-04-03 DIAGNOSIS — H35373 Puckering of macula, bilateral: Secondary | ICD-10-CM | POA: Diagnosis not present

## 2018-04-03 DIAGNOSIS — E113292 Type 2 diabetes mellitus with mild nonproliferative diabetic retinopathy without macular edema, left eye: Secondary | ICD-10-CM | POA: Diagnosis not present

## 2018-04-03 DIAGNOSIS — E113391 Type 2 diabetes mellitus with moderate nonproliferative diabetic retinopathy without macular edema, right eye: Secondary | ICD-10-CM | POA: Diagnosis not present

## 2018-04-03 DIAGNOSIS — H3552 Pigmentary retinal dystrophy: Secondary | ICD-10-CM | POA: Diagnosis not present

## 2018-04-04 ENCOUNTER — Ambulatory Visit: Payer: Federal, State, Local not specified - PPO | Admitting: Internal Medicine

## 2018-04-06 DIAGNOSIS — Z01818 Encounter for other preprocedural examination: Secondary | ICD-10-CM | POA: Diagnosis not present

## 2018-04-06 DIAGNOSIS — D0511 Intraductal carcinoma in situ of right breast: Secondary | ICD-10-CM | POA: Diagnosis not present

## 2018-04-06 DIAGNOSIS — Z23 Encounter for immunization: Secondary | ICD-10-CM | POA: Diagnosis not present

## 2018-04-06 DIAGNOSIS — Z9221 Personal history of antineoplastic chemotherapy: Secondary | ICD-10-CM | POA: Diagnosis not present

## 2018-04-10 DIAGNOSIS — D0511 Intraductal carcinoma in situ of right breast: Secondary | ICD-10-CM | POA: Diagnosis not present

## 2018-04-10 DIAGNOSIS — Z9221 Personal history of antineoplastic chemotherapy: Secondary | ICD-10-CM | POA: Diagnosis not present

## 2018-04-10 DIAGNOSIS — N649 Disorder of breast, unspecified: Secondary | ICD-10-CM | POA: Diagnosis not present

## 2018-04-18 DIAGNOSIS — Z5111 Encounter for antineoplastic chemotherapy: Secondary | ICD-10-CM | POA: Diagnosis not present

## 2018-04-18 DIAGNOSIS — C50919 Malignant neoplasm of unspecified site of unspecified female breast: Secondary | ICD-10-CM | POA: Diagnosis not present

## 2018-04-18 DIAGNOSIS — C50911 Malignant neoplasm of unspecified site of right female breast: Secondary | ICD-10-CM | POA: Diagnosis not present

## 2018-04-18 DIAGNOSIS — C50912 Malignant neoplasm of unspecified site of left female breast: Secondary | ICD-10-CM | POA: Diagnosis not present

## 2018-04-18 DIAGNOSIS — Z01818 Encounter for other preprocedural examination: Secondary | ICD-10-CM | POA: Diagnosis not present

## 2018-04-23 DIAGNOSIS — C50911 Malignant neoplasm of unspecified site of right female breast: Secondary | ICD-10-CM | POA: Diagnosis not present

## 2018-04-23 DIAGNOSIS — Z01818 Encounter for other preprocedural examination: Secondary | ICD-10-CM | POA: Diagnosis not present

## 2018-04-24 DIAGNOSIS — D0511 Intraductal carcinoma in situ of right breast: Secondary | ICD-10-CM | POA: Diagnosis not present

## 2018-04-24 DIAGNOSIS — E119 Type 2 diabetes mellitus without complications: Secondary | ICD-10-CM | POA: Diagnosis not present

## 2018-04-24 DIAGNOSIS — C50912 Malignant neoplasm of unspecified site of left female breast: Secondary | ICD-10-CM | POA: Diagnosis not present

## 2018-04-25 DIAGNOSIS — C50411 Malignant neoplasm of upper-outer quadrant of right female breast: Secondary | ICD-10-CM | POA: Diagnosis not present

## 2018-04-25 DIAGNOSIS — C50812 Malignant neoplasm of overlapping sites of left female breast: Secondary | ICD-10-CM | POA: Diagnosis not present

## 2018-04-25 DIAGNOSIS — N6032 Fibrosclerosis of left breast: Secondary | ICD-10-CM | POA: Diagnosis not present

## 2018-04-25 DIAGNOSIS — C773 Secondary and unspecified malignant neoplasm of axilla and upper limb lymph nodes: Secondary | ICD-10-CM | POA: Diagnosis not present

## 2018-04-25 DIAGNOSIS — I9589 Other hypotension: Secondary | ICD-10-CM | POA: Diagnosis not present

## 2018-04-25 DIAGNOSIS — Z9989 Dependence on other enabling machines and devices: Secondary | ICD-10-CM | POA: Diagnosis not present

## 2018-04-25 DIAGNOSIS — I9581 Postprocedural hypotension: Secondary | ICD-10-CM | POA: Diagnosis not present

## 2018-04-25 DIAGNOSIS — G4733 Obstructive sleep apnea (adult) (pediatric): Secondary | ICD-10-CM | POA: Diagnosis not present

## 2018-04-25 DIAGNOSIS — N6021 Fibroadenosis of right breast: Secondary | ICD-10-CM | POA: Diagnosis not present

## 2018-04-25 DIAGNOSIS — Z170 Estrogen receptor positive status [ER+]: Secondary | ICD-10-CM | POA: Diagnosis not present

## 2018-04-25 DIAGNOSIS — J984 Other disorders of lung: Secondary | ICD-10-CM | POA: Diagnosis not present

## 2018-04-25 DIAGNOSIS — I9788 Other intraoperative complications of the circulatory system, not elsewhere classified: Secondary | ICD-10-CM | POA: Diagnosis not present

## 2018-04-25 DIAGNOSIS — Z48817 Encounter for surgical aftercare following surgery on the skin and subcutaneous tissue: Secondary | ICD-10-CM | POA: Diagnosis not present

## 2018-04-25 DIAGNOSIS — C50811 Malignant neoplasm of overlapping sites of right female breast: Secondary | ICD-10-CM | POA: Diagnosis not present

## 2018-04-26 DIAGNOSIS — C50411 Malignant neoplasm of upper-outer quadrant of right female breast: Secondary | ICD-10-CM | POA: Diagnosis not present

## 2018-04-26 DIAGNOSIS — J952 Acute pulmonary insufficiency following nonthoracic surgery: Secondary | ICD-10-CM | POA: Diagnosis not present

## 2018-04-26 DIAGNOSIS — D242 Benign neoplasm of left breast: Secondary | ICD-10-CM | POA: Diagnosis not present

## 2018-04-26 DIAGNOSIS — I9581 Postprocedural hypotension: Secondary | ICD-10-CM | POA: Diagnosis not present

## 2018-04-27 DIAGNOSIS — Z0389 Encounter for observation for other suspected diseases and conditions ruled out: Secondary | ICD-10-CM | POA: Diagnosis not present

## 2018-04-27 DIAGNOSIS — D0511 Intraductal carcinoma in situ of right breast: Secondary | ICD-10-CM | POA: Diagnosis not present

## 2018-05-25 DIAGNOSIS — R011 Cardiac murmur, unspecified: Secondary | ICD-10-CM | POA: Diagnosis not present

## 2018-05-25 DIAGNOSIS — E669 Obesity, unspecified: Secondary | ICD-10-CM | POA: Diagnosis not present

## 2018-05-25 DIAGNOSIS — C50919 Malignant neoplasm of unspecified site of unspecified female breast: Secondary | ICD-10-CM | POA: Diagnosis not present

## 2018-05-25 DIAGNOSIS — E119 Type 2 diabetes mellitus without complications: Secondary | ICD-10-CM | POA: Diagnosis not present

## 2018-06-06 DIAGNOSIS — I517 Cardiomegaly: Secondary | ICD-10-CM | POA: Diagnosis not present

## 2018-06-06 DIAGNOSIS — C50911 Malignant neoplasm of unspecified site of right female breast: Secondary | ICD-10-CM | POA: Diagnosis not present

## 2018-06-06 DIAGNOSIS — K5900 Constipation, unspecified: Secondary | ICD-10-CM | POA: Diagnosis not present

## 2018-06-06 DIAGNOSIS — Z7984 Long term (current) use of oral hypoglycemic drugs: Secondary | ICD-10-CM | POA: Diagnosis not present

## 2018-06-06 DIAGNOSIS — Z733 Stress, not elsewhere classified: Secondary | ICD-10-CM | POA: Diagnosis not present

## 2018-06-06 DIAGNOSIS — E119 Type 2 diabetes mellitus without complications: Secondary | ICD-10-CM | POA: Diagnosis not present

## 2018-06-06 DIAGNOSIS — R590 Localized enlarged lymph nodes: Secondary | ICD-10-CM | POA: Diagnosis not present

## 2018-06-06 DIAGNOSIS — I361 Nonrheumatic tricuspid (valve) insufficiency: Secondary | ICD-10-CM | POA: Diagnosis not present

## 2018-06-07 DIAGNOSIS — N63 Unspecified lump in unspecified breast: Secondary | ICD-10-CM | POA: Diagnosis not present

## 2018-06-07 DIAGNOSIS — C50911 Malignant neoplasm of unspecified site of right female breast: Secondary | ICD-10-CM | POA: Diagnosis not present

## 2018-06-07 DIAGNOSIS — Z9012 Acquired absence of left breast and nipple: Secondary | ICD-10-CM | POA: Diagnosis not present

## 2018-06-07 DIAGNOSIS — C3412 Malignant neoplasm of upper lobe, left bronchus or lung: Secondary | ICD-10-CM | POA: Diagnosis not present

## 2018-06-19 DIAGNOSIS — C50512 Malignant neoplasm of lower-outer quadrant of left female breast: Secondary | ICD-10-CM | POA: Diagnosis not present

## 2018-06-29 DIAGNOSIS — M79603 Pain in arm, unspecified: Secondary | ICD-10-CM | POA: Diagnosis not present

## 2018-07-09 NOTE — Progress Notes (Signed)
Location of Breast Cancer:  Right Breast cancer    Bilateral MRI Breast 03/29/2018:  Right Breast: Dominant enhancing mass in the upper outer quadrant has decreased in size and intensity of enhancement.  It now measures 1.0 cm maximal diameter, compared to prior measurement of 2.5 cm.  The satellite nodule along its superolateral margin has also decreased in size now 0.5 cm diameter compared to 1.0 cm.  The metastatic right axillary tail lymph node has also decreased in size now 0.8 cm compared to 1.7 cm.  The right axillary adenopathy has markedly decreased with dominant superior axillary lymph nodes almost entirely resolved and residual upper normal size inferior axillary lymph nodes present.  Left Breast: Previously described mass in the lateral retroareolar region has markedly decreased in size and conspicuity with ill defined residual enhancing mass now measures 0.5 x 1 cm diameter compared to 1.1 x 1.1 cm.  The previously demonstrated enhancing mass in the upper outer quadrant has decreased in size, now barely apparent on axial image.  PET 11/22/2017: Moderate FDG uptake in the dominant mass in the right breast.  Multiple enlarged FDG right axillay lymph nodes.  MRI 11/15/2017: Dominant disease in right breast there were noted to be multiple additional small enhancing masses and clumped non-masslike areas of enhancement throughout the lateral right breast.  There was also a 1.1 cm enhancing mass in the left breast.  Histology per Pathology Report:   Receptor Status: ER(+), PR (-), Her2-neu (-), Ki-()  Did patient present with symptoms (if so, please note symptoms) or was this found on screening mammography?:   Past/Anticipated interventions by surgeon, if any: -Right Breast MRM with right axillary dissection 04/25/2018 This demonstrated residual invasive disease in the breast, multifocal with largest single focus measuring 2 cm.  Surgical margins negative.  Lymph node evaluation revealed  residual disease in 2/13 evaluated nodes.  -Left simple mastectomy with SLNB, no evidence of malignancy 04/25/2018.  Past/Anticipated interventions by medical oncology, if any: Chemotherapy  Chemo done in [place] -Neoadjuvant chemo with AC-T 12/07/2017, 12/22/2017-01/18/2018. -Paclitaxel 02/01/2018-03/15/2018   Lymphedema issues, if any:   No  Pain issues, if any:  Pain and tightness underneath armpits, and across chest.  BP 124/77 (BP Location: Left Arm, Patient Position: Sitting)   Pulse 79   Temp 98.2 F (36.8 C) (Oral)   Resp 20   Ht '5\' 4"'  (1.626 m)   Wt 201 lb (91.2 kg)   LMP 11/25/2011   SpO2 98%   BMI 34.50 kg/m    Wt Readings from Last 3 Encounters:  07/10/18 201 lb (91.2 kg)  10/06/17 213 lb 6.4 oz (96.8 kg)  05/16/17 211 lb (95.7 kg)    SAFETY ISSUES:  Prior radiation? No  Pacemaker/ICD? No  Possible current pregnancy? Oophorectomy  Is the patient on methotrexate? No  Current Complaints / other details:   -Had LUL lobectomy 02/2017- follows with Dr. Julien Nordmann for squamous cell     Cori Razor, RN 07/09/2018,11:12 AM

## 2018-07-10 ENCOUNTER — Ambulatory Visit
Admission: RE | Admit: 2018-07-10 | Discharge: 2018-07-10 | Disposition: A | Discharge status: Home / Self Care | Payer: No Typology Code available for payment source | Source: Ambulatory Visit | Attending: Radiation Oncology | Admitting: Radiation Oncology

## 2018-07-10 ENCOUNTER — Other Ambulatory Visit: Payer: Self-pay

## 2018-07-10 ENCOUNTER — Encounter: Payer: Self-pay | Admitting: Radiation Oncology

## 2018-07-10 VITALS — BP 124/77 | HR 79 | Temp 98.2°F | Resp 20 | Ht 64.0 in | Wt 201.0 lb

## 2018-07-10 DIAGNOSIS — J45909 Unspecified asthma, uncomplicated: Secondary | ICD-10-CM | POA: Insufficient documentation

## 2018-07-10 DIAGNOSIS — L905 Scar conditions and fibrosis of skin: Secondary | ICD-10-CM | POA: Insufficient documentation

## 2018-07-10 DIAGNOSIS — R011 Cardiac murmur, unspecified: Secondary | ICD-10-CM | POA: Insufficient documentation

## 2018-07-10 DIAGNOSIS — C50911 Malignant neoplasm of unspecified site of right female breast: Secondary | ICD-10-CM

## 2018-07-10 DIAGNOSIS — Z170 Estrogen receptor positive status [ER+]: Principal | ICD-10-CM

## 2018-07-10 DIAGNOSIS — Z8673 Personal history of transient ischemic attack (TIA), and cerebral infarction without residual deficits: Secondary | ICD-10-CM | POA: Diagnosis not present

## 2018-07-10 DIAGNOSIS — L908 Other atrophic disorders of skin: Secondary | ICD-10-CM | POA: Diagnosis not present

## 2018-07-10 DIAGNOSIS — C50411 Malignant neoplasm of upper-outer quadrant of right female breast: Secondary | ICD-10-CM | POA: Diagnosis not present

## 2018-07-10 DIAGNOSIS — Z79899 Other long term (current) drug therapy: Secondary | ICD-10-CM | POA: Insufficient documentation

## 2018-07-10 DIAGNOSIS — Z7982 Long term (current) use of aspirin: Secondary | ICD-10-CM | POA: Insufficient documentation

## 2018-07-10 DIAGNOSIS — E119 Type 2 diabetes mellitus without complications: Secondary | ICD-10-CM | POA: Insufficient documentation

## 2018-07-10 DIAGNOSIS — F17210 Nicotine dependence, cigarettes, uncomplicated: Secondary | ICD-10-CM | POA: Insufficient documentation

## 2018-07-10 DIAGNOSIS — K219 Gastro-esophageal reflux disease without esophagitis: Secondary | ICD-10-CM | POA: Insufficient documentation

## 2018-07-10 DIAGNOSIS — E7800 Pure hypercholesterolemia, unspecified: Secondary | ICD-10-CM | POA: Insufficient documentation

## 2018-07-10 DIAGNOSIS — C3492 Malignant neoplasm of unspecified part of left bronchus or lung: Secondary | ICD-10-CM

## 2018-07-10 DIAGNOSIS — F329 Major depressive disorder, single episode, unspecified: Secondary | ICD-10-CM | POA: Insufficient documentation

## 2018-07-10 DIAGNOSIS — G4700 Insomnia, unspecified: Secondary | ICD-10-CM | POA: Insufficient documentation

## 2018-07-10 DIAGNOSIS — C773 Secondary and unspecified malignant neoplasm of axilla and upper limb lymph nodes: Secondary | ICD-10-CM | POA: Diagnosis not present

## 2018-07-10 NOTE — Progress Notes (Signed)
Radiation Oncology         [phone] ________________________________  Name: Susan Farley        MRN: [id number]  Date of Service: 07/10/2018 DOB: June 15, 1961  PN:TIRWERX, No Pcp Per  Marquis Buggy, MD     REFERRING PHYSICIAN: Marquis Buggy, MD   DIAGNOSIS: The primary encounter diagnosis was Stage I squamous cell carcinoma of left lung ([place]). A diagnosis of Malignant neoplasm of right breast in female, estrogen receptor positive, unspecified site of breast [facility] was also pertinent to this visit.   HISTORY OF PRESENT ILLNESS: Susan Farley is a 58 y.o. female seen at the request of Dr. Raynelle Chary at [facility] for a right breast cancer.  The patient originally presented on screening imaging in April 2019 with right architectural distortion and diagnostic imaging revealed concerns for lymph node involvement including intramammary nodes on the right.  Biopsy of the right breast on 10/25/17 revealed a grade 2 invasive ductal carcinoma, ER positive, PR negative, HER2 negative.  She underwent an MRI of bilateral breasts on 11/15/2017 which revealed her known cancer in the right breast as well as abnormality in the right axillary tail measuring 1.7 cm, additional adenopathy measuring up to 2.8 cm was also seen in the right axilla, there were small areas of enhancement from the 9-10 o'clock positions extending to a total measurement of 7.5 cm.  In the left breast she had a 9 mm ovoid T2 mass at 1:00 without evidence of adenopathy.  She underwent additional biopsies in June 2019 her additional right biopsies were negative for cancer, and her left biopsy was negative as well.  She went on to receive neoadjuvant chemotherapy between 12/22/2017 and 03/15/2018 following neoadjuvant chemotherapy, her was neoadjuvant MRI scan on 03/29/2018 revealed interval improvement in the primary malignancy and adjacent satellite nodules in the upper outer quadrant as well as improvement in the axillary tail lesion and  adenopathy.  In the left breast did not interval treatment response to the suspected contralateral retroareolar lesion was also noted.  In retrospect the radiologist report says that this may have been a metastatic intramammary node in the contralateral breast.  She subsequently underwent surgical resection on 04/25/2018 where she underwent left mastectomy, and right modified radical mastectomy with axillary dissection.  Her final pathology in the right breast revealed a 2 cm residual focus of invasive ductal carcinoma, grade 2 with clear margins.  2 of the 13 sampled lymph nodes contained metastatic disease.  Her tumor was ER positive, progesterone negative, HER-2 negative.  Her left mastectomy site revealed benign breast tissue with radial scar/complex sclerosing lesion negative for in situ or invasive carcinoma, and her lymph node in the axilla on the left was also negative.  She has met with Dr. Raynelle Chary in [place], but has elected to have treatment closer to home.  Of note additional systemic therapy was discussed with the patient, but she has declined. She plans to begin antiestrogen therapy after adjuvant radiotherapy.   PREVIOUS RADIATION THERAPY: No   PAST MEDICAL HISTORY:  Past Medical History:  Diagnosis Date  . Asthma   . Colitis   . Depression   . Diabetes mellitus without complication ([place])   . GERD (gastroesophageal reflux disease)   . Heart murmur   . Hypercholesteremia   . Hypertension   . Retinitis pigmentosa   . Sleep apnea    doesnt wear BIPAP currently  . Stroke [facility]) Nov. 2015   no residual  PAST SURGICAL HISTORY: Past Surgical History:  Procedure Laterality Date  . BONE GRAFT HIP ILIAC CREST    . CATARACT EXTRACTION    . IUD REMOVAL    . LOBECTOMY Left 02/24/2017   Procedure: Left upper lobe LOBECTOMY;  Surgeon: Melrose Nakayama, MD;  Location: [place];  Service: Thoracic;  Laterality: Left;  . NODE DISSECTION Left 02/24/2017   Procedure: NODE DISSECTION;   Surgeon: Melrose Nakayama, MD;  Location: [place];  Service: Thoracic;  Laterality: Left;  . OOPHORECTOMY Right 2004   removal of cyst and ovary  . RETINAL CRYOABLATION Bilateral    patient unsure of whether she had this  . VIDEO ASSISTED THORACOSCOPY (VATS)/WEDGE RESECTION Left 02/24/2017   Procedure: Left VIDEO ASSISTED THORACOSCOPY (VATS)/WEDGE RESECTION;  Surgeon: Melrose Nakayama, MD;  [facility];  Service: Thoracic;  Laterality: Left;     FAMILY HISTORY:  Family History  Adopted: Yes  Problem Relation Age of Onset  . Diabetes Unknown        BIOLOGICAL AUNT  . Heart disease Unknown        BIOLOGICAL AUNT     SOCIAL HISTORY:  reports that she has been smoking cigarettes and e-cigarettes. She has a 40.00 pack-year smoking history. She has never used smokeless tobacco. She reports current alcohol use. She reports that she does not use drugs.   ALLERGIES: Shellfish allergy   MEDICATIONS:  Current Outpatient Medications  Medication Sig Dispense Refill  . acetaminophen (TYLENOL) 500 MG tablet Take 2 tablets (1,000 mg total) by mouth every 6 (six) hours as needed. 30 tablet 0  . amLODipine (NORVASC) 10 MG tablet Take 1 tablet (10 mg total) by mouth daily. 30 tablet 3  . aspirin EC 325 MG EC tablet Take 1 tablet (325 mg total) by mouth daily. 30 tablet 0  . [employer] FIBER GUMMIES PO Take 2 capsules by mouth daily.    Marland Kitchen dexamethasone (OZURDEX) 0.7 MG IMPL 0.7 mg by Intravitreal route every 3 (three) months.     . docusate sodium (COLACE) 100 MG capsule Take 200 mg by mouth at bedtime.     . dorzolamide (TRUSOPT) 2 % ophthalmic solution Place 1 drop into both eyes 2 (two) times daily.    . hydrochlorothiazide (HYDRODIURIL) 25 MG tablet Take 25 mg by mouth daily.    Marland Kitchen HYDROcodone-acetaminophen (NORCO/VICODIN) 5-325 MG tablet Take 1 tablet by mouth every 6 (six) hours as needed. 30 tablet 0  . lisinopril (PRINIVIL,ZESTRIL) 5 MG tablet Take 1 tablet (5 mg total) by mouth daily.  30 tablet 3  . metFORMIN (GLUCOPHAGE) 500 MG tablet Take 500 mg by mouth 2 (two) times daily with a meal.    . nicotine (NICODERM CQ - DOSED IN MG/24 HOURS) 21 mg/24hr patch Place 1 patch (21 mg total) onto the skin daily. 28 patch 3  . pantoprazole (PROTONIX) 40 MG tablet Take 80 mg by mouth daily.     . potassium chloride SA (K-DUR,KLOR-CON) 20 MEQ tablet Take 1 tablet (20 mEq total) by mouth daily. 30 tablet 3  . QUEtiapine (SEROQUEL) 25 MG tablet Take 25 mg by mouth at bedtime.    . rosuvastatin (CRESTOR) 20 MG tablet Take 20 mg by mouth every evening.    . timolol (BETIMOL) 0.5 % ophthalmic solution Place 1 drop into both eyes 2 (two) times daily.    . timolol (TIMOPTIC) 0.5 % ophthalmic solution AS DIRECTED INSTILL 1 DROP IN BOTH EYES TWICE A DAY  2  .  venlafaxine XR (EFFEXOR-XR) 37.5 MG 24 hr capsule Take 187.5 mg by mouth at bedtime.    . Vitamin D, Ergocalciferol, (DRISDOL) 50000 UNITS CAPS capsule Take 50,000 Units by mouth every 7 (seven) days.     No current facility-administered medications for this encounter.      REVIEW OF SYSTEMS: On review of systems, the patient reports that she is doing well overall. She denies any chest pain, shortness of breath, cough, fevers, chills, night sweats, unintended weight changes. She denies any bowel or bladder disturbances, and denies abdominal pain, nausea or vomiting. She denies any new musculoskeletal or joint aches or pains. A complete review of systems is obtained and is otherwise negative.     PHYSICAL EXAM:  Wt Readings from Last 3 Encounters:  10/06/17 213 lb 6.4 oz (96.8 kg)  05/16/17 211 lb (95.7 kg)  04/11/17 218 lb 1.6 oz (98.9 kg)   Temp Readings from Last 3 Encounters:  10/06/17 97.9 F (36.6 C) (Oral)  04/11/17 97.8 F (36.6 C) (Oral)  02/28/17 98.4 F (36.9 C) (Oral)   BP Readings from Last 3 Encounters:  10/06/17 125/77  05/16/17 112/75  04/11/17 133/68   Pulse Readings from Last 3 Encounters:  10/06/17 82    05/16/17 64  04/11/17 66     In general this is a well appearing African American female in no acute distress. She is alert and oriented x4 and appropriate throughout the examination. HEENT reveals that the patient is normocephalic, atraumatic. EOMs are intact. Skin is intact without any evidence of gross lesions. Cardiopulmonary assessment is negative for acute distress and she exhibits normal effort. Bilateral chest wall incision sites are well healed. She has some tightening of the middle of her right mastectomy scar.  ECOG = 0  0 - Asymptomatic (Fully active, able to carry on all predisease activities without restriction)  1 - Symptomatic but completely ambulatory (Restricted in physically strenuous activity but ambulatory and able to carry out work of a light or sedentary nature. For example, light housework, office work)  2 - Symptomatic, <50% in bed during the day (Ambulatory and capable of all self care but unable to carry out any work activities. Up and about more than 50% of waking hours)  3 - Symptomatic, >50% in bed, but not bedbound (Capable of only limited self-care, confined to bed or chair 50% or more of waking hours)  4 - Bedbound (Completely disabled. Cannot carry on any self-care. Totally confined to bed or chair)  5 - Death   Eustace Pen MM, Creech RH, Tormey DC, et al. [phone]). "Toxicity and response criteria of the [facility]". [place] Oncol. 5 (6): 649-55    LABORATORY DATA:  Lab Results  Component Value Date   WBC 8.0 10/04/2017   HGB 11.4 (L) 10/04/2017   HCT 35.6 10/04/2017   MCV 84.3 10/04/2017   PLT 294 10/04/2017   Lab Results  Component Value Date   NA 141 10/04/2017   K 3.7 10/04/2017   CL 108 10/04/2017   CO2 25 10/04/2017   Lab Results  Component Value Date   ALT 17 10/04/2017   AST 24 10/04/2017   ALKPHOS 82 10/04/2017   BILITOT 0.2 10/04/2017      RADIOGRAPHY: No results found.      IMPRESSION/PLAN: 1. 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 Dr. Lisbeth Renshaw discusses the pathology findings and reviews the nature of right breast disease. She has  completed her systemic therapy and her surgical healing. We discussed the rationale for radiotherapy. She is ready to proceed with adjuvant external radiotherapy to the breast followed by antiestrogen therapy. We discussed the risks, benefits, short, and long term effects of radiotherapy, and the patient is interested in proceeding. Dr. Lisbeth Renshaw discusses the delivery and logistics of radiotherapy and anticipates a course of 6 1/2 weeks of radiotherapy. We will see her back about 2 weeks after surgery to discuss the simulation process and anticipate we starting radiotherapy about 4-6 weeks after surgery.  2. Right skin tightening of mastectomy scar. I encouraged the patient to be seen by PT due to the tightening of her skin which I'm concerned could increase the risks of desquamation during radiotherapy. She will be referred for evaluation.   In a visit lasting 60 minutes, greater than 50% of the time was spent face to face discussing her case, and coordinating the patient's care.  The above documentation reflects my direct findings during this shared patient visit. Please see the separate note by Dr. Lisbeth Renshaw on this date for the remainder of the patient's plan of care.    Carola Rhine, PAC

## 2018-07-11 ENCOUNTER — Ambulatory Visit: Payer: Federal, State, Local not specified - PPO | Admitting: Rehabilitation

## 2018-07-12 ENCOUNTER — Encounter: Payer: Self-pay | Admitting: General Practice

## 2018-07-12 NOTE — Progress Notes (Signed)
[place] Psychosocial Distress Screening Clinical Social Work  Clinical Social Work was referred by distress screening protocol.  The patient scored a 8 on the Psychosocial Distress Thermometer which indicates moderate distress. Clinical Social Worker contacted patient by phone to assess for distress and other psychosocial needs. Unable to reach patient by phone, left detailed VM w information on [place], contact information and encouragement to call for support/resources as needed.   ONCBCN DISTRESS SCREENING 07/10/2018  Screening Type Initial Screening  Distress experienced in past week (1-10) 8  Practical problem type Food  Emotional problem type Depression;Adjusting to illness;Isolation/feeling alone;Boredom;Adjusting to appearance changes  Physical Problem type Tingling hands/feet    Clinical Social Worker follow up needed: No.  If yes, follow up plan:  Beverely Pace, [place], LCSW Clinical Social Worker Phone:  [phone]

## 2018-07-17 ENCOUNTER — Ambulatory Visit
Admission: RE | Admit: 2018-07-17 | Discharge: 2018-07-17 | Disposition: A | Discharge status: Home / Self Care | Payer: No Typology Code available for payment source | Source: Ambulatory Visit | Attending: Radiation Oncology | Admitting: Radiation Oncology

## 2018-07-17 DIAGNOSIS — C50411 Malignant neoplasm of upper-outer quadrant of right female breast: Secondary | ICD-10-CM | POA: Insufficient documentation

## 2018-07-17 DIAGNOSIS — Z510 Encounter for antineoplastic radiation therapy: Secondary | ICD-10-CM | POA: Diagnosis not present

## 2018-07-18 NOTE — Progress Notes (Signed)
  Radiation Oncology         [phone] ________________________________  Name: Susan Farley MRN: [id number]  Date: 07/17/2018  DOB: [birth year]  DIAGNOSIS:     ICD-10-CM   1. Carcinoma of upper-outer quadrant of female breast, right ([place]) C50.411      SIMULATION AND TREATMENT PLANNING NOTE  The patient presented for simulation prior to beginning her course of radiation treatment for her diagnosis of right-sided breast cancer. The patient was placed in a supine position on a breast board. A customized vac-lock bag was also constructed and this complex treatment device will be used on a daily basis during her treatment. In this fashion, a CT scan was obtained through the chest area and an isocenter was placed near the chest wall at the upper aspect of the right chest.  The patient will be planned to receive a course of radiation initially to a dose of 50.4 gray. This will consist of a 4 field technique targeting the right chest wall as well as the supraclavicular region. Therefore 2 customized medial and lateral tangent fields have been created targeting the chest wall, and also 2 additional customized fields have been designed to treat the supraclavicular region both with a right supraclavicular field and a right posterior axillary boost field. A forward planning/reduced field technique will also be evaluated to determine if this significantly improves the dose homogeneity of the overall plan. Therefore, additional customized blocks/fields may be necessary.  This initial treatment will be accomplished at 1.8 gray per fraction.   The initial plan will consist of a 3-D conformal technique. The target volume/scar, heart and lungs have been contoured and dose volume histograms of each of these structures will be evaluated as part of the 3-D conformal treatment planning process.   It is anticipated that the patient will then receive a 10 gray boost to the surgical scar. This will be accomplished at  2 gray per fraction. The final anticipated total dose therefore will correspond to 60.4 gray.    _______________________________   Jodelle Gross, MD, PhD

## 2018-07-18 NOTE — Progress Notes (Signed)
  Radiation Oncology         [phone] ________________________________  Name: Susan Farley MRN: [id number]  Date: 07/17/2018  DOB: 02-03-1961  Optical Surface Tracking Plan:  Since intensity modulated radiotherapy (IMRT) and 3D conformal radiation treatment methods are predicated on accurate and precise positioning for treatment, intrafraction motion monitoring is medically necessary to ensure accurate and safe treatment delivery.  The ability to quantify intrafraction motion without excessive ionizing radiation dose can only be performed with optical surface tracking. Accordingly, surface imaging offers the opportunity to obtain 3D measurements of patient position throughout IMRT and 3D treatments without excessive radiation exposure.  I am ordering optical surface tracking for this patient's upcoming course of radiotherapy. ________________________________  Kyung Rudd, MD 07/18/2018 5:31 PM    Reference:   Ursula Alert, J, et al. Surface imaging-based analysis of intrafraction motion for breast radiotherapy patients.Journal of [place], n. 6, nov. 2014. ISSN [id number].   Available at: <http://www.jacmp.org/index.php/jacmp/article/view/4957>.

## 2018-07-20 ENCOUNTER — Telehealth: Payer: Self-pay | Admitting: *Deleted

## 2018-07-20 DIAGNOSIS — Z510 Encounter for antineoplastic radiation therapy: Secondary | ICD-10-CM | POA: Diagnosis not present

## 2018-07-20 DIAGNOSIS — C50411 Malignant neoplasm of upper-outer quadrant of right female breast: Secondary | ICD-10-CM | POA: Diagnosis not present

## 2018-07-20 NOTE — Telephone Encounter (Signed)
Left a voicemail letting the patient know about the changes to the dates and times of her pictures.  New dates are as follows:  2/27 10:30 before her treatment 3/18 10:30 before treatment 4/23 10:15  Left my call back number in the event she has any questions.  Susan Farley. [name], BSN

## 2018-07-24 ENCOUNTER — Ambulatory Visit
Admission: RE | Admit: 2018-07-24 | Discharge: 2018-07-24 | Disposition: A | Discharge status: Home / Self Care | Payer: No Typology Code available for payment source | Source: Ambulatory Visit | Attending: Radiation Oncology | Admitting: Radiation Oncology

## 2018-07-24 DIAGNOSIS — Z510 Encounter for antineoplastic radiation therapy: Secondary | ICD-10-CM | POA: Diagnosis present

## 2018-07-24 DIAGNOSIS — C50411 Malignant neoplasm of upper-outer quadrant of right female breast: Secondary | ICD-10-CM | POA: Insufficient documentation

## 2018-07-25 ENCOUNTER — Ambulatory Visit
Admission: RE | Admit: 2018-07-25 | Discharge: 2018-07-25 | Disposition: A | Discharge status: Home / Self Care | Payer: No Typology Code available for payment source | Source: Ambulatory Visit | Attending: Radiation Oncology | Admitting: Radiation Oncology

## 2018-07-25 DIAGNOSIS — Z510 Encounter for antineoplastic radiation therapy: Secondary | ICD-10-CM | POA: Diagnosis not present

## 2018-07-25 DIAGNOSIS — C50411 Malignant neoplasm of upper-outer quadrant of right female breast: Secondary | ICD-10-CM | POA: Diagnosis not present

## 2018-07-26 ENCOUNTER — Ambulatory Visit
Admission: RE | Admit: 2018-07-26 | Discharge: 2018-07-26 | Disposition: A | Discharge status: Home / Self Care | Payer: No Typology Code available for payment source | Source: Ambulatory Visit | Attending: Radiation Oncology | Admitting: Radiation Oncology

## 2018-07-26 DIAGNOSIS — C50411 Malignant neoplasm of upper-outer quadrant of right female breast: Secondary | ICD-10-CM

## 2018-07-26 DIAGNOSIS — Z510 Encounter for antineoplastic radiation therapy: Secondary | ICD-10-CM | POA: Diagnosis not present

## 2018-07-26 MED ORDER — RADIAPLEXRX EX GEL
Freq: Once | CUTANEOUS | Status: AC
  Administered 2018-07-26: 18:00:00 via TOPICAL

## 2018-07-26 MED ORDER — ALRA NON-METALLIC DEODORANT (RAD-ONC)
1.0000 "application " | Freq: Once | TOPICAL | Status: AC
  Administered 2018-07-26: 1 via TOPICAL

## 2018-07-26 NOTE — Progress Notes (Signed)
Pt here for patient teaching.  Pt given Radiation and You booklet, skin care instructions, Alra deodorant and Radiaplex gel.  Reviewed areas of pertinence such as fatigue, hair loss, skin changes, breast tenderness and breast swelling . Pt able to give teach back of to pat skin and use unscented/gentle soap,apply Radiaplex bid, avoid applying anything to skin within 4 hours of treatment, avoid wearing an under wire bra and to use an electric razor if they must shave. Pt verbalizes understanding of information given and will contact nursing with any questions or concerns.     Susan Farley M. Shamela Haydon RN, BSN      

## 2018-07-27 ENCOUNTER — Ambulatory Visit
Admission: RE | Admit: 2018-07-27 | Discharge: 2018-07-27 | Disposition: A | Discharge status: Home / Self Care | Payer: No Typology Code available for payment source | Source: Ambulatory Visit | Attending: Radiation Oncology | Admitting: Radiation Oncology

## 2018-07-27 DIAGNOSIS — C50411 Malignant neoplasm of upper-outer quadrant of right female breast: Secondary | ICD-10-CM | POA: Diagnosis not present

## 2018-07-27 DIAGNOSIS — Z510 Encounter for antineoplastic radiation therapy: Secondary | ICD-10-CM | POA: Diagnosis not present

## 2018-07-30 ENCOUNTER — Ambulatory Visit
Admission: RE | Admit: 2018-07-30 | Discharge: 2018-07-30 | Disposition: A | Discharge status: Home / Self Care | Payer: No Typology Code available for payment source | Source: Ambulatory Visit | Attending: Radiation Oncology | Admitting: Radiation Oncology

## 2018-07-30 DIAGNOSIS — C50411 Malignant neoplasm of upper-outer quadrant of right female breast: Secondary | ICD-10-CM | POA: Diagnosis not present

## 2018-07-30 DIAGNOSIS — Z510 Encounter for antineoplastic radiation therapy: Secondary | ICD-10-CM | POA: Diagnosis not present

## 2018-07-31 ENCOUNTER — Ambulatory Visit
Admission: RE | Admit: 2018-07-31 | Discharge: 2018-07-31 | Disposition: A | Discharge status: Home / Self Care | Payer: No Typology Code available for payment source | Source: Ambulatory Visit | Attending: Radiation Oncology | Admitting: Radiation Oncology

## 2018-07-31 DIAGNOSIS — C50411 Malignant neoplasm of upper-outer quadrant of right female breast: Secondary | ICD-10-CM | POA: Diagnosis not present

## 2018-07-31 DIAGNOSIS — Z510 Encounter for antineoplastic radiation therapy: Secondary | ICD-10-CM | POA: Diagnosis not present

## 2018-08-01 ENCOUNTER — Ambulatory Visit
Admission: RE | Admit: 2018-08-01 | Discharge: 2018-08-01 | Disposition: A | Discharge status: Home / Self Care | Payer: No Typology Code available for payment source | Source: Ambulatory Visit | Attending: Radiation Oncology | Admitting: Radiation Oncology

## 2018-08-01 DIAGNOSIS — C50411 Malignant neoplasm of upper-outer quadrant of right female breast: Secondary | ICD-10-CM | POA: Diagnosis not present

## 2018-08-01 DIAGNOSIS — Z510 Encounter for antineoplastic radiation therapy: Secondary | ICD-10-CM | POA: Diagnosis not present

## 2018-08-02 ENCOUNTER — Ambulatory Visit: Payer: No Typology Code available for payment source

## 2018-08-03 ENCOUNTER — Ambulatory Visit
Admission: RE | Admit: 2018-08-03 | Discharge: 2018-08-03 | Disposition: A | Discharge status: Home / Self Care | Payer: No Typology Code available for payment source | Source: Ambulatory Visit | Attending: Radiation Oncology | Admitting: Radiation Oncology

## 2018-08-03 DIAGNOSIS — C50411 Malignant neoplasm of upper-outer quadrant of right female breast: Secondary | ICD-10-CM | POA: Diagnosis not present

## 2018-08-03 DIAGNOSIS — Z510 Encounter for antineoplastic radiation therapy: Secondary | ICD-10-CM | POA: Diagnosis not present

## 2018-08-06 ENCOUNTER — Ambulatory Visit
Admission: RE | Admit: 2018-08-06 | Discharge: 2018-08-06 | Disposition: A | Discharge status: Home / Self Care | Payer: No Typology Code available for payment source | Source: Ambulatory Visit | Attending: Radiation Oncology | Admitting: Radiation Oncology

## 2018-08-06 DIAGNOSIS — C50411 Malignant neoplasm of upper-outer quadrant of right female breast: Secondary | ICD-10-CM | POA: Diagnosis not present

## 2018-08-06 DIAGNOSIS — Z510 Encounter for antineoplastic radiation therapy: Secondary | ICD-10-CM | POA: Diagnosis not present

## 2018-08-07 ENCOUNTER — Ambulatory Visit
Admission: RE | Admit: 2018-08-07 | Discharge: 2018-08-07 | Disposition: A | Discharge status: Home / Self Care | Payer: No Typology Code available for payment source | Source: Ambulatory Visit | Attending: Radiation Oncology | Admitting: Radiation Oncology

## 2018-08-07 DIAGNOSIS — Z510 Encounter for antineoplastic radiation therapy: Secondary | ICD-10-CM | POA: Diagnosis not present

## 2018-08-07 DIAGNOSIS — C50411 Malignant neoplasm of upper-outer quadrant of right female breast: Secondary | ICD-10-CM | POA: Diagnosis not present

## 2018-08-08 ENCOUNTER — Ambulatory Visit
Admission: RE | Admit: 2018-08-08 | Discharge: 2018-08-08 | Disposition: A | Discharge status: Home / Self Care | Payer: No Typology Code available for payment source | Source: Ambulatory Visit | Attending: Radiation Oncology | Admitting: Radiation Oncology

## 2018-08-08 DIAGNOSIS — C50411 Malignant neoplasm of upper-outer quadrant of right female breast: Secondary | ICD-10-CM | POA: Diagnosis not present

## 2018-08-08 DIAGNOSIS — Z510 Encounter for antineoplastic radiation therapy: Secondary | ICD-10-CM | POA: Diagnosis not present

## 2018-08-09 ENCOUNTER — Ambulatory Visit
Admission: RE | Admit: 2018-08-09 | Discharge: 2018-08-09 | Disposition: A | Discharge status: Home / Self Care | Payer: No Typology Code available for payment source | Source: Ambulatory Visit | Attending: Radiation Oncology | Admitting: Radiation Oncology

## 2018-08-09 DIAGNOSIS — Z510 Encounter for antineoplastic radiation therapy: Secondary | ICD-10-CM | POA: Diagnosis not present

## 2018-08-09 DIAGNOSIS — C50411 Malignant neoplasm of upper-outer quadrant of right female breast: Secondary | ICD-10-CM | POA: Diagnosis not present

## 2018-08-10 ENCOUNTER — Ambulatory Visit
Admission: RE | Admit: 2018-08-10 | Discharge: 2018-08-10 | Disposition: A | Discharge status: Home / Self Care | Payer: No Typology Code available for payment source | Source: Ambulatory Visit | Attending: Radiation Oncology | Admitting: Radiation Oncology

## 2018-08-10 DIAGNOSIS — C50411 Malignant neoplasm of upper-outer quadrant of right female breast: Secondary | ICD-10-CM | POA: Diagnosis not present

## 2018-08-10 DIAGNOSIS — Z510 Encounter for antineoplastic radiation therapy: Secondary | ICD-10-CM | POA: Diagnosis not present

## 2018-08-13 ENCOUNTER — Ambulatory Visit
Admission: RE | Admit: 2018-08-13 | Discharge: 2018-08-13 | Disposition: A | Discharge status: Home / Self Care | Payer: No Typology Code available for payment source | Source: Ambulatory Visit | Attending: Radiation Oncology | Admitting: Radiation Oncology

## 2018-08-13 DIAGNOSIS — Z510 Encounter for antineoplastic radiation therapy: Secondary | ICD-10-CM | POA: Diagnosis not present

## 2018-08-13 DIAGNOSIS — C50411 Malignant neoplasm of upper-outer quadrant of right female breast: Secondary | ICD-10-CM | POA: Diagnosis not present

## 2018-08-13 DIAGNOSIS — C50512 Malignant neoplasm of lower-outer quadrant of left female breast: Secondary | ICD-10-CM | POA: Diagnosis not present

## 2018-08-14 ENCOUNTER — Ambulatory Visit
Admission: RE | Admit: 2018-08-14 | Discharge: 2018-08-14 | Disposition: A | Discharge status: Home / Self Care | Payer: No Typology Code available for payment source | Source: Ambulatory Visit | Attending: Radiation Oncology | Admitting: Radiation Oncology

## 2018-08-14 DIAGNOSIS — E113292 Type 2 diabetes mellitus with mild nonproliferative diabetic retinopathy without macular edema, left eye: Secondary | ICD-10-CM | POA: Diagnosis not present

## 2018-08-14 DIAGNOSIS — Z510 Encounter for antineoplastic radiation therapy: Secondary | ICD-10-CM | POA: Diagnosis not present

## 2018-08-14 DIAGNOSIS — C50411 Malignant neoplasm of upper-outer quadrant of right female breast: Secondary | ICD-10-CM | POA: Diagnosis not present

## 2018-08-14 DIAGNOSIS — H35373 Puckering of macula, bilateral: Secondary | ICD-10-CM | POA: Diagnosis not present

## 2018-08-14 DIAGNOSIS — E113391 Type 2 diabetes mellitus with moderate nonproliferative diabetic retinopathy without macular edema, right eye: Secondary | ICD-10-CM | POA: Diagnosis not present

## 2018-08-14 DIAGNOSIS — H3552 Pigmentary retinal dystrophy: Secondary | ICD-10-CM | POA: Diagnosis not present

## 2018-08-15 ENCOUNTER — Ambulatory Visit
Admission: RE | Admit: 2018-08-15 | Discharge: 2018-08-15 | Disposition: A | Discharge status: Home / Self Care | Payer: No Typology Code available for payment source | Source: Ambulatory Visit | Attending: Radiation Oncology | Admitting: Radiation Oncology

## 2018-08-15 DIAGNOSIS — Z510 Encounter for antineoplastic radiation therapy: Secondary | ICD-10-CM | POA: Diagnosis not present

## 2018-08-15 DIAGNOSIS — C50411 Malignant neoplasm of upper-outer quadrant of right female breast: Secondary | ICD-10-CM | POA: Diagnosis not present

## 2018-08-16 ENCOUNTER — Ambulatory Visit
Admission: RE | Admit: 2018-08-16 | Discharge: 2018-08-16 | Disposition: A | Discharge status: Home / Self Care | Payer: No Typology Code available for payment source | Source: Ambulatory Visit | Attending: Radiation Oncology | Admitting: Radiation Oncology

## 2018-08-16 DIAGNOSIS — Z510 Encounter for antineoplastic radiation therapy: Secondary | ICD-10-CM | POA: Diagnosis not present

## 2018-08-16 DIAGNOSIS — C50411 Malignant neoplasm of upper-outer quadrant of right female breast: Secondary | ICD-10-CM | POA: Diagnosis not present

## 2018-08-17 ENCOUNTER — Ambulatory Visit
Admission: RE | Admit: 2018-08-17 | Discharge: 2018-08-17 | Disposition: A | Discharge status: Home / Self Care | Payer: No Typology Code available for payment source | Source: Ambulatory Visit | Attending: Radiation Oncology | Admitting: Radiation Oncology

## 2018-08-17 DIAGNOSIS — Z510 Encounter for antineoplastic radiation therapy: Secondary | ICD-10-CM | POA: Diagnosis not present

## 2018-08-17 DIAGNOSIS — C50411 Malignant neoplasm of upper-outer quadrant of right female breast: Secondary | ICD-10-CM | POA: Diagnosis not present

## 2018-08-20 ENCOUNTER — Ambulatory Visit
Admission: RE | Admit: 2018-08-20 | Discharge: 2018-08-20 | Disposition: A | Discharge status: Home / Self Care | Payer: No Typology Code available for payment source | Source: Ambulatory Visit | Attending: Radiation Oncology | Admitting: Radiation Oncology

## 2018-08-20 DIAGNOSIS — C50411 Malignant neoplasm of upper-outer quadrant of right female breast: Secondary | ICD-10-CM | POA: Diagnosis not present

## 2018-08-20 DIAGNOSIS — Z510 Encounter for antineoplastic radiation therapy: Secondary | ICD-10-CM | POA: Insufficient documentation

## 2018-08-21 ENCOUNTER — Ambulatory Visit
Admission: RE | Admit: 2018-08-21 | Discharge: 2018-08-21 | Disposition: A | Discharge status: Home / Self Care | Payer: No Typology Code available for payment source | Source: Ambulatory Visit | Attending: Radiation Oncology | Admitting: Radiation Oncology

## 2018-08-21 DIAGNOSIS — C50411 Malignant neoplasm of upper-outer quadrant of right female breast: Secondary | ICD-10-CM | POA: Diagnosis not present

## 2018-08-21 DIAGNOSIS — Z510 Encounter for antineoplastic radiation therapy: Secondary | ICD-10-CM | POA: Diagnosis not present

## 2018-08-22 ENCOUNTER — Ambulatory Visit
Admission: RE | Admit: 2018-08-22 | Discharge: 2018-08-22 | Disposition: A | Discharge status: Home / Self Care | Payer: No Typology Code available for payment source | Source: Ambulatory Visit | Attending: Radiation Oncology | Admitting: Radiation Oncology

## 2018-08-22 DIAGNOSIS — C50411 Malignant neoplasm of upper-outer quadrant of right female breast: Secondary | ICD-10-CM | POA: Diagnosis not present

## 2018-08-22 DIAGNOSIS — Z510 Encounter for antineoplastic radiation therapy: Secondary | ICD-10-CM | POA: Diagnosis not present

## 2018-08-23 ENCOUNTER — Ambulatory Visit
Admission: RE | Admit: 2018-08-23 | Discharge: 2018-08-23 | Disposition: A | Discharge status: Home / Self Care | Payer: No Typology Code available for payment source | Source: Ambulatory Visit | Attending: Radiation Oncology | Admitting: Radiation Oncology

## 2018-08-23 DIAGNOSIS — C50411 Malignant neoplasm of upper-outer quadrant of right female breast: Secondary | ICD-10-CM | POA: Diagnosis not present

## 2018-08-23 DIAGNOSIS — Z510 Encounter for antineoplastic radiation therapy: Secondary | ICD-10-CM | POA: Diagnosis not present

## 2018-08-24 ENCOUNTER — Ambulatory Visit
Admission: RE | Admit: 2018-08-24 | Discharge: 2018-08-24 | Disposition: A | Discharge status: Home / Self Care | Payer: No Typology Code available for payment source | Source: Ambulatory Visit | Attending: Radiation Oncology | Admitting: Radiation Oncology

## 2018-08-24 DIAGNOSIS — C50411 Malignant neoplasm of upper-outer quadrant of right female breast: Secondary | ICD-10-CM | POA: Diagnosis not present

## 2018-08-24 DIAGNOSIS — Z510 Encounter for antineoplastic radiation therapy: Secondary | ICD-10-CM | POA: Diagnosis not present

## 2018-08-27 ENCOUNTER — Ambulatory Visit
Admission: RE | Admit: 2018-08-27 | Discharge: 2018-08-27 | Disposition: A | Discharge status: Home / Self Care | Payer: No Typology Code available for payment source | Source: Ambulatory Visit | Attending: Radiation Oncology | Admitting: Radiation Oncology

## 2018-08-27 DIAGNOSIS — C50411 Malignant neoplasm of upper-outer quadrant of right female breast: Secondary | ICD-10-CM | POA: Diagnosis not present

## 2018-08-27 DIAGNOSIS — Z510 Encounter for antineoplastic radiation therapy: Secondary | ICD-10-CM | POA: Diagnosis not present

## 2018-08-28 ENCOUNTER — Ambulatory Visit
Admission: RE | Admit: 2018-08-28 | Discharge: 2018-08-28 | Disposition: A | Discharge status: Home / Self Care | Payer: No Typology Code available for payment source | Source: Ambulatory Visit | Attending: Radiation Oncology | Admitting: Radiation Oncology

## 2018-08-28 ENCOUNTER — Telehealth: Payer: Self-pay | Admitting: *Deleted

## 2018-08-28 DIAGNOSIS — C50411 Malignant neoplasm of upper-outer quadrant of right female breast: Secondary | ICD-10-CM | POA: Diagnosis not present

## 2018-08-28 DIAGNOSIS — Z510 Encounter for antineoplastic radiation therapy: Secondary | ICD-10-CM | POA: Diagnosis not present

## 2018-08-28 NOTE — Telephone Encounter (Signed)
On 08-28-18 [facility] it was consult note , sim and planning note.

## 2018-08-29 ENCOUNTER — Ambulatory Visit
Admission: RE | Admit: 2018-08-29 | Discharge: 2018-08-29 | Disposition: A | Discharge status: Home / Self Care | Payer: No Typology Code available for payment source | Source: Ambulatory Visit | Attending: Radiation Oncology | Admitting: Radiation Oncology

## 2018-08-29 ENCOUNTER — Other Ambulatory Visit: Payer: Self-pay

## 2018-08-29 DIAGNOSIS — Z510 Encounter for antineoplastic radiation therapy: Secondary | ICD-10-CM | POA: Diagnosis not present

## 2018-08-29 DIAGNOSIS — C50411 Malignant neoplasm of upper-outer quadrant of right female breast: Secondary | ICD-10-CM | POA: Diagnosis not present

## 2018-08-30 ENCOUNTER — Ambulatory Visit
Admission: RE | Admit: 2018-08-30 | Discharge: 2018-08-30 | Disposition: A | Discharge status: Home / Self Care | Payer: No Typology Code available for payment source | Source: Ambulatory Visit | Attending: Radiation Oncology | Admitting: Radiation Oncology

## 2018-08-30 DIAGNOSIS — Z510 Encounter for antineoplastic radiation therapy: Secondary | ICD-10-CM | POA: Diagnosis not present

## 2018-08-30 DIAGNOSIS — C50411 Malignant neoplasm of upper-outer quadrant of right female breast: Secondary | ICD-10-CM | POA: Diagnosis not present

## 2018-08-31 ENCOUNTER — Ambulatory Visit
Admission: RE | Admit: 2018-08-31 | Discharge: 2018-08-31 | Disposition: A | Discharge status: Home / Self Care | Payer: No Typology Code available for payment source | Source: Ambulatory Visit | Attending: Radiation Oncology | Admitting: Radiation Oncology

## 2018-08-31 ENCOUNTER — Other Ambulatory Visit: Payer: Self-pay | Admitting: Radiation Oncology

## 2018-08-31 DIAGNOSIS — C50411 Malignant neoplasm of upper-outer quadrant of right female breast: Secondary | ICD-10-CM | POA: Diagnosis not present

## 2018-08-31 DIAGNOSIS — Z510 Encounter for antineoplastic radiation therapy: Secondary | ICD-10-CM | POA: Diagnosis not present

## 2018-08-31 MED ORDER — HYDROMORPHONE HCL 2 MG PO TABS: 2.0000 mg | ORAL_TABLET | ORAL | 0 refills | Status: DC | PRN

## 2018-09-03 ENCOUNTER — Ambulatory Visit: Payer: No Typology Code available for payment source

## 2018-09-03 ENCOUNTER — Other Ambulatory Visit: Payer: Self-pay

## 2018-09-03 ENCOUNTER — Ambulatory Visit
Admission: RE | Admit: 2018-09-03 | Discharge: 2018-09-03 | Disposition: A | Discharge status: Home / Self Care | Payer: No Typology Code available for payment source | Source: Ambulatory Visit | Attending: Radiation Oncology | Admitting: Radiation Oncology

## 2018-09-03 DIAGNOSIS — C50411 Malignant neoplasm of upper-outer quadrant of right female breast: Secondary | ICD-10-CM | POA: Diagnosis not present

## 2018-09-03 DIAGNOSIS — Z510 Encounter for antineoplastic radiation therapy: Secondary | ICD-10-CM | POA: Diagnosis not present

## 2018-09-04 ENCOUNTER — Other Ambulatory Visit: Payer: Self-pay

## 2018-09-04 ENCOUNTER — Ambulatory Visit: Payer: No Typology Code available for payment source

## 2018-09-04 ENCOUNTER — Ambulatory Visit
Admission: RE | Admit: 2018-09-04 | Discharge: 2018-09-04 | Disposition: A | Discharge status: Home / Self Care | Payer: No Typology Code available for payment source | Source: Ambulatory Visit | Attending: Radiation Oncology | Admitting: Radiation Oncology

## 2018-09-04 DIAGNOSIS — Z510 Encounter for antineoplastic radiation therapy: Secondary | ICD-10-CM | POA: Diagnosis not present

## 2018-09-04 DIAGNOSIS — C50411 Malignant neoplasm of upper-outer quadrant of right female breast: Secondary | ICD-10-CM | POA: Diagnosis not present

## 2018-09-05 ENCOUNTER — Other Ambulatory Visit: Payer: Self-pay

## 2018-09-05 ENCOUNTER — Ambulatory Visit
Admission: RE | Admit: 2018-09-05 | Discharge: 2018-09-05 | Disposition: A | Discharge status: Home / Self Care | Payer: No Typology Code available for payment source | Source: Ambulatory Visit | Attending: Radiation Oncology | Admitting: Radiation Oncology

## 2018-09-05 DIAGNOSIS — C50411 Malignant neoplasm of upper-outer quadrant of right female breast: Secondary | ICD-10-CM | POA: Diagnosis not present

## 2018-09-05 DIAGNOSIS — Z510 Encounter for antineoplastic radiation therapy: Secondary | ICD-10-CM | POA: Diagnosis not present

## 2018-09-06 ENCOUNTER — Ambulatory Visit
Admission: RE | Admit: 2018-09-06 | Discharge: 2018-09-06 | Disposition: A | Discharge status: Home / Self Care | Payer: No Typology Code available for payment source | Source: Ambulatory Visit | Attending: Radiation Oncology | Admitting: Radiation Oncology

## 2018-09-06 DIAGNOSIS — Z510 Encounter for antineoplastic radiation therapy: Secondary | ICD-10-CM | POA: Diagnosis not present

## 2018-09-06 DIAGNOSIS — C50411 Malignant neoplasm of upper-outer quadrant of right female breast: Secondary | ICD-10-CM | POA: Diagnosis not present

## 2018-09-07 ENCOUNTER — Ambulatory Visit: Payer: No Typology Code available for payment source

## 2018-09-07 ENCOUNTER — Other Ambulatory Visit: Payer: Self-pay

## 2018-09-07 ENCOUNTER — Ambulatory Visit
Admission: RE | Admit: 2018-09-07 | Discharge: 2018-09-07 | Disposition: A | Discharge status: Home / Self Care | Payer: No Typology Code available for payment source | Source: Ambulatory Visit | Attending: Radiation Oncology | Admitting: Radiation Oncology

## 2018-09-07 DIAGNOSIS — Z510 Encounter for antineoplastic radiation therapy: Secondary | ICD-10-CM | POA: Diagnosis not present

## 2018-09-07 DIAGNOSIS — C50411 Malignant neoplasm of upper-outer quadrant of right female breast: Secondary | ICD-10-CM | POA: Diagnosis not present

## 2018-09-10 ENCOUNTER — Other Ambulatory Visit: Payer: Self-pay

## 2018-09-10 ENCOUNTER — Encounter: Payer: Self-pay | Admitting: Radiation Oncology

## 2018-09-10 ENCOUNTER — Ambulatory Visit
Admission: RE | Admit: 2018-09-10 | Discharge: 2018-09-10 | Disposition: A | Discharge status: Home / Self Care | Payer: No Typology Code available for payment source | Source: Ambulatory Visit | Attending: Radiation Oncology | Admitting: Radiation Oncology

## 2018-09-10 DIAGNOSIS — Z510 Encounter for antineoplastic radiation therapy: Secondary | ICD-10-CM | POA: Diagnosis not present

## 2018-09-10 DIAGNOSIS — C50411 Malignant neoplasm of upper-outer quadrant of right female breast: Secondary | ICD-10-CM | POA: Diagnosis not present

## 2018-10-09 NOTE — Progress Notes (Signed)
  Radiation Oncology         [phone] ________________________________  Name: Susan Farley MRN: [id number]  Date: 09/10/2018  DOB: 1960-10-16  End of Treatment Note  Diagnosis:   58 y.o. 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 This was delivered using a 3-D conformal, 4 field technique. The patient then received a boost to the mastectomy scar. This delivered an additional 10 Gy in 5 fractions using an en face electron field. The total dose was 60.4 Gy.  Narrative: The patient tolerated radiation treatment relatively well.   The patient had some expected skin irritation with hyperpigmentation and dryness as she progressed during treatment. Moist desquamation was not present at the end of treatment. She continues to use her Radiaplex cream as prescribed. She also noted mild fatigue.  Plan: The patient has completed radiation treatment. The patient will return to radiation oncology clinic for routine followup in one month. I advised the patient to call or return sooner if they have any questions or concerns related to their recovery or treatment. ________________________________  Jodelle Gross, MD, PhD  This document serves as a record of services personally performed by Kyung Rudd, MD. It was created on his behalf by Rae Lips, a trained medical scribe. The creation of this record is based on the scribe's personal observations and the provider's statements to them. This document has been checked and approved by the attending provider.

## 2018-10-11 ENCOUNTER — Other Ambulatory Visit: Payer: Self-pay

## 2018-10-11 ENCOUNTER — Ambulatory Visit
Admission: RE | Admit: 2018-10-11 | Discharge: 2018-10-11 | Disposition: A | Discharge status: Home / Self Care | Payer: Non-veteran care | Source: Ambulatory Visit | Attending: Radiation Oncology | Admitting: Radiation Oncology

## 2018-10-11 DIAGNOSIS — C50411 Malignant neoplasm of upper-outer quadrant of right female breast: Secondary | ICD-10-CM

## 2018-10-11 NOTE — Progress Notes (Signed)
Radiation Oncology         [phone] ________________________________  Name: Susan Farley MRN: [id number]  Date of Service: 10/11/2018  DOB: [birth year]  Post Treatment Note  CC: Patient, No Pcp Per  Marquis Buggy, MD  Diagnosis:  Stage IIIA, [id number] grade 2 ER positive invasive ductal carcinoma of the right breast.  Interval Since Last Radiation:  4 weeks   07/25/2018 - 09/10/2018: The patient initially received a dose of 50.4 Gy in 28 fractions to the right chest wall and supraclavicular region. This was delivered using a 3-D conformal, 4 field technique. The patient then received a boost to the mastectomy scar. This delivered an additional 10 Gy in 5 fractions using an en face electron field. The total dose was 60.4 Gy.  Narrative:  The patient returns today for routine follow-up. During treatment she did very well with radiotherapy and did not have significant desquamation.                             On review of systems, the patient states she's doing well. Occasionally at night she feels fullness in the right axilla. She's wearing a compression t shirt but has not been evaluated by PT. She reports her skin has healed well but was quite sore a week after finishing treatment.   ALLERGIES:  is allergic to shellfish allergy.  Meds: Current Outpatient Medications  Medication Sig Dispense Refill  . acetaminophen (TYLENOL) 500 MG tablet Take 2 tablets (1,000 mg total) by mouth every 6 (six) hours as needed. 30 tablet 0  . amLODipine (NORVASC) 10 MG tablet Take 1 tablet (10 mg total) by mouth daily. 30 tablet 3  . anastrozole (ARIMIDEX) 1 MG tablet     . aspirin EC 325 MG EC tablet Take 1 tablet (325 mg total) by mouth daily. 30 tablet 0  . [employer] FIBER GUMMIES PO Take 2 capsules by mouth daily.    Marland Kitchen dexamethasone (OZURDEX) 0.7 MG IMPL 0.7 mg by Intravitreal route every 3 (three) months.     . docusate sodium (COLACE) 100 MG capsule Take 200 mg by mouth at bedtime.     .  dorzolamide (TRUSOPT) 2 % ophthalmic solution Place 1 drop into both eyes 2 (two) times daily.    Marland Kitchen EPINEPHrine 0.3 mg/0.3 mL IJ SOAJ injection     . fluticasone (FLONASE) 50 MCG/ACT nasal spray     . hydrochlorothiazide (HYDRODIURIL) 25 MG tablet Take 25 mg by mouth daily.    Marland Kitchen HYDROcodone-acetaminophen (NORCO/VICODIN) 5-325 MG tablet Take 1 tablet by mouth every 6 (six) hours as needed. 30 tablet 0  . HYDROmorphone (DILAUDID) 2 MG tablet Take 1-2 tablets (2-4 mg total) by mouth every 4 (four) hours as needed for severe pain. 30 tablet 0  . lisinopril (PRINIVIL,ZESTRIL) 5 MG tablet Take 1 tablet (5 mg total) by mouth daily. 30 tablet 3  . metFORMIN (GLUCOPHAGE) 1000 MG tablet     . metFORMIN (GLUCOPHAGE) 500 MG tablet Take 500 mg by mouth 2 (two) times daily with a meal.    . nystatin (MYCOSTATIN) 100000 UNIT/ML suspension     . ondansetron (ZOFRAN) 8 MG tablet     . oxyCODONE (OXY IR/ROXICODONE) 5 MG immediate release tablet     . pantoprazole (PROTONIX) 40 MG tablet Take 80 mg by mouth daily.     . potassium chloride SA (K-DUR,KLOR-CON) 20 MEQ tablet Take 1 tablet (20 mEq total) by  mouth daily. 30 tablet 3  . QUEtiapine (SEROQUEL) 25 MG tablet Take 25 mg by mouth at bedtime.    . rosuvastatin (CRESTOR) 20 MG tablet Take 20 mg by mouth every evening.    . timolol (BETIMOL) 0.5 % ophthalmic solution Place 1 drop into both eyes 2 (two) times daily.    . timolol (TIMOPTIC) 0.5 % ophthalmic solution AS DIRECTED INSTILL 1 DROP IN BOTH EYES TWICE A DAY  2  . venlafaxine XR (EFFEXOR-XR) 37.5 MG 24 hr capsule Take 187.5 mg by mouth at bedtime.     No current facility-administered medications for this encounter.     Physical Findings:  vitals were not taken for this visit.   /10 In general this is a well appearing African American female in no acute distress. She's alert and oriented x4 and appropriate throughout the examination. Cardiopulmonary assessment is negative for acute distress and she  exhibits normal effort. The right chest wall was examined and reveals mild hyperpigmentation and no desquamation or chest wall or RUE edema.   Lab Findings: Lab Results  Component Value Date   WBC 8.0 10/04/2017   HGB 11.4 (L) 10/04/2017   HCT 35.6 10/04/2017   MCV 84.3 10/04/2017   PLT 294 10/04/2017     Radiographic Findings: No results found.  Impression/Plan: 1. Stage IIIA, [id number] grade 2 ER positive invasive ductal carcinoma of the right breast. The patient has been doing well since completion of radiotherapy. We discussed that we would be happy to continue to follow her as needed, but she will also continue to follow up with  medical oncology at the [facility]. She was counseled on skin care as well as measures to avoid sun exposure to this area.  2. Right upper extremity fullness. No visible changes are noted today, but if her symptoms persist I've recommended PT evaluation. She will let us know if she desires a referral.      Carola Rhine, PAC

## 2018-10-31 DIAGNOSIS — G4733 Obstructive sleep apnea (adult) (pediatric): Secondary | ICD-10-CM | POA: Diagnosis not present

## 2018-11-01 DIAGNOSIS — M65332 Trigger finger, left middle finger: Secondary | ICD-10-CM | POA: Diagnosis not present

## 2018-11-01 DIAGNOSIS — M65331 Trigger finger, right middle finger: Secondary | ICD-10-CM | POA: Insufficient documentation

## 2018-11-01 DIAGNOSIS — M654 Radial styloid tenosynovitis [de Quervain]: Secondary | ICD-10-CM | POA: Insufficient documentation

## 2018-11-14 DIAGNOSIS — G4733 Obstructive sleep apnea (adult) (pediatric): Secondary | ICD-10-CM | POA: Diagnosis not present

## 2018-11-29 DIAGNOSIS — M65331 Trigger finger, right middle finger: Secondary | ICD-10-CM | POA: Diagnosis not present

## 2018-11-30 DIAGNOSIS — G4733 Obstructive sleep apnea (adult) (pediatric): Secondary | ICD-10-CM | POA: Diagnosis not present

## 2018-12-07 DIAGNOSIS — Z1159 Encounter for screening for other viral diseases: Secondary | ICD-10-CM | POA: Diagnosis not present

## 2018-12-07 DIAGNOSIS — D0511 Intraductal carcinoma in situ of right breast: Secondary | ICD-10-CM | POA: Diagnosis not present

## 2018-12-07 DIAGNOSIS — E119 Type 2 diabetes mellitus without complications: Secondary | ICD-10-CM | POA: Diagnosis not present

## 2018-12-07 DIAGNOSIS — Z9013 Acquired absence of bilateral breasts and nipples: Secondary | ICD-10-CM | POA: Diagnosis not present

## 2018-12-10 DIAGNOSIS — E119 Type 2 diabetes mellitus without complications: Secondary | ICD-10-CM | POA: Diagnosis not present

## 2018-12-10 DIAGNOSIS — J45909 Unspecified asthma, uncomplicated: Secondary | ICD-10-CM | POA: Diagnosis not present

## 2018-12-10 DIAGNOSIS — L853 Xerosis cutis: Secondary | ICD-10-CM | POA: Diagnosis not present

## 2018-12-10 DIAGNOSIS — F329 Major depressive disorder, single episode, unspecified: Secondary | ICD-10-CM | POA: Diagnosis not present

## 2018-12-30 DIAGNOSIS — G4733 Obstructive sleep apnea (adult) (pediatric): Secondary | ICD-10-CM | POA: Diagnosis not present

## 2019-01-03 DIAGNOSIS — C50919 Malignant neoplasm of unspecified site of unspecified female breast: Secondary | ICD-10-CM | POA: Diagnosis not present

## 2019-01-03 DIAGNOSIS — F339 Major depressive disorder, recurrent, unspecified: Secondary | ICD-10-CM | POA: Diagnosis not present

## 2019-01-03 DIAGNOSIS — C50911 Malignant neoplasm of unspecified site of right female breast: Secondary | ICD-10-CM | POA: Diagnosis not present

## 2019-01-03 DIAGNOSIS — C773 Secondary and unspecified malignant neoplasm of axilla and upper limb lymph nodes: Secondary | ICD-10-CM | POA: Diagnosis not present

## 2019-01-03 DIAGNOSIS — C3412 Malignant neoplasm of upper lobe, left bronchus or lung: Secondary | ICD-10-CM | POA: Diagnosis not present

## 2019-01-03 DIAGNOSIS — Z634 Disappearance and death of family member: Secondary | ICD-10-CM | POA: Diagnosis not present

## 2019-01-03 DIAGNOSIS — F4310 Post-traumatic stress disorder, unspecified: Secondary | ICD-10-CM | POA: Diagnosis not present

## 2019-01-03 DIAGNOSIS — Z483 Aftercare following surgery for neoplasm: Secondary | ICD-10-CM | POA: Diagnosis not present

## 2019-01-07 DIAGNOSIS — Z961 Presence of intraocular lens: Secondary | ICD-10-CM | POA: Diagnosis not present

## 2019-01-07 DIAGNOSIS — H0016 Chalazion left eye, unspecified eyelid: Secondary | ICD-10-CM | POA: Diagnosis not present

## 2019-01-18 DIAGNOSIS — E119 Type 2 diabetes mellitus without complications: Secondary | ICD-10-CM | POA: Diagnosis not present

## 2019-01-18 DIAGNOSIS — H401131 Primary open-angle glaucoma, bilateral, mild stage: Secondary | ICD-10-CM | POA: Diagnosis not present

## 2019-01-18 DIAGNOSIS — H0016 Chalazion left eye, unspecified eyelid: Secondary | ICD-10-CM | POA: Diagnosis not present

## 2019-01-22 DIAGNOSIS — I972 Postmastectomy lymphedema syndrome: Secondary | ICD-10-CM | POA: Diagnosis not present

## 2019-02-04 DIAGNOSIS — M654 Radial styloid tenosynovitis [de Quervain]: Secondary | ICD-10-CM | POA: Diagnosis not present

## 2019-02-28 DIAGNOSIS — E119 Type 2 diabetes mellitus without complications: Secondary | ICD-10-CM | POA: Diagnosis not present

## 2019-03-11 DIAGNOSIS — I972 Postmastectomy lymphedema syndrome: Secondary | ICD-10-CM | POA: Diagnosis not present

## 2019-03-12 DIAGNOSIS — M654 Radial styloid tenosynovitis [de Quervain]: Secondary | ICD-10-CM | POA: Diagnosis not present

## 2019-03-12 DIAGNOSIS — M65331 Trigger finger, right middle finger: Secondary | ICD-10-CM | POA: Diagnosis not present

## 2019-03-12 DIAGNOSIS — M65312 Trigger thumb, left thumb: Secondary | ICD-10-CM | POA: Diagnosis not present

## 2019-03-12 DIAGNOSIS — M79645 Pain in left finger(s): Secondary | ICD-10-CM | POA: Diagnosis not present

## 2019-03-20 DIAGNOSIS — R911 Solitary pulmonary nodule: Secondary | ICD-10-CM | POA: Diagnosis not present

## 2019-03-20 DIAGNOSIS — I972 Postmastectomy lymphedema syndrome: Secondary | ICD-10-CM | POA: Diagnosis not present

## 2019-03-27 DIAGNOSIS — Z853 Personal history of malignant neoplasm of breast: Secondary | ICD-10-CM | POA: Diagnosis not present

## 2019-03-27 DIAGNOSIS — Z13820 Encounter for screening for osteoporosis: Secondary | ICD-10-CM | POA: Diagnosis not present

## 2019-03-27 DIAGNOSIS — E119 Type 2 diabetes mellitus without complications: Secondary | ICD-10-CM | POA: Diagnosis not present

## 2019-04-03 DIAGNOSIS — M654 Radial styloid tenosynovitis [de Quervain]: Secondary | ICD-10-CM | POA: Diagnosis not present

## 2019-04-03 DIAGNOSIS — M65331 Trigger finger, right middle finger: Secondary | ICD-10-CM | POA: Diagnosis not present

## 2019-05-15 DIAGNOSIS — I972 Postmastectomy lymphedema syndrome: Secondary | ICD-10-CM | POA: Diagnosis not present

## 2019-09-10 IMAGING — CR DG CHEST 1V PORT
1 series · 1 of 1 positions shown · non-contrast
Comparison: 02/26/2017, 02/25/2017

CLINICAL DATA: 56-year-old female with a history of VATS
02/24/2017.

Left upper lobe squamous cell carcinoma, left VATS, wedge resection/
left upper lobectomy, lymph node dissection
EXAM:
PORTABLE CHEST 1 VIEW

[AP]
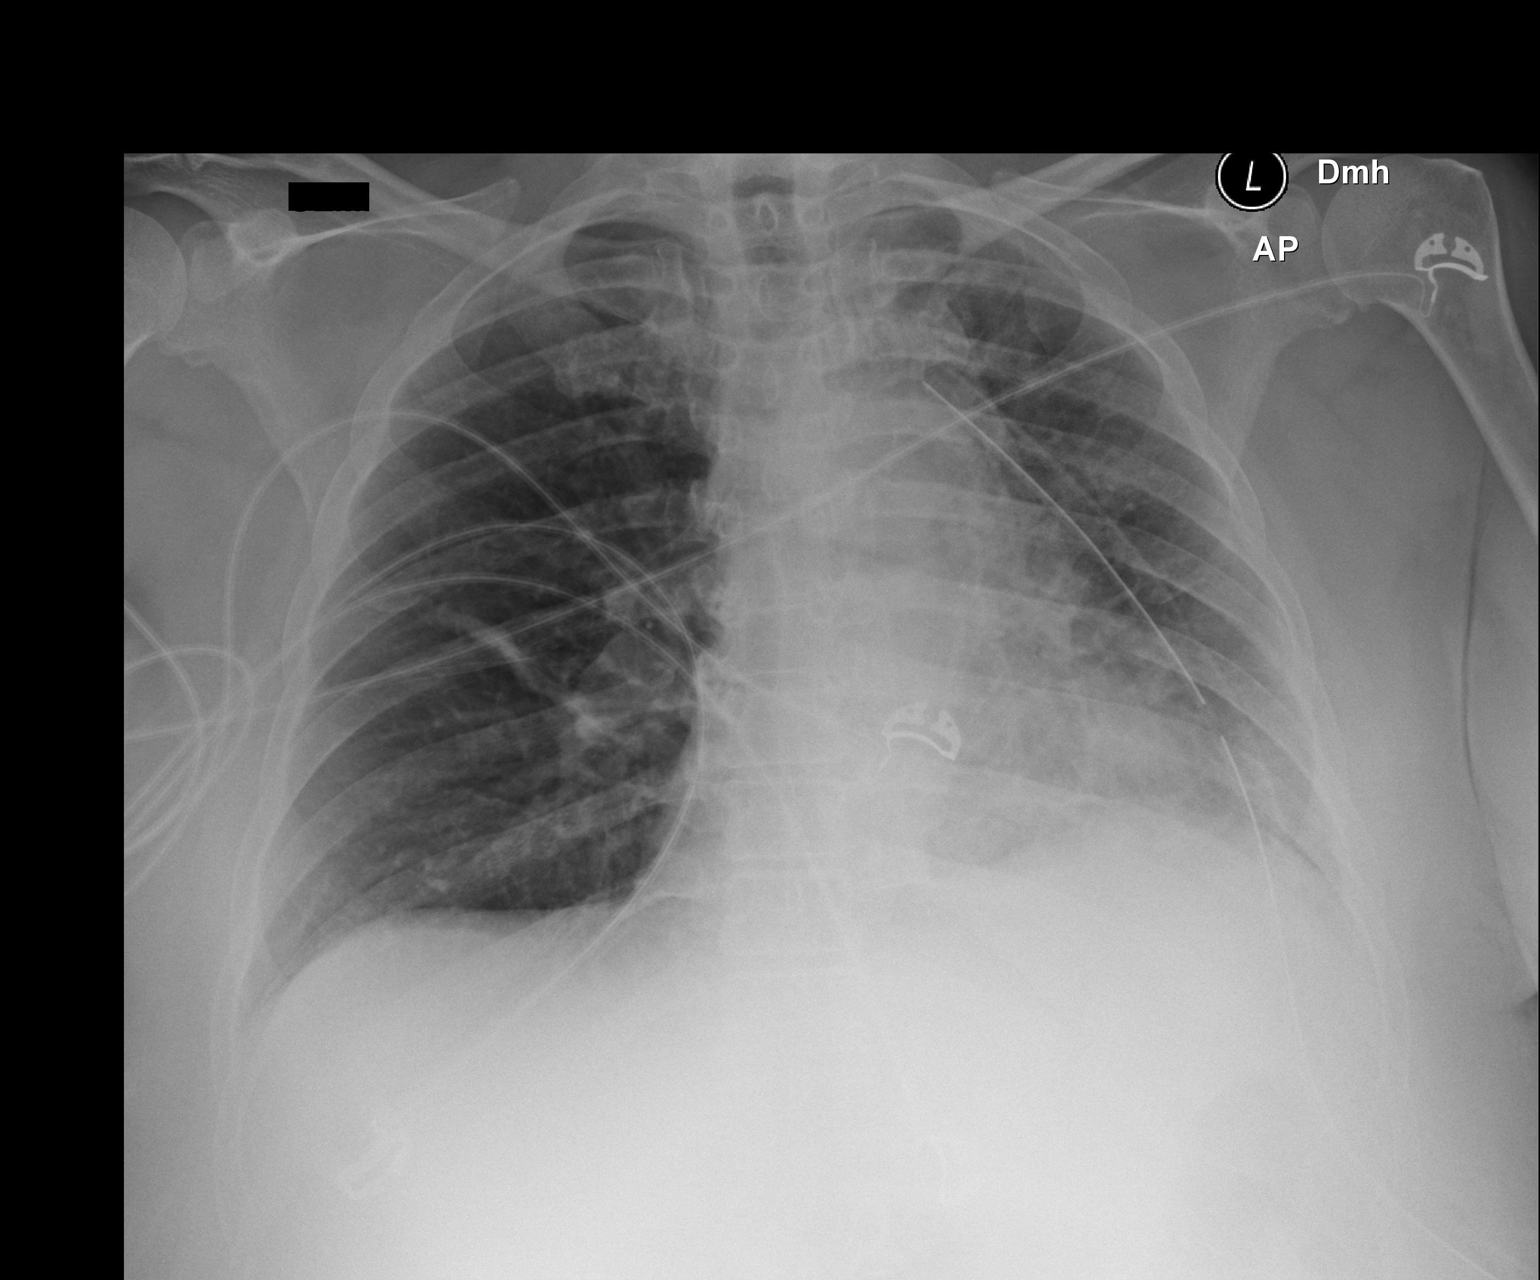

[1 of 1 positions shown; findings below may reference images not displayed]

FINDINGS: Cardiomediastinal silhouette unchanged, with left heart border
partially obscured by overlying lung/ pleural disease.

Similar appearance of mild airspace/interstitial opacity at the left
base with partial obscuration of left hemidiaphragm. No visualized
pneumothorax on the current study.

Linear opacities of the right lung, likely representing atelectasis.

Unchanged left-sided thoracostomy tube.
IMPRESSION: Unchanged left-sided thoracostomy tube, with no pneumothorax
visualized on the current study.

Mild airspace/ interstitial opacities at the left lung base, likely
combination of postoperative changes and atelectasis/ consolidation.

## 2019-09-11 IMAGING — DX DG CHEST 2V
2 series · 2 of 2 positions shown · non-contrast
Comparison: 02/27/2017, 02/26/2017 and earlier.

CLINICAL DATA: Postop day 4 left upper lobectomy. Follow-up left
pneumothorax.

EXAM:
CHEST  2 VIEW

[chest pa]
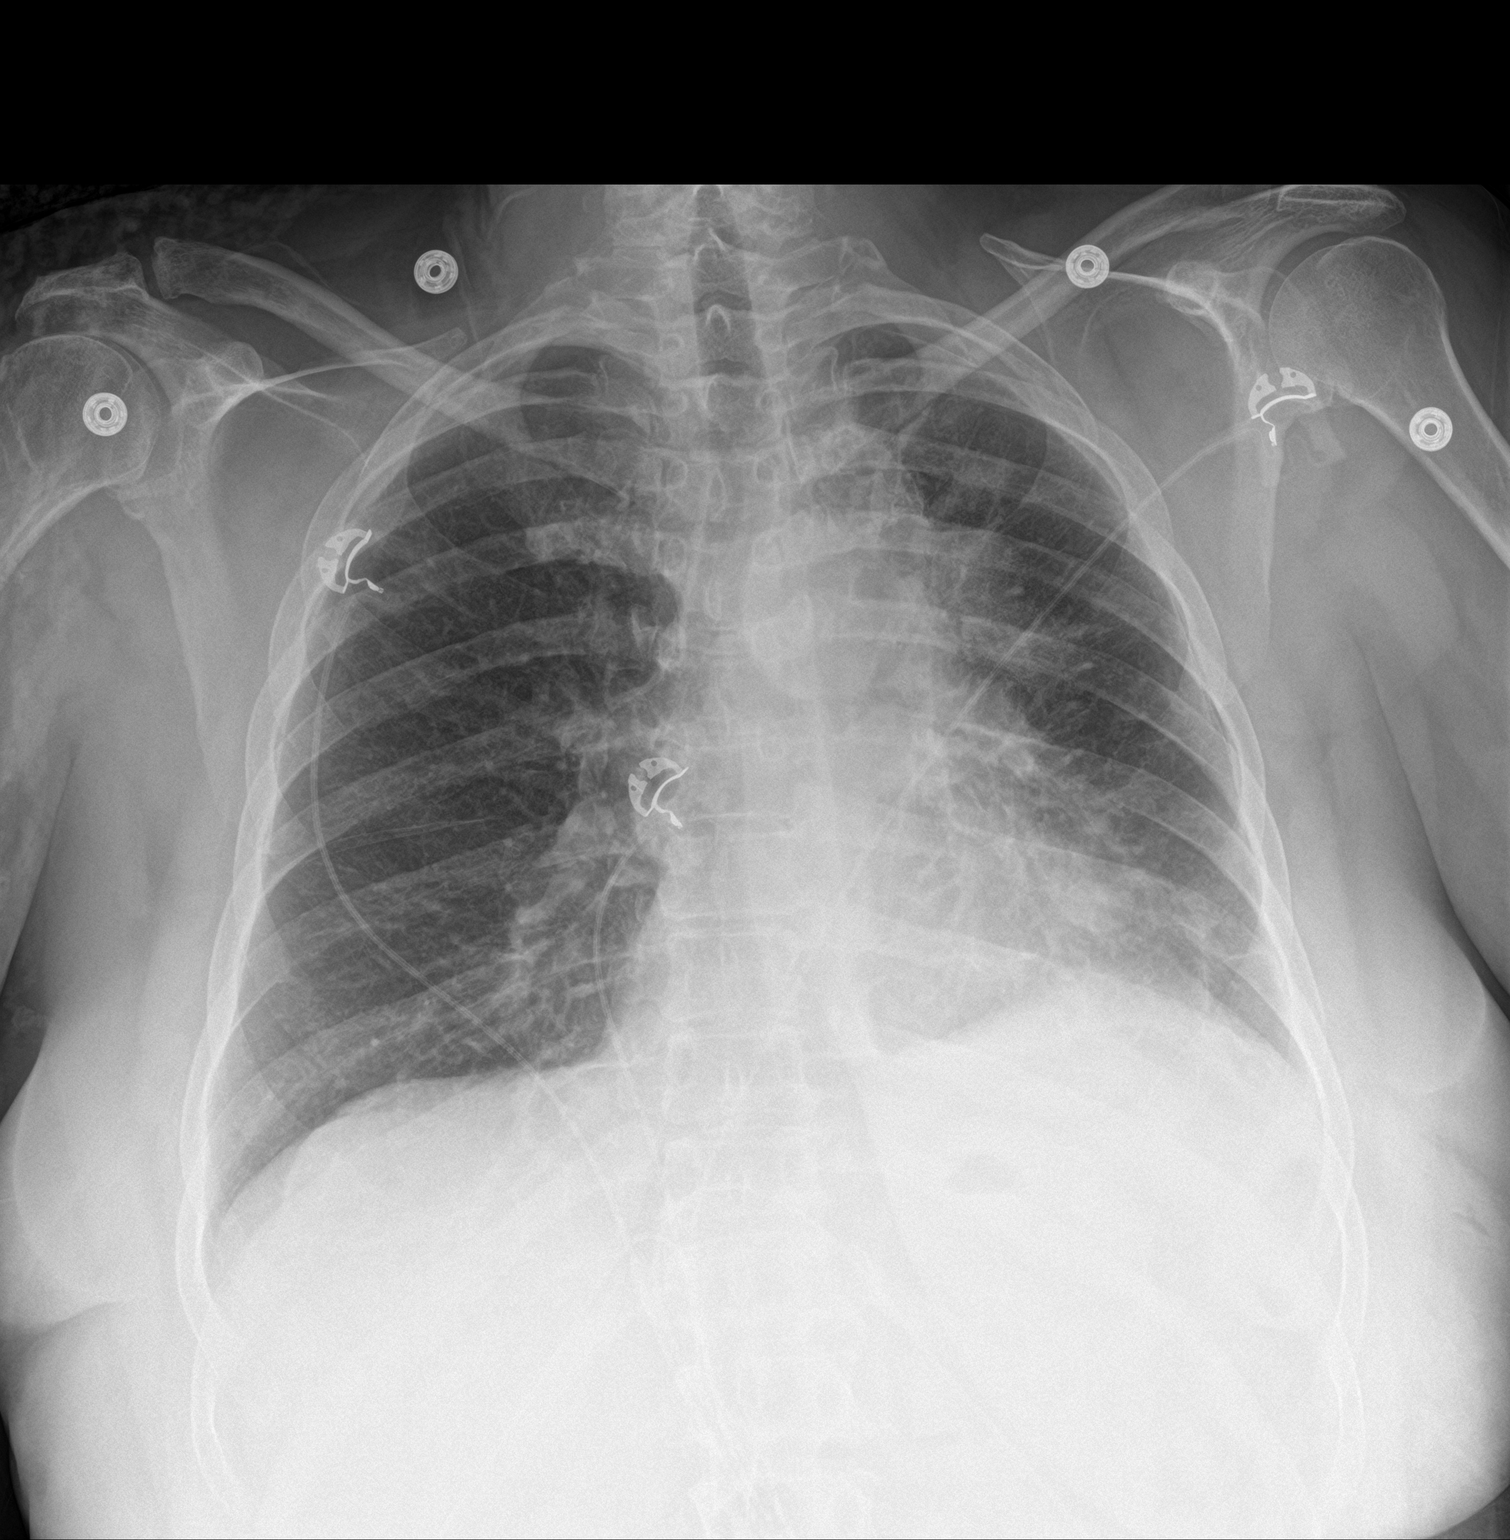

[chest lat]
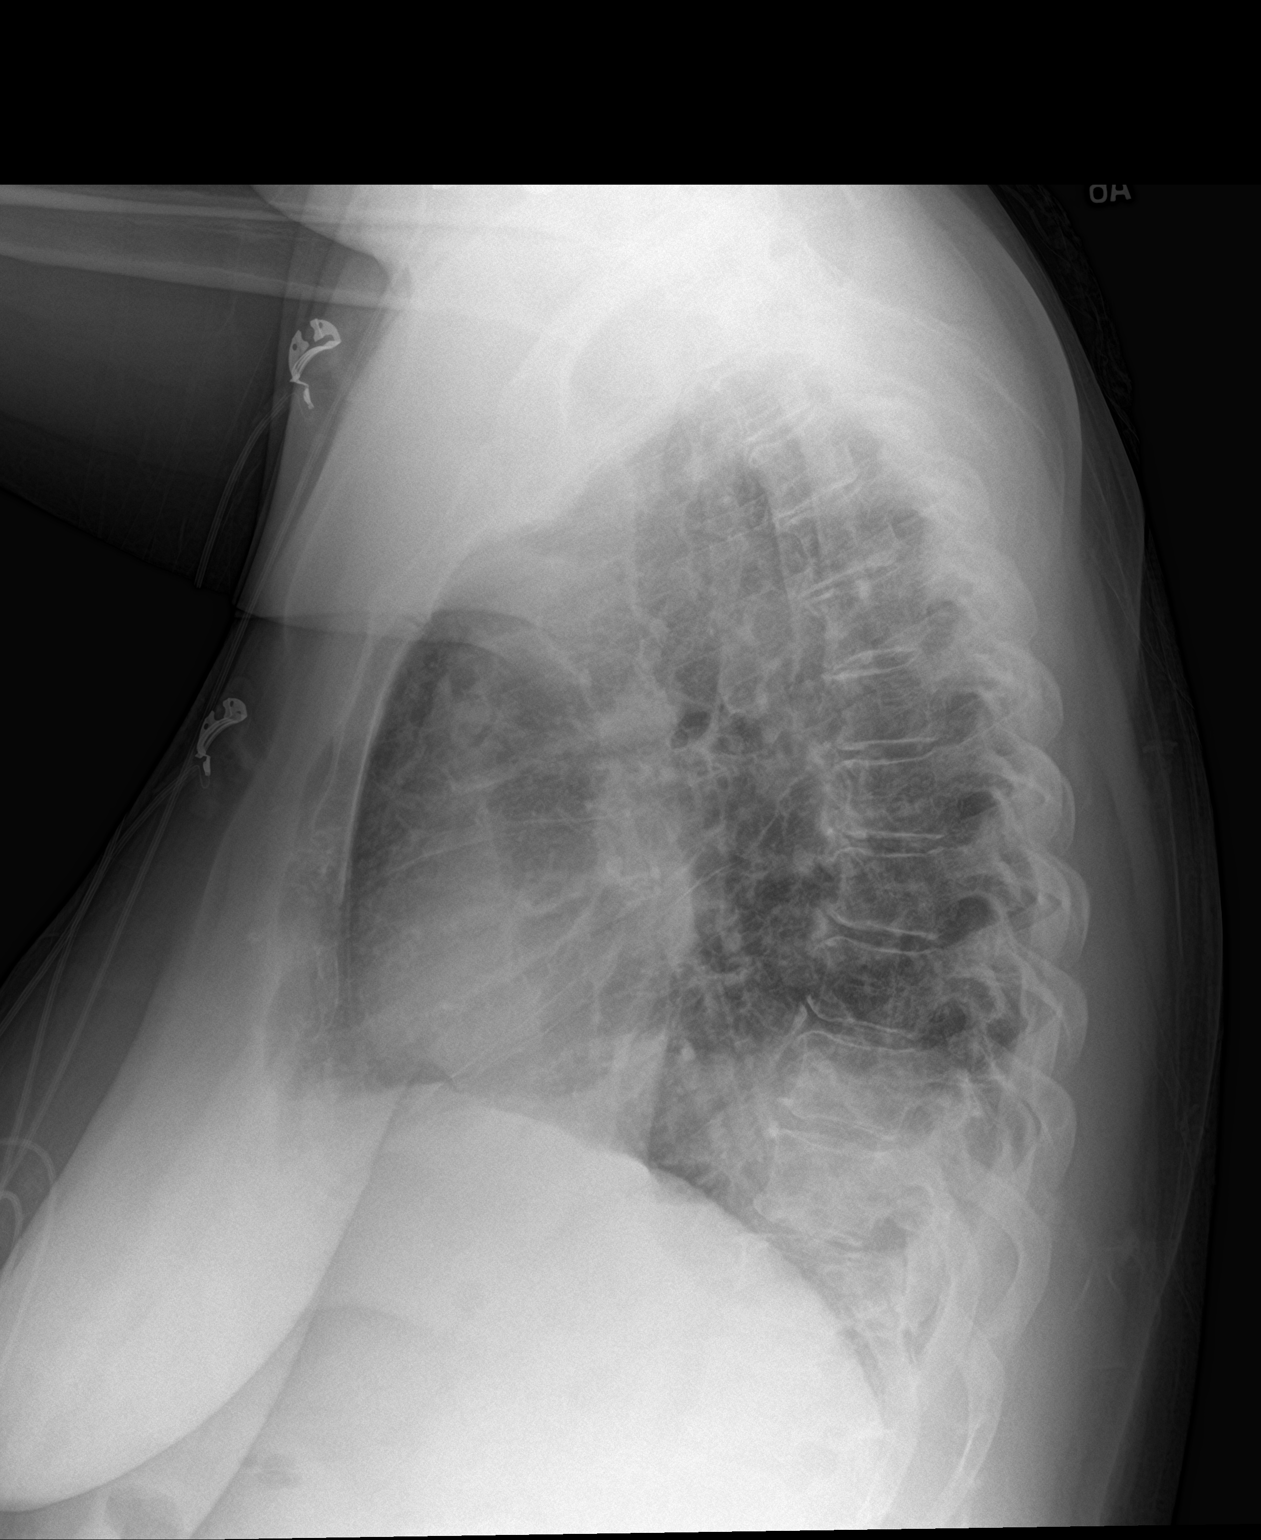

[2 of 2 positions shown; findings below may reference images not displayed]

FINDINGS: Small (5% or so) left apical and lateral pneumothorax, unchanged
since yesterday. Atelectasis in the left lung base with slight
improved aeration since yesterday. Lungs otherwise clear. Cardiac
silhouette normal in size, unchanged. Pulmonary vascularity normal.
IMPRESSION: 1. Stable small (5% or so) left apical and lateral pneumothorax.
2. Improved aeration of the left lung base with mild atelectasis
persisting.
3. No new abnormalities.

## 2019-09-25 IMAGING — DX DG CHEST 2V
2 series · 2 of 2 positions shown · non-contrast
Comparison: 02/28/2017, PET-CT 01/06/2017

CLINICAL DATA: Status post lobectomy, left lobectomy 02/24/2017

EXAM:
CHEST  2 VIEW

[dg chest 2 view (1 of 2)]
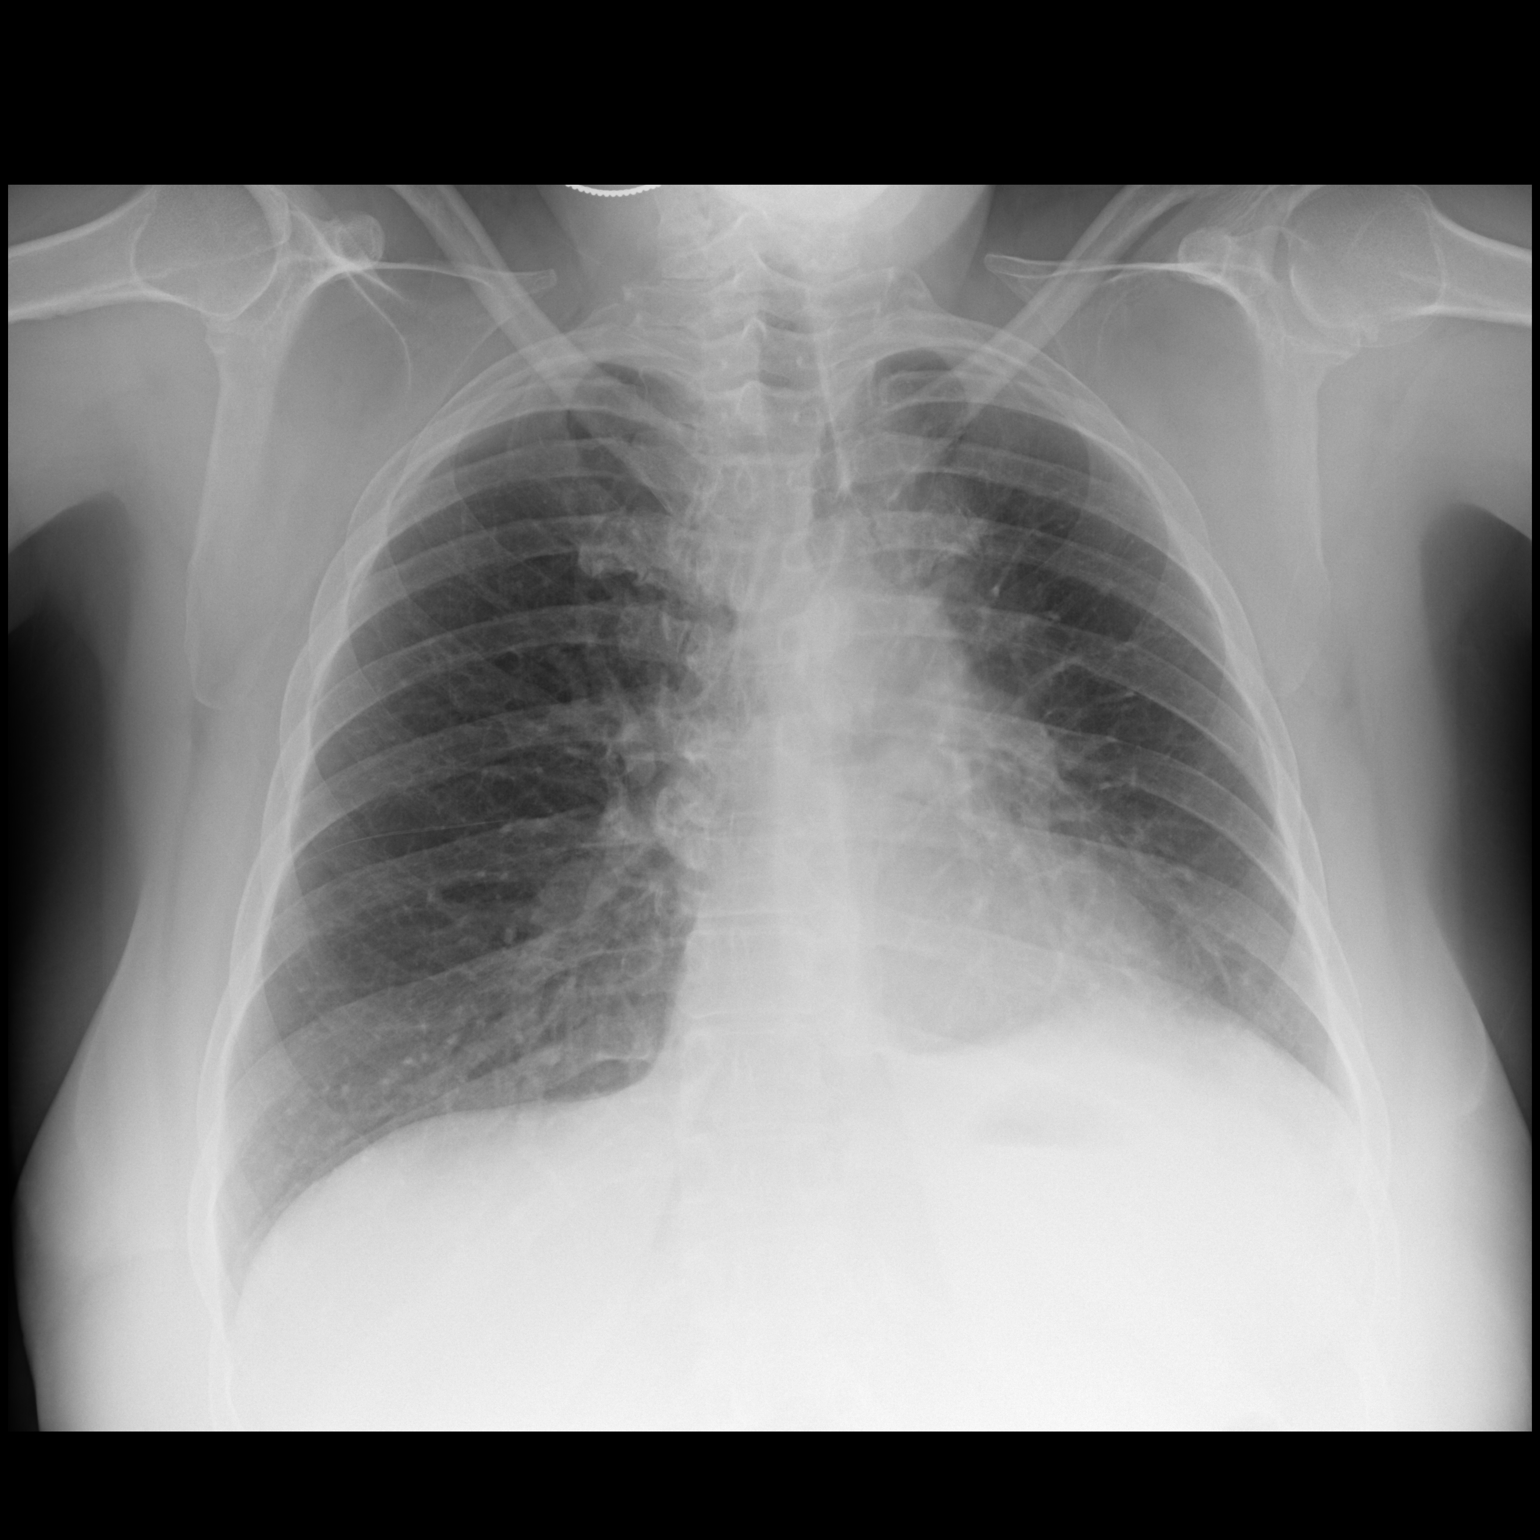

[dg chest 2 view (2 of 2)]
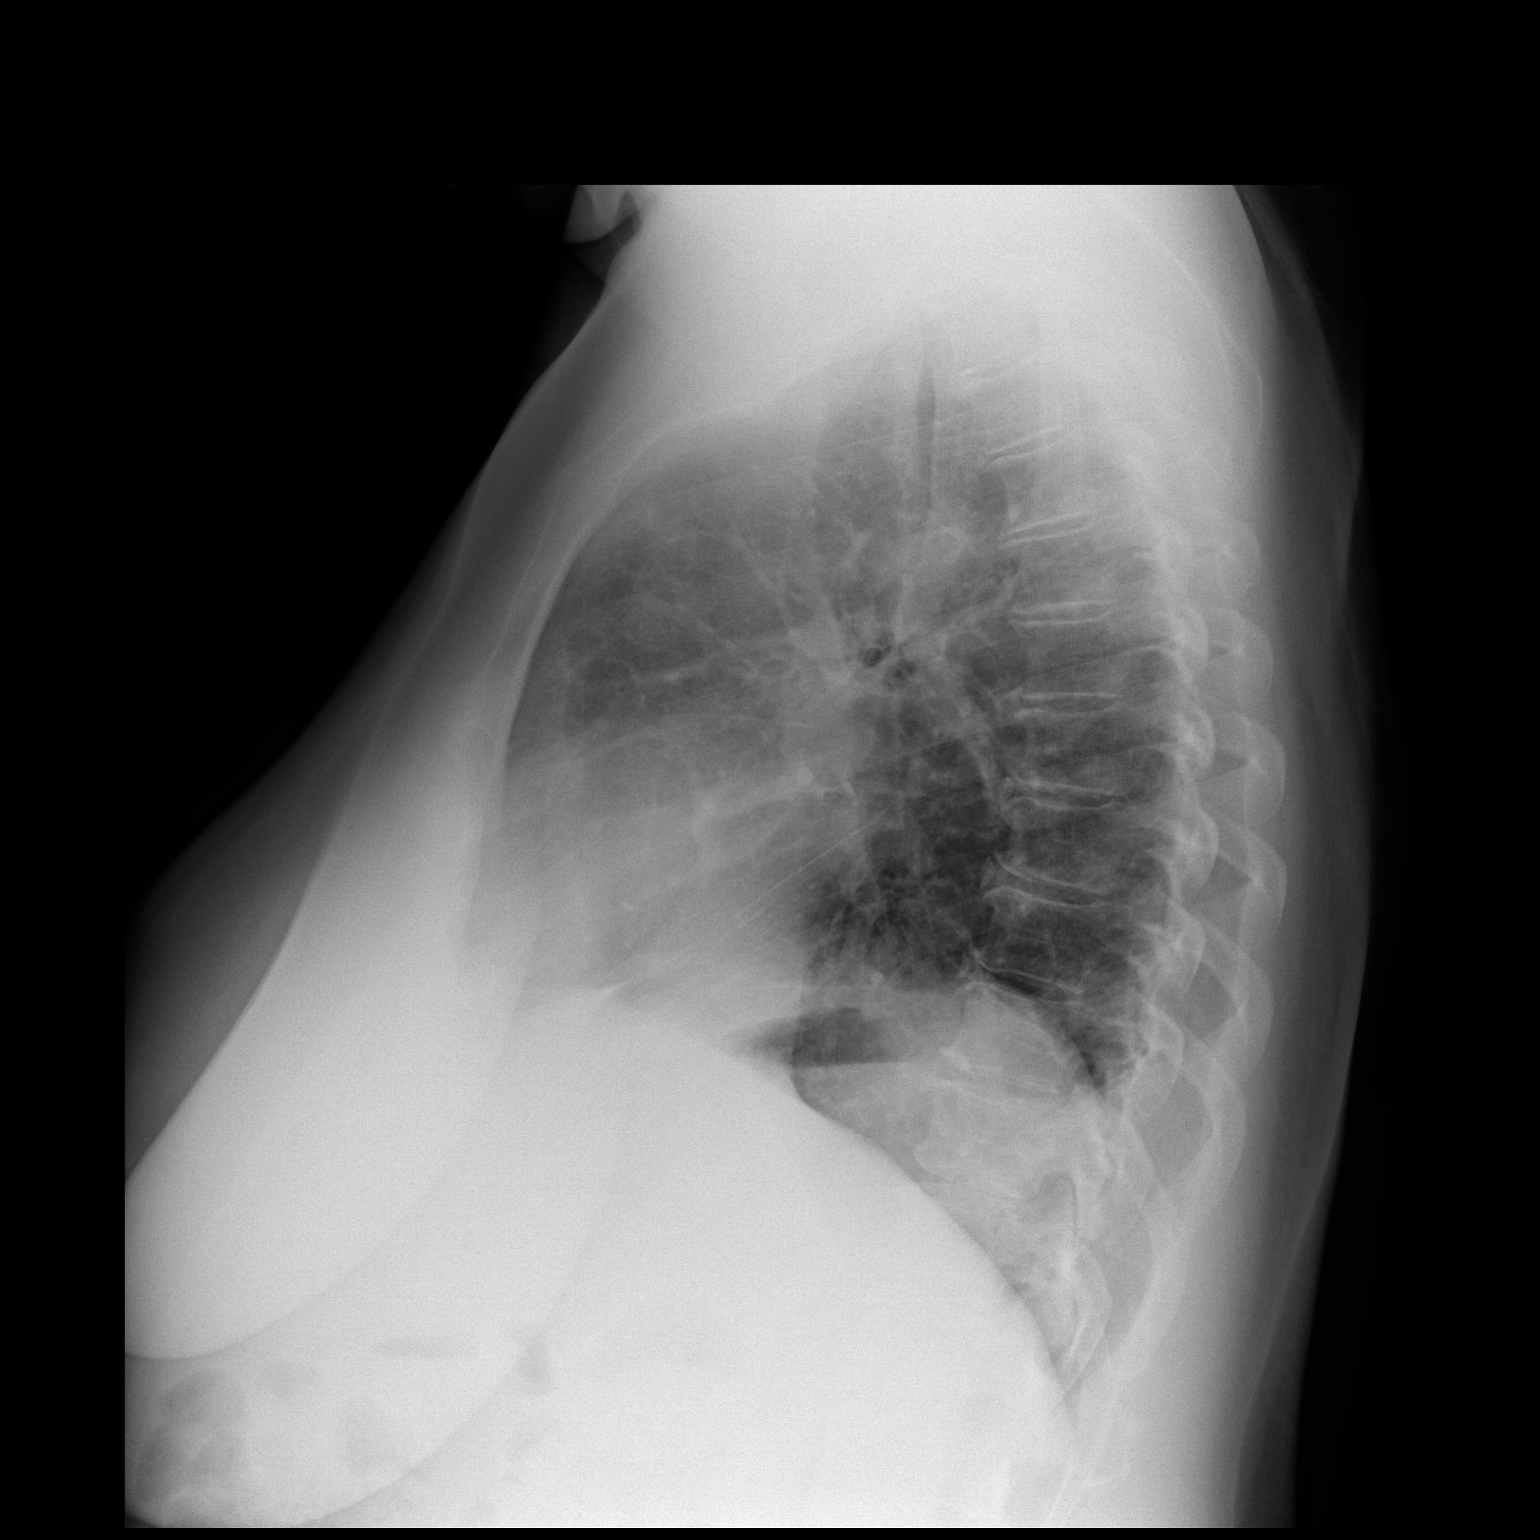

[2 of 2 positions shown; findings below may reference images not displayed]

FINDINGS: The right lung is clear. Resolution of previously noted small left
pneumothorax. Left lung base atelectasis with slight elevation of
the left diaphragm. Mild volume loss on the left consistent with
history of lobectomy. Normal heart size.
IMPRESSION: 1. Resolution of previously noted small left pneumothorax.
2. Postsurgical changes on the left. Mild residual atelectasis at
the left lung base.

## 2019-10-26 ENCOUNTER — Ambulatory Visit (INDEPENDENT_AMBULATORY_CARE_PROVIDER_SITE_OTHER): Payer: Federal, State, Local not specified - PPO

## 2019-10-26 ENCOUNTER — Other Ambulatory Visit: Payer: Self-pay

## 2019-10-26 ENCOUNTER — Ambulatory Visit (HOSPITAL_COMMUNITY)
Admission: EM | Admit: 2019-10-26 | Discharge: 2019-10-26 | Disposition: A | Discharge status: Home / Self Care | Payer: Federal, State, Local not specified - PPO

## 2019-10-26 ENCOUNTER — Encounter (HOSPITAL_COMMUNITY): Payer: Self-pay | Admitting: *Deleted

## 2019-10-26 DIAGNOSIS — F17200 Nicotine dependence, unspecified, uncomplicated: Secondary | ICD-10-CM

## 2019-10-26 DIAGNOSIS — Z902 Acquired absence of lung [part of]: Secondary | ICD-10-CM

## 2019-10-26 DIAGNOSIS — R0981 Nasal congestion: Secondary | ICD-10-CM | POA: Diagnosis not present

## 2019-10-26 DIAGNOSIS — R059 Cough, unspecified: Secondary | ICD-10-CM

## 2019-10-26 DIAGNOSIS — Z85118 Personal history of other malignant neoplasm of bronchus and lung: Secondary | ICD-10-CM

## 2019-10-26 DIAGNOSIS — R05 Cough: Secondary | ICD-10-CM

## 2019-10-26 DIAGNOSIS — J0100 Acute maxillary sinusitis, unspecified: Secondary | ICD-10-CM

## 2019-10-26 DIAGNOSIS — E119 Type 2 diabetes mellitus without complications: Secondary | ICD-10-CM

## 2019-10-26 DIAGNOSIS — J3489 Other specified disorders of nose and nasal sinuses: Secondary | ICD-10-CM

## 2019-10-26 MED ORDER — CETIRIZINE HCL 10 MG PO TABS: 10.0000 mg | ORAL_TABLET | Freq: Every day | ORAL | 0 refills | Status: DC

## 2019-10-26 MED ORDER — AMOXICILLIN-POT CLAVULANATE 875-125 MG PO TABS: 1.0000 | ORAL_TABLET | Freq: Two times a day (BID) | ORAL | 0 refills | Status: DC

## 2019-10-26 MED ORDER — PROMETHAZINE-DM 6.25-15 MG/5ML PO SYRP: 5.0000 mL | ORAL_SOLUTION | Freq: Every evening | ORAL | 0 refills | Status: DC | PRN

## 2019-10-26 MED ORDER — BENZONATATE 100 MG PO CAPS: 100.0000 mg | ORAL_CAPSULE | Freq: Three times a day (TID) | ORAL | 0 refills | Status: DC | PRN

## 2019-10-26 NOTE — ED Triage Notes (Addendum)
C/O runny nose, sinus pressure, nasal congestion, cough x 9 days without fever.  Has been taking OTC meds without relief. States completed 2nd Covid vaccine approx 3 wks ago.  Declines Covid test at this time.

## 2019-10-26 NOTE — ED Provider Notes (Signed)
[place]   MRN: [id number] DOB: [birth year]  Subjective:   Susan Farley is a 59 y.o. female presenting for 10-day history of persistent sinus congestion, sinus pressure, dry hacking cough.  Patient has a history of breast cancer, had bilateral mastectomy and completed treatment about a year ago.  She is also a smoker, smokes about 1 pack/day.  She does have well-controlled diabetes, last blood sugar was 117 yesterday.  Denies history of heart failure, heart disease.  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 Has had COVID vaccination, completed ~3 weeks ago.   No current facility-administered medications for this encounter.  Current Outpatient Medications:  .  anastrozole (ARIMIDEX) 1 MG tablet, , Disp: , Rfl:  .  aspirin EC 325 MG EC tablet, Take 1 tablet (325 mg total) by mouth daily., Disp: 30 tablet, Rfl: 0 .  [employer] FIBER GUMMIES PO, Take 2 capsules by mouth daily., Disp: , Rfl:  .  GLIPIZIDE PO, Take by mouth., Disp: , Rfl:  .  hydrochlorothiazide (HYDRODIURIL) 25 MG tablet, Take 25 mg by mouth daily., Disp: , Rfl:  .  metFORMIN (GLUCOPHAGE) 1000 MG tablet, , Disp: , Rfl:  .  pantoprazole (PROTONIX) 40 MG tablet, Take 80 mg by mouth daily. , Disp: , Rfl:  .  potassium chloride SA (K-DUR,KLOR-CON) 20 MEQ tablet, Take 1 tablet (20 mEq total) by mouth daily., Disp: 30 tablet, Rfl: 3 .  QUEtiapine (SEROQUEL) 25 MG tablet, Take 25 mg by mouth at bedtime., Disp: , Rfl:  .  rosuvastatin (CRESTOR) 20 MG tablet, Take 20 mg by mouth every evening., Disp: , Rfl:  .  timolol (TIMOPTIC) 0.5 % ophthalmic solution, AS DIRECTED INSTILL 1 DROP IN BOTH EYES TWICE A DAY, Disp: , Rfl: 2 .  venlafaxine XR (EFFEXOR-XR) 37.5 MG 24 hr capsule, Take 187.5 mg by mouth at bedtime., Disp: , Rfl:  .  acetaminophen (TYLENOL) 500 MG tablet, Take 2 tablets (1,000 mg total) by mouth every 6 (six) hours as needed., Disp: 30 tablet, Rfl:  0 .  amLODipine (NORVASC) 10 MG tablet, Take 1 tablet (10 mg total) by mouth daily., Disp: 30 tablet, Rfl: 3 .  dexamethasone (OZURDEX) 0.7 MG IMPL, 0.7 mg by Intravitreal route every 3 (three) months. , Disp: , Rfl:  .  docusate sodium (COLACE) 100 MG capsule, Take 200 mg by mouth at bedtime. , Disp: , Rfl:  .  dorzolamide (TRUSOPT) 2 % ophthalmic solution, Place 1 drop into both eyes 2 (two) times daily., Disp: , Rfl:  .  EPINEPHrine 0.3 mg/0.3 mL IJ SOAJ injection, , Disp: , Rfl:  .  fluticasone (FLONASE) 50 MCG/ACT nasal spray, , Disp: , Rfl:  .  HYDROcodone-acetaminophen (NORCO/VICODIN) 5-325 MG tablet, Take 1 tablet by mouth every 6 (six) hours as needed., Disp: 30 tablet, Rfl: 0 .  HYDROmorphone (DILAUDID) 2 MG tablet, Take 1-2 tablets (2-4 mg total) by mouth every 4 (four) hours as needed for severe pain., Disp: 30 tablet, Rfl: 0 .  lisinopril (PRINIVIL,ZESTRIL) 5 MG tablet, Take 1 tablet (5 mg total) by mouth daily., Disp: 30 tablet, Rfl: 3 .  metFORMIN (GLUCOPHAGE) 500 MG tablet, Take 500 mg by mouth 2 (two) times daily with a meal., Disp: , Rfl:  .  nystatin (MYCOSTATIN) 100000 UNIT/ML suspension, , Disp: , Rfl:  .  ondansetron (ZOFRAN) 8 MG tablet, , Disp: , Rfl:  .  oxyCODONE (OXY IR/ROXICODONE) 5 MG  immediate release tablet, , Disp: , Rfl:  .  timolol (BETIMOL) 0.5 % ophthalmic solution, Place 1 drop into both eyes 2 (two) times daily., Disp: , Rfl:    Allergies  Allergen Reactions  . Shellfish Allergy Anaphylaxis    Past Medical History:  Diagnosis Date  . Asthma   . Colitis   . Depression   . Diabetes mellitus without complication ([place])   . GERD (gastroesophageal reflux disease)   . Heart murmur   . Hypercholesteremia   . Hypertension   . Retinitis pigmentosa   . Sleep apnea    doesnt wear BIPAP currently  . Stroke [facility]) Nov. 2015   no residual     Past Surgical History:  Procedure Laterality Date  . BONE GRAFT HIP ILIAC CREST    . CATARACT EXTRACTION    .  IUD REMOVAL    . LOBECTOMY Left 02/24/2017   Procedure: Left upper lobe LOBECTOMY;  Surgeon: Melrose Nakayama, MD;  Location: [facility];  Service: Thoracic;  Laterality: Left;  . NODE DISSECTION Left 02/24/2017   Procedure: NODE DISSECTION;  Surgeon: Melrose Nakayama, MD;  Location: [facility];  Service: Thoracic;  Laterality: Left;  . OOPHORECTOMY Right 2004   removal of cyst and ovary  . RETINAL CRYOABLATION Bilateral    patient unsure of whether she had this  . VIDEO ASSISTED THORACOSCOPY (VATS)/WEDGE RESECTION Left 02/24/2017   Procedure: Left VIDEO ASSISTED THORACOSCOPY (VATS)/WEDGE RESECTION;  Surgeon: Melrose Nakayama, MD;  Location: [facility] OR;  Service: Thoracic;  Laterality: Left;    Family History  Adopted: Yes  Problem Relation Age of Onset  . Diabetes Other        BIOLOGICAL AUNT  . Heart disease Other        BIOLOGICAL AUNT    Social History   Tobacco Use  . Smoking status: Current Every Day Smoker    Packs/day: 1.00    Years: 40.00    Pack years: 40.00    Types: Cigarettes    Last attempt to quit: 02/28/2017    Years since quitting: 2.6  . Smokeless tobacco: Never Used  . Tobacco comment: 1 1/2 pk per day plus vaporized cigarette.   Substance Use Topics  . Alcohol use: Not Currently  . Drug use: No    ROS   Objective:   Vitals: BP 107/65   Pulse 90   Temp 98.2 F (36.8 C) (Oral)   Resp 18   LMP 11/25/2011   SpO2 96%   Physical Exam Constitutional:      General: She is not in acute distress.    Appearance: Normal appearance. She is well-developed. She is obese. She is not ill-appearing, toxic-appearing or diaphoretic.  HENT:     Head: Normocephalic and atraumatic.     Nose: Congestion present.     Comments: Nasal mucosa erythematous and boggy.  Bilateral maxillary sinus tenderness.    Mouth/Throat:     Mouth: Mucous membranes are moist.     Comments: Postnasal drainage overlying pharynx. Eyes:     General: No scleral icterus.       Right eye:  No discharge.        Left eye: No discharge.     Extraocular Movements: Extraocular movements intact.     Pupils: Pupils are equal, round, and reactive to light.  Cardiovascular:     Rate and Rhythm: Normal rate and regular rhythm.     Pulses: Normal pulses.     Heart sounds: Normal  heart sounds. No murmur. No friction rub. No gallop.   Pulmonary:     Effort: Pulmonary effort is normal. No respiratory distress.     Breath sounds: No stridor. Rales (left mid field) present. No wheezing or rhonchi.  Skin:    General: Skin is warm and dry.     Findings: No rash.  Neurological:     Mental Status: She is alert and oriented to person, place, and time.  Psychiatric:        Mood and Affect: Mood normal.        Behavior: Behavior normal.        Thought Content: Thought content normal.        Judgment: Judgment normal.    DG Chest 2 View  Result Date: 10/26/2019 CLINICAL DATA:  Cough.  Rales EXAM: CHEST - 2 VIEW COMPARISON:  05/16/2017 FINDINGS: Postoperative left lung with scarring overlapping the heart that was also seen on 2019 chest CT. There is no edema, consolidation, effusion, or pneumothorax. Normal heart size and mediastinal contours. IMPRESSION: No active cardiopulmonary disease. Electronically Signed   By: Monte Fantasia M.D.   On: 10/26/2019 15:56    Assessment and Plan :   PDMP not reviewed this encounter.  1. Nasal congestion   2. Cough   3. Tenderness over maxillary sinus   4. Tobacco use disorder   5. Well controlled diabetes mellitus ([place])   6. History of lung cancer   7. History of lobectomy of lung   8. Acute non-recurrent maxillary sinusitis     Start Augmentin to cover for maxillary sinusitis.  Use cough suppression medications, start Zyrtec especially in light of her smoking.  Will defer COVID-19 testing given reassuring chest x-ray and completion of her vaccination. Counseled patient on potential for adverse effects with medications prescribed/recommended today,  ER and return-to-clinic precautions discussed, patient verbalized understanding.    Jaynee Eagles, PA-C 10/26/19 1622

## 2020-12-21 ENCOUNTER — Encounter (HOSPITAL_COMMUNITY): Payer: Self-pay

## 2020-12-21 ENCOUNTER — Other Ambulatory Visit: Payer: Self-pay

## 2020-12-21 ENCOUNTER — Ambulatory Visit (HOSPITAL_COMMUNITY)
Admission: EM | Admit: 2020-12-21 | Discharge: 2020-12-21 | Disposition: A | Discharge status: Home / Self Care | Payer: Non-veteran care | Attending: Family Medicine | Admitting: Family Medicine

## 2020-12-21 DIAGNOSIS — R519 Headache, unspecified: Secondary | ICD-10-CM | POA: Diagnosis not present

## 2020-12-21 DIAGNOSIS — J0100 Acute maxillary sinusitis, unspecified: Secondary | ICD-10-CM

## 2020-12-21 MED ORDER — AMOXICILLIN 875 MG PO TABS: 875.0000 mg | ORAL_TABLET | Freq: Two times a day (BID) | ORAL | 0 refills | Status: AC

## 2020-12-21 MED ORDER — KETOROLAC TROMETHAMINE 30 MG/ML IJ SOLN
INTRAMUSCULAR | Status: AC
  Filled 2020-12-21: qty 1

## 2020-12-21 MED ORDER — KETOROLAC TROMETHAMINE 30 MG/ML IJ SOLN
30.0000 mg | Freq: Once | INTRAMUSCULAR | Status: AC
  Administered 2020-12-21: 30 mg via INTRAMUSCULAR

## 2020-12-21 NOTE — ED Provider Notes (Signed)
[place]   [id number] 12/21/20 Arrival Time: 7829  ASSESSMENT & PLAN:  1. Acute nonintractable headache, unspecified headache type   2. Acute non-recurrent maxillary sinusitis    Normal neurological exam.  Begin: Meds ordered this encounter  Medications   amoxicillin (AMOXIL) 875 MG tablet    Sig: Take 1 tablet (875 mg total) by mouth 2 (two) times daily for 10 days.    Dispense:  20 tablet    Refill:  0   ketorolac (TORADOL) 30 MG/ML injection 30 mg   OTC symptom care as needed. Ensure adequate fluid intake and rest.   Follow-up Information     [facility].   Specialty: Urgent Care Why: If worsening or failing to improve as anticipated. Contact information: [place] [phone]                Reviewed expectations re: course of current medical issues. Questions answered. Outlined signs and symptoms indicating need for more acute intervention. Patient verbalized understanding. After Visit Summary given.   SUBJECTIVE: History from: patient.  Susan Farley is a 60 y.o. female who presents with complaint of nasal congestion, post-nasal drainage, and sinus pain/headache; mostly R sided over cheek and behind eye. No visual changes. Onset gradual, a week ago. Respiratory symptoms: none. Fever: absent. Overall normal PO intake without n/v. OTC treatment: none. Seasonal allergies: yes; "constant". History of frequent sinus infections: no. No specific aggravating or alleviating factors reported.  Social History   Tobacco Use  Smoking Status Every Day   Packs/day: 1.00   Years: 40.00   Pack years: 40.00   Types: Cigarettes   Last attempt to quit: 02/28/2017   Years since quitting: 3.8  Smokeless Tobacco Never  Tobacco Comments   1 1/2 pk per day plus vaporized cigarette.     OBJECTIVE:  Vitals:   12/21/20 1026  BP: 116/75  Pulse: 78  Resp: 18  Temp: 98.2 F (36.8 C)   TempSrc: Oral  SpO2: 98%     General appearance: alert; no distress HEENT: nasal congestion; clear runny nose; throat irritation secondary to post-nasal drainage; R-sided maxillary tenderness to palpation; turbinates boggy; PERRLA; EOMI without pain; no periorbital swelling or erythema Neck: supple without LAD; trachea midline Lungs: unlabored respirations, symmetrical air entry; cough: absent; no respiratory distress Skin: warm and dry Psychological: alert and cooperative; normal mood and affect  Allergies  Allergen Reactions   Shellfish Allergy Anaphylaxis    Past Medical History:  Diagnosis Date   Asthma    Colitis    Depression    Diabetes mellitus without complication (HCC)    GERD (gastroesophageal reflux disease)    Heart murmur    Hypercholesteremia    Hypertension    Retinitis pigmentosa    Sleep apnea    doesnt wear BIPAP currently   Stroke [facility]) Nov. 2015   no residual   Family History  Adopted: Yes  Problem Relation Age of Onset   Diabetes Other        BIOLOGICAL AUNT   Heart disease Other        BIOLOGICAL AUNT   Social History   Socioeconomic History   Marital status: Single    Spouse name: Not on file   Number of children: 0   Years of education: 14    Highest education level: Not on file  Occupational History   Occupation: Retired     Fish farm manager: [employer]  Tobacco Use   Smoking status: Every Day    Packs/day: 1.00    Years: 40.00    Pack years: 40.00    Types: Cigarettes    Last attempt to quit: 02/28/2017    Years since quitting: 3.8   Smokeless tobacco: Never   Tobacco comments:    1 1/2 pk per day plus vaporized cigarette.   Vaping Use   Vaping Use: Former  Substance and Sexual Activity   Alcohol use: Not Currently   Drug use: No   Sexual activity: Not on file  Other Topics Concern   Not on file  Social History Narrative   Patient is single with no children.   Patient is right handed.   Patient has 14 yrs of education.    Patient drinks 2 cups daily.         Epworth Sleepiness Scale Score: 9      --I have HTN   --I have had a stroke   --I seem to be losing my sex drive   --I feel stressed and lack motivation   --I have been told that I stop breathing during sleep   --I wake up to urinate frequently at night   --I wake up with a dry mouth or sore throat   --I feel excessively sleepy and tired during the day   --I have Diabetes   --I have been told that I snore   --I am overweight or am gaining weight   --I wake up gasping for breath during the night   --I have dozed off or fallen asleep at inappropriate times during the day   Social Determinants of Health   Financial Resource Strain: Not on file  Food Insecurity: Not on file  Transportation Needs: Not on file  Physical Activity: Not on file  Stress: Not on file  Social Connections: Not on file  Intimate Partner Violence: Not on file             Vanessa Kick, MD 12/21/20 1046

## 2020-12-21 NOTE — ED Triage Notes (Signed)
Pt presents with recurrent headache X 1 week that makes her eyes sensitivity.

## 2020-12-21 NOTE — Discharge Instructions (Addendum)
Meds ordered this encounter  Medications   amoxicillin (AMOXIL) 875 MG tablet    Sig: Take 1 tablet (875 mg total) by mouth 2 (two) times daily for 10 days.    Dispense:  20 tablet    Refill:  0   ketorolac (TORADOL) 30 MG/ML injection 30 mg

## 2021-07-07 ENCOUNTER — Encounter: Payer: Self-pay | Admitting: Neurology

## 2021-07-07 ENCOUNTER — Institutional Professional Consult (permissible substitution): Payer: Federal, State, Local not specified - PPO | Admitting: Neurology

## 2021-07-07 ENCOUNTER — Ambulatory Visit (INDEPENDENT_AMBULATORY_CARE_PROVIDER_SITE_OTHER): Payer: No Typology Code available for payment source | Admitting: Neurology

## 2021-07-07 VITALS — BP 116/74 | HR 83 | Ht 64.0 in | Wt 203.5 lb

## 2021-07-07 DIAGNOSIS — F17218 Nicotine dependence, cigarettes, with other nicotine-induced disorders: Secondary | ICD-10-CM

## 2021-07-07 DIAGNOSIS — I739 Peripheral vascular disease, unspecified: Secondary | ICD-10-CM

## 2021-07-07 DIAGNOSIS — G4733 Obstructive sleep apnea (adult) (pediatric): Secondary | ICD-10-CM | POA: Insufficient documentation

## 2021-07-07 DIAGNOSIS — E1159 Type 2 diabetes mellitus with other circulatory complications: Secondary | ICD-10-CM

## 2021-07-07 DIAGNOSIS — J322 Chronic ethmoidal sinusitis: Secondary | ICD-10-CM | POA: Diagnosis not present

## 2021-07-07 DIAGNOSIS — R0981 Nasal congestion: Secondary | ICD-10-CM

## 2021-07-07 DIAGNOSIS — C50411 Malignant neoplasm of upper-outer quadrant of right female breast: Secondary | ICD-10-CM

## 2021-07-07 DIAGNOSIS — I6309 Cerebral infarction due to thrombosis of other precerebral artery: Secondary | ICD-10-CM | POA: Diagnosis not present

## 2021-07-07 DIAGNOSIS — Z789 Other specified health status: Secondary | ICD-10-CM | POA: Insufficient documentation

## 2021-07-07 DIAGNOSIS — F17200 Nicotine dependence, unspecified, uncomplicated: Secondary | ICD-10-CM | POA: Insufficient documentation

## 2021-07-07 NOTE — Progress Notes (Signed)
SLEEP MEDICINE CLINIC    Provider:  Larey Seat, MD  Primary Care Physician:     Referring Provider: Dr Erlinda Hong, Neurologist, Stroke MD.   [facility], [address] [place],  [place] [address]          Chief Complaint according to patient   Patient presents with:     New Patient (Initial Visit)           HISTORY OF PRESENT ILLNESS:  Susan Farley is a 61 y.o. African American female patient seen here as a referral on 07/07/2021 from [place] for a Sleep Consultation. .  Chief concern according to patient :   Paper referral for OSA. Pt was diagnosed with OSA  about 6 years ago at the [place] clinic. 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   Pt was unable to use CPAP due to getting anxiety from wearing a mask. Pt reports snoring, daytime sleepiness, fatigue. Can fall asleep watching tv or listening to audio books.  Has been a snorer.     Susan Farley  has a past medical history of Asthma, Colitis, Depression, Diabetes mellitus without complication ([place]), GERD (gastroesophageal reflux disease), Heart murmur, Hypercholesteremia, Hypertension, Retinitis pigmentosa, Sleep apnea, and Stroke [facility]) (Nov. 2015)..05-08-2014: Stroke History Summary Susan Farley is an 61 y.o. female with a history of HTN, HLD, DM, smoker and retinitis pigmentosa was admitted to [facility] on 04/24/14 for 9 day hx of numbness involving the right side. Numbness involving face arm and leg. She had no weakness and no changes in speech or swallowing. 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 She is heavy smoker. 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 CTA showed 50% narrowing or left ICA no significant stenosis. She was discharged with ASA and zocor.    Interval History During the interval time, the patient has been doing the same. She still complains of left sided numbness and not much better. She went to see Dr. Donnetta Hutching on 05/06/14 and was consider medical  management of left ICA stenosis and repeat CUS in one year. She is still smoking. Her glucose at home around 190-200s.     [facility] with a result of an AHI ( Apnea Hypopnea index)  of severe,apnea but was unable to use CPAP. Has chronic nasal congestion, still smokes, sinusitis and claustrophobia.    Sleep relevant medical history: no sinus surgery, no Tonsillectomy ,deviated septum , unrepaired.  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 Breast cancer diagnosed in 2019- had mastectomies bilaterally. A now has ascites.     Family medical /sleep history: patient was adopted , found her biological family- nobody  has retinitis pigmentosa- no other family member on CPAP with OSA,.    Social history:  Patient is retired from Radio broadcast assistant and lives in a household alone with her dog. . Family status is single, without children, no grandchildren.  Tobacco use- 2 ppd- not interested in quitting .   ETOH use - none-, used to drink, quit 2018. Caffeine intake in form of Coffee( /) Soda( /) Tea ( 3-4 glasses ) or energy drinks. No Regular exercise .    Sleep habits are as follows: The patient's dinner time is between 5 PM. The patient goes to bed at 10-12 PM and continues to sleep for intervals of  2-3 hours  wakes for pet's bathroom breaks.  No witness to her sleep-  Bedroom is cool, quiet and dark.  The preferred sleep position is variable, mostly on her sides. , with the support of 1 pillow. Dreams are reportedly  frequent/vivid.  5.30-6  AM is the usual rise time. The patient wakes up spontaneously.   She reports sometimes feeling refreshed or restored in AM, with symptoms such as dry mouth, no morning headaches, and residual fatigue.  Naps are taken infrequently, lasting from 1 -3 PM .   Brief summary of referral notes-the patient is referred by the [facility] for sleep testing.  She has a loss of field of  vision that is 60 to 80% degree service-connected disability, weight at the last VA visit was 210 pounds and a BMI of 36.2.  Her VA physician noted that she was loud slowing she has never used an oral appliance before and when she was tested for sleep apnea apparently the test was strongly positive but is more than 3 years ago.  There is another concern that she seems to be claustrophobic or unable to tolerate CPAP because of her nasal patency.  The patient is chronically congested and reports that she cannot breathe through her nose she also has frequent sinusitis.  This may not be allergic or seasonal.  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  She developed hirsutism in relation to the antiestrogen medication she is now on.  She also reports that with the onset of this medication therapy she became excessively fatigued and drowsy.  Her sleep is just not as refreshing but it is not fragmented it is her dog who wakes her up at night.  She does go to the bathroom before she goes to bed usually and does not have to go up at night.  She is also on venlafaxine the generic form of Effexor. She is on 400 mg of Seroquel generic form she takes 1/2 tablet of the 400 mg tablet at bedtime, her medical assistant suspended but she is actively taking it, she is on metformin, she is using a sinus rinse, she is on venlafaxine 150 mg 24-hour extended release she takes 2 capsules in the morning.  She is on BuSpar 10 mg 2 capsules at bedtime, potassium supplement .   Anxiety provoking.  To be environment feeling strange history of ureter is.  Dental device for an is not able still cannot review is you being able takes loratadine for allergies and she has not noticed that this has affected her nasal patency well.     She has a history of septal deviation but it was never treated by an ENT and I wonder if that would be the first investigation to do in case her severity of apnea requires CPAP she will again not be able to tolerate it unless nasal patency and sinusitis are treated.  The patient is followed by behavioral sleep psychology over at the [place].  Alternatives to CPAP such as inspire would only be applied if the patient's BMI is under 32.  Her last sleep study showed an AHI of 34.6 all sleep lateral not all sleep non-REM.   Review of Systems: Out of a complete 14 system review, the patient complains of only the following symptoms, and all other  reviewed systems are negative.:  Fatigue, sleepiness , snoring, fragmented sleep, cyclic hypersomnia. Insomnia - "pet induced "   How likely are you to doze in the following situations: 0 = not likely, 1 = slight chance, 2 = moderate chance, 3 = high chance   Sitting and Reading? Watching Television? Sitting inactive in a public place (theater or meeting)? As a passenger in a car for an hour without a break? Lying down in the afternoon when circumstances permit? Sitting and talking to someone? Sitting quietly after lunch without alcohol? In a car, while stopped for a few minutes in traffic?   Total = 15/ 24 points   FSS endorsed at 49/ 63 points.   Nicotine addict.   Social History   Socioeconomic History   Marital status: Single    Spouse name: Not on file   Number of children: 0   Years of education: 14    Highest education level: Not on file  Occupational History   Occupation: Retired     Fish farm manager: [employer]  Tobacco Use   Smoking status: Every Day    Packs/day: 2.00    Years: 40.00    Pack years: 80.00    Types: Cigarettes    Last attempt to quit: 02/28/2017    Years since quitting: 4.3   Smokeless tobacco: Never   Tobacco comments:    2pk per day   Vaping Use   Vaping Use: Former  Substance and Sexual Activity   Alcohol use:  Not Currently   Drug use: No   Sexual activity: Not on file  Other Topics Concern   Not on file  Social History Narrative   Patient is single with no children.   Patient is right handed.   Patient has 14 yrs of education.   Patient drinks 2 cups daily.         Epworth Sleepiness Scale Score: 9      --I have HTN   --I have had a stroke   --I seem to be losing my sex drive   --I feel stressed and lack motivation   --I have been told that I stop breathing during sleep   --I wake up to urinate frequently at night   --I wake up with a dry mouth or sore throat   --I feel excessively sleepy and tired during the day   --I have Diabetes   --I have been told that I snore   --I am overweight or am gaining weight   --I wake up gasping for breath during the night   --I have dozed off or fallen asleep at inappropriate times during the day   Social Determinants of Health   Financial Resource Strain: Not on file  Food Insecurity: Not on file  Transportation Needs: Not on file  Physical Activity: Not on file  Stress: Not on file  Social Connections: Not on file    Family History  Adopted: Yes  Problem Relation Age of Onset   Diabetes Other        BIOLOGICAL AUNT   Heart disease Other        BIOLOGICAL AUNT    Past Medical History:  Diagnosis Date   Asthma    Colitis    Depression    Diabetes mellitus without complication (HCC)    GERD (gastroesophageal reflux disease)    Heart murmur    Hypercholesteremia    Hypertension    Retinitis pigmentosa    Sleep apnea  doesnt wear BIPAP currently   Stroke [facility]) Nov. 2015   no residual    Past Surgical History:  Procedure Laterality Date   BONE GRAFT HIP ILIAC CREST     CATARACT EXTRACTION     IUD REMOVAL     LOBECTOMY Left 02/24/2017   Procedure: Left upper lobe LOBECTOMY;  Surgeon: Melrose Nakayama, MD;  Location: [place];  Service: Thoracic;  Laterality: Left;   NODE DISSECTION Left 02/24/2017   Procedure: NODE  DISSECTION;  Surgeon: Melrose Nakayama, MD;  Location: [place];  Service: Thoracic;  Laterality: Left;   OOPHORECTOMY Right 2004   removal of cyst and ovary   RETINAL CRYOABLATION Bilateral    patient unsure of whether she had this   R[place] (VATS)/WEDGE RESECTION Left 02/24/2017   Procedure: Left VIDEO ASSISTED THORACOSCOPY (VATS)/WEDGE RESECTION;  Surgeon: Melrose Nakayama, MD;  Location: [place];  Service: Thoracic;  Laterality: Left;     Current Outpatient Medications on File Prior to Visit  Medication Sig Dispense Refill   acetaminophen (TYLENOL) 500 MG tablet Take 2 tablets (1,000 mg total) by mouth every 6 (six) hours as needed. 30 tablet 0   amLODipine (NORVASC) 10 MG tablet Take 1 tablet (10 mg total) by mouth daily. 30 tablet 3   anastrozole (ARIMIDEX) 1 MG tablet      aspirin EC 325 MG EC tablet Take 1 tablet (325 mg total) by mouth daily. 30 tablet 0   benzonatate (TESSALON) 100 MG capsule Take 1-2 capsules (100-200 mg total) by mouth 3 (three) times daily as needed. 60 capsule 0   cetirizine (ZYRTEC ALLERGY) 10 MG tablet Take 1 tablet (10 mg total) by mouth daily. 30 tablet 0   [employer] FIBER GUMMIES PO Take 2 capsules by mouth daily.     dexamethasone (OZURDEX) 0.7 MG IMPL 0.7 mg by Intravitreal route every 3 (three) months.      docusate sodium (COLACE) 100 MG capsule Take 200 mg by mouth at bedtime.     dorzolamide (TRUSOPT) 2 % ophthalmic solution Place 1 drop into both eyes 2 (two) times daily.     EPINEPHrine 0.3 mg/0.3 mL IJ SOAJ injection      fluticasone (FLONASE) 50 MCG/ACT nasal spray      GLIPIZIDE PO Take by mouth.     hydrochlorothiazide (HYDRODIURIL) 25 MG tablet Take 25 mg by mouth daily.     HYDROcodone-acetaminophen (NORCO/VICODIN) 5-325 MG tablet Take 1 tablet by mouth every 6 (six) hours as needed. 30 tablet 0   HYDROmorphone (DILAUDID) 2 MG tablet Take 1-2 tablets (2-4 mg total) by mouth every 4 (four) hours as needed for severe pain. 30  tablet 0   lisinopril (PRINIVIL,ZESTRIL) 5 MG tablet Take 1 tablet (5 mg total) by mouth daily. 30 tablet 3   metFORMIN (GLUCOPHAGE) 1000 MG tablet      metFORMIN (GLUCOPHAGE) 500 MG tablet Take 500 mg by mouth 2 (two) times daily with a meal.     nystatin (MYCOSTATIN) 100000 UNIT/ML suspension      ondansetron (ZOFRAN) 8 MG tablet      oxyCODONE (OXY IR/ROXICODONE) 5 MG immediate release tablet      pantoprazole (PROTONIX) 40 MG tablet Take 80 mg by mouth daily.      potassium chloride SA (K-DUR,KLOR-CON) 20 MEQ tablet Take 1 tablet (20 mEq total) by mouth daily. 30 tablet 3   promethazine-dextromethorphan (PROMETHAZINE-DM) 6.25-15 MG/5ML syrup Take 5 mLs by mouth at bedtime as needed for cough.  100 mL 0   QUEtiapine (SEROQUEL) 25 MG tablet Take 25 mg by mouth at bedtime.     rosuvastatin (CRESTOR) 20 MG tablet Take 20 mg by mouth every evening.     timolol (BETIMOL) 0.5 % ophthalmic solution Place 1 drop into both eyes 2 (two) times daily.     timolol (TIMOPTIC) 0.5 % ophthalmic solution AS DIRECTED INSTILL 1 DROP IN BOTH EYES TWICE A DAY  2   venlafaxine XR (EFFEXOR-XR) 37.5 MG 24 hr capsule Take 187.5 mg by mouth at bedtime.     No current facility-administered medications on file prior to visit.    Allergies  Allergen Reactions   Shellfish Allergy Anaphylaxis   Hydrochlorothiazide     Other reaction(s): Other (See Comments)   Lansoprazole     Other reaction(s): Other (See Comments)   Metformin Nausea Only and Diarrhea    Other reaction(s): GI Upset (intolerance)   Omeprazole     Other reaction(s): Other (See Comments)   Petrolatum-Zinc Oxide     Other reaction(s): Other (See Comments)   Pravastatin     Other reaction(s): Other (See Comments)   Simvastatin     Other reaction(s): MYALGIA, Myalgias (intolerance)   Tomato     Other reaction(s): Other (See Comments)   Bupropion Hives and Rash    Physical exam:  Today's Vitals   07/07/21 1114  BP: 116/74  Pulse: 83   Weight: 203 lb 8 oz (92.3 kg)  Height: 5\' 4"  (1.626 m)   Body mass index is 34.93 kg/m.   Wt Readings from Last 3 Encounters:  07/07/21 203 lb 8 oz (92.3 kg)  07/10/18 201 lb (91.2 kg)  10/06/17 213 lb 6.4 oz (96.8 kg)     Ht Readings from Last 3 Encounters:  07/07/21 5\' 4"  (1.626 m)  07/10/18 5\' 4"  (1.626 m)  10/06/17 5\' 4"  (1.626 m)      General: The patient is awake, alert and appears not in acute distress. The patient is well groomed. Head: Normocephalic, atraumatic. Neck is supple.  Mallampati ,3- red and odematous airway and soft palate.   neck circumference:15.5 inches . Nasal airflow not  patent.   Retrognathia is seen.  Dental status: biological  Cardiovascular:  Regular rate and cardiac rhythm by pulse,  without distended neck veins. Respiratory: Lungs are clear to auscultation.  Skin:  Without evidence of ankle edema, or rash. Trunk: The patient's posture is erect.   Neurologic exam : The patient is awake and alert, oriented to place and time.   Memory subjective described as intact.  Attention span & concentration ability appears normal.  Speech is fluent,  without  dysarthria, dysphonia or aphasia.  Mood and affect are appropriate.   Cranial nerves: no loss of smell or taste reported  Pupils are equal and briskly reactive to light. Funduscopic exam deferred. .  Extraocular movements in vertical and horizontal planes were intact and without nystagmus. No Diplopia. Visual fields by finger perimetry are intact. Hearing was intact to soft voice and finger rubbing.    Facial sensation intact to fine touch.  Facial motor strength is symmetric and tongue and uvula move midline.  Neck ROM : rotation, tilt and flexion extension were normal for age and shoulder shrug was symmetrical.    Motor exam:  Symmetric bulk, tone and ROM.   Normal tone without cog- wheeling, symmetric grip strength .   Sensory:  Fine touch and vibration were tested  and  normal.   Proprioception tested  in the upper extremities was normal.   Coordination: Rapid alternating movements in the fingers/hands were of normal speed.  The Finger-to-nose maneuver was intact without evidence of ataxia, dysmetria or tremor.   Gait and station: Patient could rise unassisted from a seated position, walked without assistive device.  Stance is of normal width/ base and the patient turned with 3 steps.  Toe and heel walk were deferred.  Deep tendon reflexes: in the  upper and lower extremities are symmetric and intact.  Babinski response was deferred.       After spending a total time of  50  minutes face to face and additional time for physical and neurologic examination, review of laboratory studies,  personal review of imaging studies, reports and results of other testing and review of referral information / records as far as provided in visit, I have established the following assessments:  1) insomnia is chronic  2) nasal patency and sinus disease affect her ability to treat OSA- ENT ordered. Smoking cessation.  3) HST or attended sleep study with SPLIT at [facility] 20 will only work if nasal patency improves.    My Plan is to proceed with: 0) [facility] by [facility], see [facility] prescribed  medication and cognitive therapy  1)referral to ENT, they may order sinus CT.  2) improve airway and pulmonary health by smoking cessation is needed, but patient is not willing!  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     I would like to thank the [place], [address] [place],  [place] [id number] for allowing me to meet with and to take care of this pleasant patient.     Electronically signed by: Larey Seat, MD 07/07/2021 11:50 AM  [facility] Board certified by The [employer] of Sleep Medicine and Diplomate of the [employer] of Sleep Medicine. Board certified In Neurology through the [employer], Fellow of the [employer] of Neurology. Medical Director of [employer].

## 2021-07-07 NOTE — Patient Instructions (Signed)
My Plan is to proceed with: 0) [facility] by [facility], see [facility] prescribed  medication and cognitive therapy  1)referral to ENT, they may order sinus CT.  2) improve airway and pulmonary health by smoking cessation is needed, but patient is not willing!  3) SPLIT study at AHI 20- .at [facility] 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    Insomnia Insomnia is a sleep disorder that makes it difficult to fall asleep or stay asleep. Insomnia can cause fatigue, low energy, difficulty concentrating, mood swings, and poor performance at work or school. There are three different ways to classify insomnia: Difficulty falling asleep. Difficulty staying asleep. Waking up too early in the morning. Any type of insomnia can be long-term (chronic) or short-term (acute). Both are common. Short-term insomnia usually lasts for three months or less. Chronic insomnia occurs at least three times a week for longer than three months. What are the causes? Insomnia may be caused by another condition, situation, or substance, such as: Anxiety. Certain medicines. Gastroesophageal reflux disease (GERD) or other gastrointestinal conditions. Asthma or other breathing conditions. Restless legs syndrome, sleep apnea, or other sleep disorders. Chronic pain. Menopause. Stroke. Abuse of alcohol, tobacco, or illegal drugs. Mental health conditions, such as depression. Caffeine. Neurological disorders, such as Alzheimer's disease. An overactive thyroid (hyperthyroidism). Sometimes, the cause of insomnia may not be known. What increases the risk? Risk factors for insomnia include: Gender. Women are affected more often than men. Age. Insomnia is more common as you get older. Stress. Lack of exercise. Irregular work schedule or working night shifts. Traveling between different time zones. Certain medical and mental health conditions. What are the signs or  symptoms? If you have insomnia, the main symptom is having trouble falling asleep or having trouble staying asleep. This may lead to other symptoms, such as: Feeling fatigued or having low energy. Feeling nervous about going to sleep. Not feeling rested in the morning. Having trouble concentrating. Feeling irritable, anxious, or depressed. How is this diagnosed? This condition may be diagnosed based on: Your symptoms and medical history. Your health care provider may ask about: Your sleep habits. Any medical conditions you have. Your mental health. A physical exam. How is this treated? Treatment for insomnia depends on the cause. Treatment may focus on treating an underlying condition that is causing insomnia. Treatment may also include: Medicines to help you sleep. Counseling or therapy. Lifestyle adjustments to help you sleep better. Follow these instructions at home: Eating and drinking  Limit or avoid alcohol, caffeinated beverages, and cigarettes, especially close to bedtime. These can disrupt your sleep. Do not eat a large meal or eat spicy foods right before bedtime. This can lead to digestive discomfort that can make it hard for you to sleep. Sleep habits  Keep a sleep diary to help you and your health care provider figure out what could be causing your insomnia. Write down: When you sleep. When you wake up during the night. How well you sleep. How rested you feel the next day. Any side effects of medicines you are taking. What you eat and drink. Make your bedroom a dark, comfortable place where it is easy to fall asleep. Put up shades or blackout curtains to block light from outside. Use a white noise machine to block noise. Keep the temperature cool. Limit screen use before bedtime. This includes: Watching TV. Using your smartphone, tablet, or computer. Stick to a routine  that includes going to bed and waking up at the same times every day and night. This can help  you fall asleep faster. Consider making a quiet activity, such as reading, part of your nighttime routine. Try to avoid taking naps during the day so that you sleep better at night. Get out of bed if you are still awake after 15 minutes of trying to sleep. Keep the lights down, but try reading or doing a quiet activity. When you feel sleepy, go back to bed. General instructions Take over-the-counter and prescription medicines only as told by your health care provider. Exercise regularly, as told by your health care provider. Avoid exercise starting several hours before bedtime. Use relaxation techniques to manage stress. Ask your health care provider to suggest some techniques that may work well for you. These may include: Breathing exercises. Routines to release muscle tension. Visualizing peaceful scenes. Make sure that you drive carefully. Avoid driving if you feel very sleepy. Keep all follow-up visits as told by your health care provider. This is important. Contact a health care provider if: You are tired throughout the day. You have trouble in your daily routine due to sleepiness. You continue to have sleep problems, or your sleep problems get worse. Get help right away if: You have serious thoughts about hurting yourself or someone else. If you ever feel like you may hurt yourself or others, or have thoughts about taking your own life, get help right away. You can go to your nearest emergency department or call: Your local emergency services (911 in the [place] A suicide crisis helpline, such as the [place] at (331)022-9666 or 988 in the [place]. This is open 24 hours a day. Summary Insomnia is a sleep disorder that makes it difficult to fall asleep or stay asleep. Insomnia can be long-term (chronic) or short-term (acute). Treatment for insomnia depends on the cause. Treatment may focus on treating an underlying condition that is causing insomnia. Keep a sleep  diary to help you and your health care provider figure out what could be causing your insomnia. This information is not intended to replace advice given to you by your health care provider. Make sure you discuss any questions you have with your health care provider. Document Revised: 12/30/2020 Document Reviewed: 04/16/2020 Elsevier Patient Education  2022 [employer].

## 2021-08-15 ENCOUNTER — Encounter (HOSPITAL_COMMUNITY): Payer: Self-pay | Admitting: Emergency Medicine

## 2021-08-15 ENCOUNTER — Other Ambulatory Visit: Payer: Self-pay

## 2021-08-15 ENCOUNTER — Ambulatory Visit (HOSPITAL_COMMUNITY)
Admission: EM | Admit: 2021-08-15 | Discharge: 2021-08-15 | Disposition: A | Discharge status: Home / Self Care | Payer: Non-veteran care | Attending: Family Medicine | Admitting: Family Medicine

## 2021-08-15 DIAGNOSIS — E1165 Type 2 diabetes mellitus with hyperglycemia: Secondary | ICD-10-CM

## 2021-08-15 LAB — CBG MONITORING, ED: Glucose-Capillary: 150 mg/dL — ABNORMAL HIGH (ref 70–99)

## 2021-08-15 NOTE — Discharge Instructions (Addendum)
Your sugar with our meter here was 150.  Continue checking your sugar at home once or twice a day.  Call your clinical pharmacist tomorrow when they are open to discuss your sugars.

## 2021-08-15 NOTE — ED Provider Notes (Addendum)
[place]    CSN: [id number] Arrival date & time: 08/15/21  1018      History   Chief Complaint Chief Complaint  Patient presents with   Hyperglycemia    HPI Susan Farley is a 61 y.o. female.    Hyperglycemia Here for elevated sugar.  She just got started on Victoza per the [place] in the last week or 2.  She had an older meter and it was reading 204 every time she checked it.  She received a new meter in the mail in the last couple of days and it is read in the 270 range now.  She stopped taking metformin because it along with the Victoza was making her have some stomach upset.  She is not taking glipizide at present.  She has lost 20 pounds in the last few weeks.  She feels that is mainly due to the Victoza.  She is on the 1.8 daily dose of the Victoza  No fever or chills, and no upper respiratory symptoms, and no GI symptoms.  No nausea or vomiting or diarrhea  Past Medical History:  Diagnosis Date   Asthma    Colitis    Depression    Diabetes mellitus without complication ([place])    GERD (gastroesophageal reflux disease)    Heart murmur    Hypercholesteremia    Hypertension    Retinitis pigmentosa    Sleep apnea    doesnt wear BIPAP currently   Stroke [facility]) Nov. 2015   no residual    Patient Active Problem List   Diagnosis Date Noted   Intolerance of continuous positive airway pressure (CPAP) ventilation 07/07/2021   Chronic nasal congestion 07/07/2021   Chronic ethmoidal sinusitis 07/07/2021   Nicotine dependence 07/07/2021   Severe obstructive sleep apnea-hypopnea syndrome 07/07/2021   Carcinoma of upper-outer quadrant of female breast, right ([place]) 07/10/2018   Stage I squamous cell carcinoma of left lung ([place]) 04/11/2017   Lung nodule 03/24/2017   S/P lobectomy of lung 02/24/2017   Peripheral arterial disease ([place]) 04/26/2016   Tobacco use disorder 02/10/2015   OSA on CPAP 02/10/2015   Type 2 diabetes mellitus with other circulatory  complications ([place])    Numbness on right side    Stroke ([place])    Acute ischemic VBA thalamic stroke ([place]) 04/24/2014   Essential hypertension 04/24/2014   Hyperglycemia 04/24/2014   Acute CVA (cerebrovascular accident) ([place]) 04/24/2014   Leukocytosis 04/24/2014   Hypokalemia 04/24/2014   Numbness    HTN (hypertension) 07/25/2012   HLD (hyperlipidemia) 07/25/2012   Retinitis pigmentosa 07/25/2012   GERD (gastroesophageal reflux disease) 07/25/2012    Past Surgical History:  Procedure Laterality Date   BONE GRAFT HIP ILIAC CREST     CATARACT EXTRACTION     IUD REMOVAL     LOBECTOMY Left 02/24/2017   Procedure: Left upper lobe LOBECTOMY;  Surgeon: Melrose Nakayama, MD;  Location: [place] [facility];  Service: Thoracic;  Laterality: Left;   NODE DISSECTION Left 02/24/2017   Procedure: NODE DISSECTION;  Surgeon: Melrose Nakayama, MD;  Location: [facility] OR;  Service: Thoracic;  Laterality: Left;   OOPHORECTOMY Right 2004   removal of cyst and ovary   RETINAL CRYOABLATION Bilateral    patient unsure of whether she had this   [place] (VATS)/WEDGE RESECTION Left 02/24/2017   Procedure: Left VIDEO ASSISTED THORACOSCOPY (VATS)/WEDGE RESECTION;  Surgeon: Melrose Nakayama, MD;  Location: [facility];  Service: Thoracic;  Laterality: Left;  OB History   No obstetric history on file.      Home Medications    Prior to Admission medications   Medication Sig Start Date End Date Taking? Authorizing Provider  acetaminophen (TYLENOL) 500 MG tablet Take 2 tablets (1,000 mg total) by mouth every 6 (six) hours as needed. 02/28/17   Barrett, Erin R, PA-C  amLODipine (NORVASC) 10 MG tablet Take 1 tablet (10 mg total) by mouth daily. 02/28/17   Barrett, Erin R, PA-C  anastrozole (ARIMIDEX) 1 MG tablet  06/04/18   [provider]  aspirin EC 325 MG EC tablet Take 1 tablet (325 mg total) by mouth daily. 04/25/14   Mikhail, Velta Addison, DO  cetirizine (ZYRTEC ALLERGY) 10 MG tablet Take 1  tablet (10 mg total) by mouth daily. 10/26/19   Jaynee Eagles, PA-C  [employer] FIBER GUMMIES PO Take 2 capsules by mouth daily.    [provider]  dexamethasone (OZURDEX) 0.7 MG IMPL 0.7 mg by Intravitreal route every 3 (three) months.     [provider]  docusate sodium (COLACE) 100 MG capsule Take 200 mg by mouth at bedtime.    [provider]  dorzolamide (TRUSOPT) 2 % ophthalmic solution Place 1 drop into both eyes 2 (two) times daily.    [provider]  EPINEPHrine 0.3 mg/0.3 mL IJ SOAJ injection  05/28/18   [provider]  fluticasone (FLONASE) 50 MCG/ACT nasal spray  05/28/18   [provider]  hydrochlorothiazide (HYDRODIURIL) 25 MG tablet Take 25 mg by mouth daily.    [provider]  lisinopril (PRINIVIL,ZESTRIL) 5 MG tablet Take 1 tablet (5 mg total) by mouth daily. 02/28/17   Barrett, Erin R, PA-C  nystatin (MYCOSTATIN) 100000 UNIT/ML suspension  05/29/18   [provider]  ondansetron (ZOFRAN) 8 MG tablet  04/27/18   [provider]  pantoprazole (PROTONIX) 40 MG tablet Take 80 mg by mouth daily.     [provider]  potassium chloride SA (K-DUR,KLOR-CON) 20 MEQ tablet Take 1 tablet (20 mEq total) by mouth daily. Patient not taking: Reported on 08/15/2021 02/28/17   Barrett, Lodema Hong, PA-C  QUEtiapine (SEROQUEL) 25 MG tablet Take 25 mg by mouth at bedtime.    [provider]  rosuvastatin (CRESTOR) 20 MG tablet Take 20 mg by mouth every evening.    [provider]  timolol (BETIMOL) 0.5 % ophthalmic solution Place 1 drop into both eyes 2 (two) times daily.    [provider]  timolol (TIMOPTIC) 0.5 % ophthalmic solution AS DIRECTED INSTILL 1 DROP IN BOTH EYES TWICE A DAY 02/19/17   [provider]  venlafaxine XR (EFFEXOR-XR) 37.5 MG 24 hr capsule Take 187.5 mg by mouth at bedtime.    [provider]  VICTOZA 18 MG/3ML SOPN Inject into the skin. 08/12/21   [provider]    Family History Family History  Adopted: Yes  Problem Relation Age of Onset   Diabetes Other        BIOLOGICAL AUNT   Heart disease Other        BIOLOGICAL AUNT    Social History Social History   Tobacco Use   Smoking status: Every Day    Packs/day: 2.00    Years: 40.00    Pack years: 80.00    Types: Cigarettes    Last attempt to quit: 02/28/2017    Years since quitting: 4.4   Smokeless tobacco: Never   Tobacco comments:    2pk per  day   Vaping Use   Vaping Use: Former  Substance Use Topics   Alcohol use: Not Currently   Drug use: No     Allergies   Shellfish allergy, Hydrochlorothiazide, Lansoprazole, Metformin, Omeprazole, Petrolatum-zinc oxide, Pravastatin, Simvastatin, Tomato, and Bupropion   Review of Systems Review of Systems   Physical Exam Triage Vital Signs ED Triage Vitals  Enc Vitals Group     BP 08/15/21 1058 101/66     Pulse Rate 08/15/21 1058 80     Resp 08/15/21 1058 (!) 22     Temp 08/15/21 1058 99.2 F (37.3 C)     Temp Source 08/15/21 1058 Oral     SpO2 08/15/21 1058 93 %     Weight --      Height --      Head Circumference --      Peak Flow --      Pain Score 08/15/21 1053 0     Pain Loc --      Pain Edu? --      Excl. in [place]? --    No data found.  Updated Vital Signs BP 101/66 (BP Location: Left Arm) Comment (BP Location): large cuff   Pulse 80    Temp 99.2 F (37.3 C) (Oral)    Resp (!) 22    LMP 11/25/2011    SpO2 93%   Visual Acuity Right Eye Distance:   Left Eye Distance:   Bilateral Distance:    Right Eye Near:   Left Eye Near:    Bilateral Near:     Physical Exam Vitals reviewed.  Constitutional:      General: She is not in acute distress.    Appearance: She is not toxic-appearing.  HENT:     Mouth/Throat:     Mouth: Mucous membranes are moist.  Eyes:     Extraocular Movements: Extraocular movements intact.     Pupils: Pupils are equal, round, and reactive to light.  Cardiovascular:      Rate and Rhythm: Normal rate and regular rhythm.     Heart sounds: No murmur heard. Pulmonary:     Effort: Pulmonary effort is normal.     Breath sounds: Normal breath sounds. No wheezing or rhonchi.  Abdominal:     Palpations: Abdomen is soft.     Tenderness: There is no abdominal tenderness.  Musculoskeletal:     Cervical back: Neck supple.  Lymphadenopathy:     Cervical: No cervical adenopathy.  Skin:    Capillary Refill: Capillary refill takes less than 2 seconds.     Coloration: Skin is not jaundiced or pale.  Neurological:     Mental Status: She is alert and oriented to person, place, and time.  Psychiatric:        Behavior: Behavior normal.     UC Treatments / Results  Labs (all labs ordered are listed, but only abnormal results are displayed) Labs Reviewed  CBG MONITORING, ED - Abnormal; Notable for the following components:      Result Value   Glucose-Capillary 150 (*)    All other components within normal limits    EKG   Radiology No results found.  Procedures Procedures (including critical care time)  Medications Ordered in UC Medications - No data to display  Initial Impression / Assessment and Plan / UC Course  I have reviewed the triage vital signs and the nursing notes.  Pertinent labs & imaging results that were available during my care of the patient were  reviewed by me and considered in my medical decision making (see chart for details).     Fingerstick glucose here is 150.  She has not eaten anything today since she got the high reading this morning.  She has had a cup of coffee with no calories in it.  She will see her [place] clinical pharmacist this coming week in the next few days.  She also states that she can call them anytime.  I have asked her today to call them tomorrow when they are open to get advice.  Discussed that her meter may or may not be completely accurate, and that sugars can vary through the day. Final Clinical Impressions(s) /  UC Diagnoses   Final diagnoses:  Type 2 diabetes mellitus with hyperglycemia, without long-term current use of insulin ([place])     Discharge Instructions      Your sugar with our meter here was 150.  Continue checking your sugar at home once or twice a day.  Call your clinical pharmacist tomorrow when they are open to discuss your sugars.       ED Prescriptions   None    I have reviewed the PDMP during this encounter.   Barrett Henle, MD 08/15/21 1124    Barrett Henle, MD 08/15/21 1124

## 2021-08-15 NOTE — ED Triage Notes (Signed)
Reports cbg 270 this morning.  Denies recent illness. Patient reports she is taking medications as instructed.  Denies pain

## 2021-09-02 ENCOUNTER — Telehealth: Payer: Self-pay

## 2021-09-02 NOTE — Telephone Encounter (Signed)
LVM for pt to call me back to schedule sleep study  

## 2021-09-13 ENCOUNTER — Ambulatory Visit (INDEPENDENT_AMBULATORY_CARE_PROVIDER_SITE_OTHER): Payer: No Typology Code available for payment source | Admitting: Neurology

## 2021-09-13 DIAGNOSIS — C50411 Malignant neoplasm of upper-outer quadrant of right female breast: Secondary | ICD-10-CM

## 2021-09-13 DIAGNOSIS — E1159 Type 2 diabetes mellitus with other circulatory complications: Secondary | ICD-10-CM

## 2021-09-13 DIAGNOSIS — G4733 Obstructive sleep apnea (adult) (pediatric): Secondary | ICD-10-CM

## 2021-09-13 DIAGNOSIS — I739 Peripheral vascular disease, unspecified: Secondary | ICD-10-CM

## 2021-09-13 DIAGNOSIS — F17218 Nicotine dependence, cigarettes, with other nicotine-induced disorders: Secondary | ICD-10-CM

## 2021-09-13 DIAGNOSIS — I6309 Cerebral infarction due to thrombosis of other precerebral artery: Secondary | ICD-10-CM

## 2021-09-13 DIAGNOSIS — Z789 Other specified health status: Secondary | ICD-10-CM

## 2021-09-13 DIAGNOSIS — J322 Chronic ethmoidal sinusitis: Secondary | ICD-10-CM

## 2021-09-13 DIAGNOSIS — R0981 Nasal congestion: Secondary | ICD-10-CM

## 2021-09-13 DIAGNOSIS — Z91199 Patient's noncompliance with other medical treatment and regimen due to unspecified reason: Secondary | ICD-10-CM

## 2021-09-20 DIAGNOSIS — Z91199 Patient's noncompliance with other medical treatment and regimen due to unspecified reason: Secondary | ICD-10-CM | POA: Insufficient documentation

## 2021-09-20 NOTE — Procedures (Signed)
PATIENT'S NAME:  Susan Farley, Susan Farley ?DOB:      09/02/1960      ?MR#:    [id number]     ?DATE OF RECORDING: 09/13/2021 Zenon Mayo.  ?REFERRING M.D.:  [facility] Randa Lynn, NP ?Study Performed:   Baseline Polysomnogram ?This Patient was scheduled for a SPLIT night polysomnography and her AHI was high enough to qualify but she refused CPAP initiation and broke off the study early.  ?HISTORY:  Susan Farley was seen in consultation on 07-08-2021.  has a past medical history of Asthma, Carotid stenosis, Colitis, cyclic Depression, Diabetes mellitus without complication ([place]), GERD (gastroesophageal reflux disease), GAD, Panic on Effexor and Seroquel, Hypercholesteremia, Hypertension, Retinitis pigmentosa, Smoking, septum nasi deviation, Sleep apnea ( 2019 , [facility]) ,Breast cancer 2019,  Ascites, rhinitis,  and thalamic Stroke [facility]) (Nov. 2015).05-08-2014: ?Stroke History Summary Dr. Erlinda Hong, ?Susan Farley is a female with a history of HTN, HLD, DM, smoker and retinitis pigmentosa was admitted to [facility]14 for 9 day period of numbness involving the right side. Numbness involving face arm and leg. She had no weakness and no changes in speech or swallowing. 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 She remains a heavy smoker with no intention of quitting. 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 CTA showed 50% narrowing or left ICA no significant stenosis.  ? ?The patient endorsed the Epworth Sleepiness Scale at 15 points.   ?The patient's weight 203 pounds with a height of 64 (inches), resulting in a BMI of 34.6 kg/m2. ?The patient's neck circumference measured 15.5 inches. ? ?CURRENT MEDICATIONS: Tylenol, Norvasc, Arimidex, Aspirin, Tessalon, Zyrtec, Fiber, Colace, Azurdex, Trusopt, Epinephrine, Flonase, Glipizide, Hydrodiuril, Vicodin, Dilaudid,  ?Zestril, Glucophage, Mycostatin, Zofran, Protonix, Klor-Con, Promethazine, ?  ?PROCEDURE:  This is a multichannel digital polysomnogram utilizing the  Somnostar 11.2 system.  Electrodes and sensors were applied and monitored per AASM Specifications.   EEG, EOG, Chin and Limb EMG, were sampled at 200 Hz.  ECG, Snore and Nasal Pressure, Thermal Airflow, Respiratory Effort, CPAP Flow and Pressure, Oximetry was sampled at 50 Hz. Digital video and audio were recorded.     ? ?BASELINE STUDY: Lights Out was at 21:55 and Lights On at 04:39.  Total recording time (TRT) was 404.5 minutes, with a total sleep time (TST) of 323.5 minutes.  The patient's sleep latency was 44.5 minutes.  REM latency was 219.5 minutes.  The sleep efficiency was 80.0 %.  ?The patient had many arousals due to coughing.   ?   ?SLEEP ARCHITECTURE: WASO (Wake after sleep onset) was 59.5 minutes.  There were 20.5 minutes in Stage N1, 287.5 minutes Stage N2, 11 minutes Stage N3 and 4.5 minutes in Stage REM.  The percentage of Stage N1 was 6.3%, Stage N2 was 88.9%, Stage N3 was 3.4% and Stage R (REM sleep) was 1.4%.  ? ? ? ?RESPIRATORY ANALYSIS:  There were a total of 230 respiratory events:  41 obstructive apneas, 0 central apneas and 0 mixed apneas with a total of 41 apneas and an apnea index (AI) of 7.6 /hour. There were 189 hypopneas with a hypopnea index of 35.1 /hour.  ?    ?The total APNEA/HYPOPNEA INDEX (AHI) was 42.7/hour.  5 events occurred in REM sleep and 370 events in NREM. The REM AHI was 66.7 /hour, versus a non-REM AHI of 42.3.  ?The patient spent all 324 minutes of sleep time in non-supine position. = non-supine AHI of 42.7. ? ?OXYGEN SATURATION & C02:  The Wake baseline 02 saturation was 89%, with the lowest being 73%. Time spent below 89% saturation equaled 249 minutes. See screen shots.  ? ?The arousals were noted as: 94 were spontaneous, 0 were associated with PLMs, 122 were associated with respiratory events. REM sleep was suppressed, likely by medication.   ?The patient had a total of 0 Periodic Limb Movements.   ?Audio and video analysis did show many coughing events, many sips of  water.  ?EKG was in keeping with normal sinus rhythm and intermitted extra beats, PACs, PVCs.  ? ?IMPRESSION: ? ?Severe hypoxia (249 minutes, nadir 02 at 73%) and severe Obstructive Sleep Apnea (OSA, AHI of 42.7/h) in a patient with underlying smoking induced lung dysfunction.  ?Frequent coughing, needing to drink water, and other arousals. Patient wanted to leave to smoke.  ?Non-specific abnormal EKG. ?Pre study, the patient was willing to apply CPAP if indicated, ?Mid study she changed her mind and wanted to leave before 1 AM, categorically refused using CPAP and mask fitting.  ?Post-study, the patient indicated that sleep was the same as usual.  ? ?RECOMMENDATIONS: Non-compliance with medical testing and management ? ?The patient refused the indicated CPAP titration part of the study, needed to optimize therapy for OSA and hypoxia. ? Please send the results to her referring provider.  ? ?The patient will not follow up at [facility] and will be referred back to [facility] psychiatry and her primary care provider.  ? ?I certify that I have reviewed the entire raw data recording prior to the issuance of this report in accordance with the Standards of Accreditation of the [place] [employer] (AASM) ? ?Larey Seat, MD ?[place], American Board of Neurology, [place], [place] of Sleep Medicine ?Medical Director, [place] [facility] at [facility] ? ? ? ?

## 2021-09-20 NOTE — Progress Notes (Signed)
1. Severe hypoxia (249 minutes, nadir 02 at 73%) and severe Obstructive Sleep Apnea (OSA, AHI of 42.7/h) in a patient with underlying smoking induced lung dysfunction. 2. Pre study, the patient was willing to apply CPAP if indicated, ?3. Mid study she changed her mind and wanted to leave before 1 AM, categorically refused using CPAP and mask fitting.  ?4. Frequent coughing, needing to drink water, and other arousals. Patient wanted to leave to smoke.  Non-compliance with medical testing and management ? ?Will not be followed at [place] sleep - I refuse further care, please return to referring provider.

## 2021-09-22 ENCOUNTER — Telehealth: Payer: Self-pay | Admitting: Neurology

## 2021-09-22 NOTE — Telephone Encounter (Signed)
-----   Message from Larey Seat, MD sent at 09/20/2021  6:06 PM EDT ----- ?1. Severe hypoxia (249 minutes, nadir 02 at 73%) and severe Obstructive Sleep Apnea (OSA, AHI of 42.7/h) in a patient with underlying smoking induced lung dysfunction. 2. Pre study, the patient was willing to apply CPAP if indicated, ?3. Mid study she changed her mind and wanted to leave before 1 AM, categorically refused using CPAP and mask fitting.  ?4. Frequent coughing, needing to drink water, and other arousals. Patient wanted to leave to smoke.  Non-compliance with medical testing and management ? ?Will not be followed at [place] sleep - I refuse further care, please return to referring provider.  ?

## 2021-09-22 NOTE — Telephone Encounter (Signed)
Called the pt. There was no answer. LVM asking for a call back to discuss the sleep study and Dr Dohmeier's plan moving forward.  ?

## 2021-09-29 NOTE — Telephone Encounter (Signed)
Called and spoke with pt. Relayed results per Dr. Edwena Felty note. She verbalized understanding and will f/u with referring provider for next steps. ?

## 2022-06-17 ENCOUNTER — Ambulatory Visit (HOSPITAL_COMMUNITY)
Admission: RE | Admit: 2022-06-17 | Discharge: 2022-06-17 | Disposition: A | Discharge status: Home / Self Care | Payer: No Typology Code available for payment source | Source: Ambulatory Visit | Attending: Internal Medicine | Admitting: Internal Medicine

## 2022-06-17 ENCOUNTER — Encounter (HOSPITAL_COMMUNITY): Payer: Self-pay

## 2022-06-17 VITALS — BP 124/73 | HR 84 | Temp 98.0°F | Resp 17

## 2022-06-17 DIAGNOSIS — I10 Essential (primary) hypertension: Secondary | ICD-10-CM

## 2022-06-17 NOTE — ED Triage Notes (Signed)
Pt presents for a blood pressure check. States the [place] advised her to come be evaluated. States she was on hydrochlorothiazide for her BP and was taken off due to hypotension. States she noticed her feet swelling 3 days ago. Has been using compression stockings for support.

## 2022-06-17 NOTE — ED Provider Notes (Signed)
[place]    CSN: [id number] Arrival date & time: 06/17/22  1119      History   Chief Complaint Chief Complaint  Patient presents with   Blood Pressure Check    HPI Susan Farley is a 61 y.o. female with a history of hypertension comes to the urgent care for blood pressure recheck.  Patient was previously managed with hydrochlorothiazide 25 mg orally daily but that was discontinued because of low blood pressure readings.  Since the medication was discontinued, patient has noticed swelling in the lower extremities.  She complains of shortness of breath when she lays flat.  No chest pain or chest pressure.  No nausea or vomiting.  No fever or chills.  No cough or sputum production.  Patient has a history of obstructive sleep apnea and is noncompliant with CPAP.   HPI  Past Medical History:  Diagnosis Date   Asthma    Colitis    Depression    Diabetes mellitus without complication (HCC)    GERD (gastroesophageal reflux disease)    Heart murmur    Hypercholesteremia    Hypertension    Retinitis pigmentosa    Sleep apnea    doesnt wear BIPAP currently   Stroke [facility]) Nov. 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 ([place]) 04/11/2017   Lung nodule 03/24/2017   S/P lobectomy of lung 02/24/2017   Peripheral arterial disease ([place]) 04/26/2016   Tobacco use disorder 02/10/2015   OSA on CPAP 02/10/2015   Type 2 diabetes mellitus with other circulatory complications ([place])    Numbness on right side     Stroke ([place])    Acute ischemic VBA thalamic stroke ([place]) 04/24/2014   Essential hypertension 04/24/2014   Hyperglycemia 04/24/2014   Acute CVA (cerebrovascular accident) ([place]) 04/24/2014   Leukocytosis 04/24/2014   Hypokalemia 04/24/2014   Numbness    HTN (hypertension) 07/25/2012   HLD (hyperlipidemia) 07/25/2012   Retinitis pigmentosa 07/25/2012   GERD (gastroesophageal reflux disease) 07/25/2012    Past Surgical History:  Procedure Laterality Date   BONE GRAFT HIP ILIAC CREST     CATARACT EXTRACTION     IUD REMOVAL     LOBECTOMY Left 02/24/2017   Procedure: Left upper lobe LOBECTOMY;  Surgeon: Melrose Nakayama, MD;  Location: [place];  Service: Thoracic;  Laterality: Left;   NODE DISSECTION Left 02/24/2017   Procedure: NODE DISSECTION;  Surgeon: Melrose Nakayama, MD;  Location: [place];  Service: Thoracic;  Laterality: Left;   OOPHORECTOMY Right 2004   removal of cyst and ovary   RETINAL CRYOABLATION Bilateral    patient unsure of whether she had this   [place] (VATS)/WEDGE RESECTION Left 02/24/2017   Procedure: Left VIDEO ASSISTED THORACOSCOPY (VATS)/WEDGE RESECTION;  Surgeon: Melrose Nakayama, MD;  [facility];  Service: Thoracic;  Laterality: Left;    OB History   No obstetric history on file.      Home Medications    Prior to Admission medications   Medication Sig Start Date End Date Taking? Authorizing  Provider  acetaminophen (TYLENOL) 500 MG tablet Take 2 tablets (1,000 mg total) by mouth every 6 (six) hours as needed. 02/28/17   Barrett, Erin R, PA-C  amLODipine (NORVASC) 10 MG tablet Take 1 tablet (10 mg total) by mouth daily. 02/28/17   Barrett, Erin R, PA-C  anastrozole (ARIMIDEX) 1 MG tablet  06/04/18   [provider]  aspirin EC 325 MG EC tablet Take 1 tablet (325 mg total) by mouth daily. 04/25/14   Mikhail, Velta Addison, DO  cetirizine (ZYRTEC ALLERGY) 10 MG tablet Take 1 tablet (10 mg total) by mouth daily. 10/26/19   Jaynee Eagles, PA-C  [employer] F[employer]ER GUMMIES PO Take 2 capsules by mouth daily.    [provider]  dexamethasone (OZURDEX) 0.7 MG IMPL 0.7 mg by Intravitreal route every 3 (three) months.     [provider]  docusate sodium (COLACE) 100 MG capsule Take 200 mg by mouth at bedtime.    [provider]  dorzolamide (TRUSOPT) 2 % ophthalmic solution Place 1 drop into both eyes 2 (two) times daily.    [provider]  EPINEPHrine 0.3 mg/0.3 mL IJ SOAJ injection  05/28/18   [provider]  fluticasone (FLONASE) 50 MCG/ACT nasal spray  05/28/18   [provider]  hydrochlorothiazide (HYDRODIURIL) 25 MG tablet Take 25 mg by mouth daily.    [provider]  lisinopril (PRINIVIL,ZESTRIL) 5 MG tablet Take 1 tablet (5 mg total) by mouth daily. 02/28/17   Barrett, Erin R, PA-C  nystatin (MYCOSTATIN) 100000 UNIT/ML suspension  05/29/18   [provider]  ondansetron (ZOFRAN) 8 MG tablet  04/27/18   [provider]  pantoprazole (PROTONIX) 40 MG tablet Take 80 mg by mouth daily.     [provider]  potassium chloride SA (K-DUR,KLOR-CON) 20 MEQ tablet Take 1 tablet (20 mEq total) by mouth daily. Patient not taking: Reported on 08/15/2021 02/28/17   Barrett, Lodema Hong, PA-C  QUEtiapine (SEROQUEL) 25 MG tablet Take 25 mg by mouth at bedtime.    [provider]  rosuvastatin (CRESTOR) 20 MG tablet Take 20 mg by mouth every evening.    [provider]  timolol (BETIMOL) 0.5 % ophthalmic solution Place 1 drop into both eyes 2 (two) times daily.    [provider]  timolol (TIMOPTIC) 0.5 % ophthalmic solution AS DIRECTED INSTILL 1 DROP IN BOTH EYES TWICE A DAY 02/19/17   [provider]  venlafaxine XR (EFFEXOR-XR) 37.5 MG 24 hr capsule Take 187.5 mg by mouth at bedtime.    [provider]  VICTOZA 18 MG/3ML SOPN Inject into the skin. 08/12/21   [provider]    Family History Family History   Adopted: Yes  Problem Relation Age of Onset   Diabetes Other        BIOLOGICAL AUNT   Heart disease Other        BIOLOGICAL AUNT    Social History Social History   Tobacco Use   Smoking status: Every Day    Packs/day: 2.00    Years: 40.00    Total pack years: 80.00    Types: Cigarettes    Last attempt to quit: 02/28/2017    Years since quitting: 5.3   Smokeless tobacco: Never   Tobacco comments:    2pk per day   Vaping Use   Vaping Use: Former  Substance Use Topics   Alcohol use: Not Currently   Drug use: No     Allergies  Shellfish allergy, Hydrochlorothiazide, Lansoprazole, Metformin, Omeprazole, Petrolatum-zinc oxide, Pravastatin, Simvastatin, Tomato, and Bupropion   Review of Systems Review of Systems As per HPI  Physical Exam Triage Vital Signs ED Triage Vitals  Enc Vitals Group     BP 06/17/22 1138 124/73     Pulse Rate 06/17/22 1138 84     Resp 06/17/22 1138 17     Temp 06/17/22 1138 98 F (36.7 C)     Temp Source 06/17/22 1138 Oral     SpO2 06/17/22 1138 93 %     Weight --      Height --      Head Circumference --      Peak Flow --      Pain Score 06/17/22 1137 0     Pain Loc --      Pain Edu? --      Excl. in [place]? --    No data found.  Updated Vital Signs BP 124/73 (BP Location: Left Arm)   Pulse 84   Temp 98 F (36.7 C) (Oral)   Resp 17   LMP 09/27/2011   SpO2 93%   Visual Acuity Right Eye Distance:   Left Eye Distance:   Bilateral Distance:    Right Eye Near:   Left Eye Near:    Bilateral Near:     Physical Exam Vitals and nursing note reviewed.  Constitutional:      General: She is not in acute distress.    Appearance: She is not ill-appearing or diaphoretic.  Cardiovascular:     Rate and Rhythm: Normal rate and regular rhythm.     Pulses: Normal pulses.     Heart sounds: Normal heart sounds.  Pulmonary:     Effort: Pulmonary effort is normal.     Breath sounds: Normal breath sounds.  Abdominal:     General: Bowel  sounds are normal.     Palpations: Abdomen is soft.  Musculoskeletal:        General: Swelling present. No tenderness. Normal range of motion.  Skin:    General: Skin is warm.  Neurological:     Mental Status: She is alert.      UC Treatments / Results  Labs (all labs ordered are listed, but only abnormal results are displayed) Labs Reviewed - No data to display  EKG   Radiology No results found.  Procedures Procedures (including critical care time)  Medications Ordered in UC Medications - No data to display  Initial Impression / Assessment and Plan / UC Course  I have reviewed the triage vital signs and the nursing notes.  Pertinent labs & imaging results that were available during my care of the patient were reviewed by me and considered in my medical decision making (see chart for details).     1.  Hypertension: Blood pressure is well-controlled at this time. Patient is advised to follow-up with her provider to have an echocardiogram done.  Echocardiogram will help evaluate for left ventricular function as well as pulmonary hypertension.  That will help with management of the patient's condition.  Patient agrees to follow-up with her provider at the [place]. Final Clinical Impressions(s) / UC Diagnoses   Final diagnoses:  Essential hypertension     Discharge Instructions      Your blood pressure is normal I will recommend that you have an echocardiogram done to assess if you have pulmonary hypertension or congestive heart failure.  If you have pulmonary hypertension that may explain why you have lower  extremity swelling and will help with management decisions.   ED Prescriptions   None    PDMP not reviewed this encounter.   Chase Picket, MD 06/17/22 [phone]

## 2022-06-17 NOTE — Discharge Instructions (Signed)
Your blood pressure is normal I will recommend that you have an echocardiogram done to assess if you have pulmonary hypertension or congestive heart failure.  If you have pulmonary hypertension that may explain why you have lower extremity swelling and will help with management decisions.

## 2022-06-21 ENCOUNTER — Encounter: Payer: Self-pay | Admitting: Nurse Practitioner

## 2022-07-12 ENCOUNTER — Encounter: Payer: Self-pay | Admitting: Nurse Practitioner

## 2022-07-12 ENCOUNTER — Other Ambulatory Visit (INDEPENDENT_AMBULATORY_CARE_PROVIDER_SITE_OTHER): Payer: No Typology Code available for payment source

## 2022-07-12 ENCOUNTER — Ambulatory Visit (INDEPENDENT_AMBULATORY_CARE_PROVIDER_SITE_OTHER): Payer: No Typology Code available for payment source | Admitting: Nurse Practitioner

## 2022-07-12 VITALS — BP 120/70 | HR 76 | Ht 64.0 in | Wt 226.0 lb

## 2022-07-12 DIAGNOSIS — D649 Anemia, unspecified: Secondary | ICD-10-CM

## 2022-07-12 DIAGNOSIS — K219 Gastro-esophageal reflux disease without esophagitis: Secondary | ICD-10-CM

## 2022-07-12 LAB — CBC WITH DIFFERENTIAL/PLATELET
Basophils Absolute: 0.1 10*3/uL (ref 0.0–0.1)
Basophils Relative: 1.6 % (ref 0.0–3.0)
Eosinophils Absolute: 0.1 10*3/uL (ref 0.0–0.7)
Eosinophils Relative: 1.7 % (ref 0.0–5.0)
HCT: 33.9 % — ABNORMAL LOW (ref 36.0–46.0)
Hemoglobin: 10 g/dL — ABNORMAL LOW (ref 12.0–15.0)
Lymphocytes Relative: 12.7 % (ref 12.0–46.0)
Lymphs Abs: 1 10*3/uL (ref 0.7–4.0)
MCHC: 29.5 g/dL — ABNORMAL LOW (ref 30.0–36.0)
MCV: 79.6 fl (ref 78.0–100.0)
Monocytes Absolute: 0.5 10*3/uL (ref 0.1–1.0)
Monocytes Relative: 5.5 % (ref 3.0–12.0)
Neutro Abs: 6.4 10*3/uL (ref 1.4–7.7)
Neutrophils Relative %: 78.5 % — ABNORMAL HIGH (ref 43.0–77.0)
Platelets: 441 10*3/uL — ABNORMAL HIGH (ref 150.0–400.0)
RBC: 4.26 Mil/uL (ref 3.87–5.11)
RDW: 23.8 % — ABNORMAL HIGH (ref 11.5–15.5)
WBC: 8.2 10*3/uL (ref 4.0–10.5)

## 2022-07-12 LAB — IBC + FERRITIN
Ferritin: 11 ng/mL (ref 10.0–291.0)
Iron: 20 ug/dL — ABNORMAL LOW (ref 42–145)
Saturation Ratios: 3.8 % — ABNORMAL LOW (ref 20.0–50.0)
TIBC: 530.6 ug/dL — ABNORMAL HIGH (ref 250.0–450.0)
Transferrin: 379 mg/dL — ABNORMAL HIGH (ref 212.0–360.0)

## 2022-07-12 LAB — COMPREHENSIVE METABOLIC PANEL
ALT: 9 U/L (ref 0–35)
AST: 14 U/L (ref 0–37)
Albumin: 4.1 g/dL (ref 3.5–5.2)
Alkaline Phosphatase: 70 U/L (ref 39–117)
BUN: 8 mg/dL (ref 6–23)
CO2: 30 mEq/L (ref 19–32)
Calcium: 10.1 mg/dL (ref 8.4–10.5)
Chloride: 105 mEq/L (ref 96–112)
Creatinine, Ser: 0.62 mg/dL (ref 0.40–1.20)
GFR: 96.37 mL/min (ref >60.00)
Glucose, Bld: 83 mg/dL (ref 70–99)
Potassium: 4.2 mEq/L (ref 3.5–5.1)
Sodium: 142 mEq/L (ref 135–145)
Total Bilirubin: 0.2 mg/dL (ref 0.2–1.2)
Total Protein: 7.6 g/dL (ref 6.0–8.3)

## 2022-07-12 NOTE — Progress Notes (Signed)
07/12/2022 Susan Farley [id number] January 01, 1961   CHIEF COMPLAINT: Anemia  HISTORY OF PRESENT ILLNESS: Susan Farley is a 62 year old female with a past medical history of depression, asthma, hypertension, hypercholesterolemia, carotid artery disease, CVA/TIA 2015, diabetes mellitus type 2, retinitis pigmentosa, sleep apnea, non-small cell lung cancer (squamous cell carcinoma) s/p left VATS/resection 2018, right sided breast cancer treated with neoadjuvant chemotherapy s/p bilateral mastectomy then radiation therapy 2020, hepatic steatosis, GERD and suspected infectious colitis 12/08/2011.   She was seen by Dr. Hilarie Fredrickson in office 07/25/2012 following an ER evaluation for suspected mild pancreatitis.  At that time, she was also noted to have a history of infectious colitis 11/2011 which resolved.  A screening colonoscopy was recommended at that time which was not done. She has not been seen in our office since then.  She presents to our office today as referred by her Carole Civil NP at the [place] in [facility] for further evaluation regarding anemia. She underwent routine laboratory studies Oct. 2023 which showed a hemoglobin level of 5.6 ( Hg 11.4 in 2019). She was sent to the [facility] emergency room and she was transfused one unit of PRBCs. Prior to receiving a blood transfusion, she noted feeling fatigued for 2 to 3 months.  No associated chest pain, shortness of breath or dizziness.  Her energy level has improved since she received a blood transfusion.  Laboratory studies 04/20/2022 showed a WBC count of 10.27.  Hemoglobin 7.1.  Hematocrit 27.3.  MCV 80.8.  Platelet 413.  She started taking oral iron as prescribed by her PCP 1-1/2 months ago. She denies having any nausea or vomiting.  She has a history of GERD for which she takes Pantoprazole 80mg  QD. If she skips 2 to 3 days of Pantoprazole she develops heartburn.  No dysphagia.  No upper abdominal pain.  She underwent an EGD at the [place]  approximately 8 years ago which she reported showed a hiatal hernia otherwise was normal.  No lower abdominal pain.  She passes a normal formed brown bowel movement daily.  No rectal bleeding or black stools.  She sometimes feels her right abdomen bulges out more than her left.  She underwent a colonoscopy by a GI possibly in [place] 1 year ago which she reported was normal.  I am not able to access her colonoscopy records in care everywhere or epic.  No known family history of esophageal, gastric or colorectal cancer.  Labs 04/20/2022: WBC 10.27.  Hemoglobin 7.1.  Hematocrit 27.3.  MCV 80.8.  Platelet 413.   Iron 23.  Iron saturation 4.6.  Ferritin 11.  TIBC 374.  Labs 04/05/2022: Iron 22.  Iron saturation 4.1.  Ferritin 4.7.  TIBC 536 sodium 142.  TSH 2.0 10.  Labs 04/04/2022: WBC 10.68.  Hemoglobin 5.6.  Hematocrit 21.5.  MCV 74.1.  Platelet 379.  BUN 15.  Creatinine 0.8.  Sodium 142.  Potassium 3.8.  Total bili 0.2.  Alk phos 96.  AST 14.  ALT 15.  Vitamin B12 424.   She has mild edema on exam and a heart murmur.  She stated she is scheduled for an ECHO tomorrow as arranged by her PCP.  Past Medical History:  Diagnosis Date   Asthma    Colitis    Depression    Diabetes mellitus without complication (HCC)    GERD (gastroesophageal reflux disease)    Heart murmur    Hypercholesteremia    Hypertension    Retinitis pigmentosa  Sleep apnea    doesnt wear BIPAP currently   Stroke [facility]) Nov. 2015   no residual   Past Surgical History:  Procedure Laterality Date   BONE GRAFT HIP ILIAC CREST     CATARACT EXTRACTION     IUD REMOVAL     LOBECTOMY Left 02/24/2017   Procedure: Left upper lobe LOBECTOMY;  Surgeon: Loreli Slot, MD;  Location: [facility] OR;  Service: Thoracic;  Laterality: Left;   NODE DISSECTION Left 02/24/2017   Procedure: NODE DISSECTION;  Surgeon: Loreli Slot, MD;  Location: [facility] OR;  Service: Thoracic;  Laterality: Left;   OOPHORECTOMY Right 2004   removal  of cyst and ovary   RETINAL CRYOABLATION Bilateral    patient unsure of whether she had this   VIDEO ASSISTED THORACOSCOPY (VATS)/WEDGE RESECTION Left 02/24/2017   Procedure: Left VIDEO ASSISTED THORACOSCOPY (VATS)/WEDGE RESECTION;  Surgeon: Loreli Slot, MD;  Location: [facility] OR;  Service: Thoracic;  Laterality: Left;   Social History: She is single.  Retired.  She smokes 1 to 1 1/2 pack of cigarettes daily x 40 years. She drinks one beer every two months. No drug use.   Family History: Patient was adopted. Biological paternal aunt had breast cancer.  Maternal family history unknown.  Allergies  Allergen Reactions   Shellfish Allergy Anaphylaxis   Hydrochlorothiazide     Other reaction(s): Other (See Comments)   Lansoprazole     Other reaction(s): Other (See Comments)   Metformin Nausea Only and Diarrhea    Other reaction(s): GI Upset (intolerance)   Omeprazole     Other reaction(s): Other (See Comments)   Petrolatum-Zinc Oxide     Other reaction(s): Other (See Comments)   Pravastatin     Other reaction(s): Other (See Comments)   Simvastatin     Other reaction(s): MYALGIA, Myalgias (intolerance)   Tomato     Other reaction(s): Other (See Comments)   Bupropion Hives and Rash      Outpatient Encounter Medications as of 07/12/2022  Medication Sig   acetaminophen (TYLENOL) 500 MG tablet Take 2 tablets (1,000 mg total) by mouth every 6 (six) hours as needed.   amLODipine (NORVASC) 10 MG tablet Take 1 tablet (10 mg total) by mouth daily.   anastrozole (ARIMIDEX) 1 MG tablet    aspirin EC 325 MG EC tablet Take 1 tablet (325 mg total) by mouth daily.   cetirizine (ZYRTEC ALLERGY) 10 MG tablet Take 1 tablet (10 mg total) by mouth daily.   [employer] F[employer] GUMMIES PO Take 2 capsules by mouth daily.   dexamethasone (OZURDEX) 0.7 MG IMPL 0.7 mg by Intravitreal route every 3 (three) months.    docusate sodium (COLACE) 100 MG capsule Take 200 mg by mouth at bedtime.   dorzolamide (TRUSOPT) 2  % ophthalmic solution Place 1 drop into both eyes 2 (two) times daily.   EPINEPHrine 0.3 mg/0.3 mL IJ SOAJ injection    fluticasone (FLONASE) 50 MCG/ACT nasal spray    hydrochlorothiazide (HYDRODIURIL) 25 MG tablet Take 25 mg by mouth daily.   lisinopril (PRINIVIL,ZESTRIL) 5 MG tablet Take 1 tablet (5 mg total) by mouth daily.   nystatin (MYCOSTATIN) 100000 UNIT/ML suspension    ondansetron (ZOFRAN) 8 MG tablet    pantoprazole (PROTONIX) 40 MG tablet Take 80 mg by mouth daily.    potassium chloride SA (K-DUR,KLOR-CON) 20 MEQ tablet Take 1 tablet (20 mEq total) by mouth daily.   QUEtiapine (SEROQUEL) 25 MG tablet Take 25 mg by mouth at bedtime.  rosuvastatin (CRESTOR) 20 MG tablet Take 20 mg by mouth every evening.   timolol (BETIMOL) 0.5 % ophthalmic solution Place 1 drop into both eyes 2 (two) times daily.   timolol (TIMOPTIC) 0.5 % ophthalmic solution AS DIRECTED INSTILL 1 DROP IN BOTH EYES TWICE A DAY   venlafaxine XR (EFFEXOR-XR) 37.5 MG 24 hr capsule Take 187.5 mg by mouth at bedtime.   VICTOZA 18 MG/3ML SOPN Inject into the skin.   No facility-administered encounter medications on file as of 07/12/2022.   REVIEW OF SYSTEMS:  Gen: Denies fever, sweats or chills. No weight loss.  CV: Denies chest pain, palpitations or edema. Resp: Denies cough, shortness of breath of hemoptysis.  GI: See HPI.  GU : Denies urinary burning, blood in urine, increased urinary frequency or incontinence. MS: Denies joint pain, muscles aches or weakness. Derm: Denies rash, itchiness, skin lesions or unhealing ulcers. Psych: + Anxiety and depression.  Heme: Denies bruising, easy bleeding. Neuro:  Denies headaches, dizziness or paresthesias. Endo: + DM II.   PHYSICAL EXAM: BP 120/70   Pulse 76   Ht 5\' 4"  (1.626 m)   Wt 226 lb (102.5 kg)   LMP 09/27/2011   BMI 38.79 kg/m   General: 62 year old female in no acute distress. Head: Normocephalic and atraumatic. Eyes:  Sclerae non-icteric, conjunctive  pink. Ears: Normal auditory acuity. Mouth: Upper dentures.  No ulcers or lesions.  Neck: Supple, no lymphadenopathy or thyromegaly.  Lungs: Clear bilaterally to auscultation without wheezes, crackles or rhonchi. Heart: Regular rate and rhythm. Systolic murmur, rub or gallop appreciated.  Abdomen: Soft, obese abdomen. Nontender, nondistended.  No obvious abdominal hernia.  No masses. No hepatosplenomegaly. Normoactive bowel sounds x 4 quadrants.  Rectal: Deferred.  Musculoskeletal: Symmetrical with no gross deformities. Skin: Warm and dry. No rash or lesions on visible extremities. Extremities: Bilateral LE edema.  Neurological: Alert oriented x 4, no focal deficits.  Psychological:  Alert and cooperative. Normal mood and affect.  ASSESSMENT AND PLAN:  12) 62 year old female with iron deficiency anemia.  No overt GI bleeding.  Hemoglobin 5.25 March 2022 with associated fatigue.  Transfused 1 unit of PRBCs. Repeat Hg 7.1 on 04/20/2022. Oral iron started 1 1/2 months ago per PCP.  History of an EGD approximately 8 years ago which possibly showed a hiatal hernia.  Colonoscopy done 1 year ago reported as normal per the patient. -CBC, IBC and ferritin level today -Request copy of colonoscopy report done one year ago, possibly by a GI in [place].  Review colonoscopy records prior to patient proceeding with an EGD. -EGD benefits and risks discussed including risk with sedation, risk of bleeding, perforation and infection  -Patient to contact me with name and dose of iron supplement -Consider IV iron if repeat Hg and iron levels remain low  2) History of GERD, on Pantoprazole 40mg  two tabs po QD -Recommend taking Pantoprazole 40 mg twice daily to be taken 30 minutes before breakfast and dinner -EGD as noted above   3) History of lung and breast cancer   4) Heart murmur with LE edema. No CP or SOB. ECHO scheduled at the [facility] 07/13/2022. Request copy of echo from the [facility] prior to patient proceeding  with endoscopic evaluation  Further recommendations to be determined after the above evaluation completed       CC:  Center, 07/15/2022*

## 2022-07-12 NOTE — Patient Instructions (Signed)
You have been scheduled for an endoscopy. Please follow written instructions given to you at your visit today. If you use inhalers (even only as needed), please bring them with you on the day of your procedure.  Your provider has requested that you go to the basement level for lab work before leaving today. Press "B" on the elevator. The lab is located at the first door on the left as you exit the elevator.  Recommend Pantoprazole 40 mg- 1 by mouth twice daily to be taken 30 minutes before breakfast and dinner  Due to recent changes in healthcare laws, you may see the results of your imaging and laboratory studies on MyChart before your provider has had a chance to review them.  We understand that in some cases there may be results that are confusing or concerning to you. Not all laboratory results come back in the same time frame and the provider may be waiting for multiple results in order to interpret others.  Please give [place] 48 hours in order for your provider to thoroughly review all the results before contacting the office for clarification of your results.   Thank you for trusting me with your gastrointestinal care!   Alcide Evener, CRNP

## 2022-07-13 NOTE — Progress Notes (Signed)
Addendum: Reviewed and agree with assessment and management plan. Agree with further endoscopic workup and checking timing and quality of last colonoscopy given IDA Mahrukh Seguin, Lajuan Lines, MD

## 2022-07-20 ENCOUNTER — Other Ambulatory Visit (INDEPENDENT_AMBULATORY_CARE_PROVIDER_SITE_OTHER): Payer: No Typology Code available for payment source

## 2022-07-20 ENCOUNTER — Telehealth: Payer: Self-pay | Admitting: Internal Medicine

## 2022-07-20 ENCOUNTER — Other Ambulatory Visit: Payer: Self-pay

## 2022-07-20 DIAGNOSIS — D649 Anemia, unspecified: Secondary | ICD-10-CM

## 2022-07-20 LAB — CBC WITH DIFFERENTIAL/PLATELET
Basophils Absolute: 0.1 10*3/uL (ref 0.0–0.1)
Basophils Relative: 0.9 % (ref 0.0–3.0)
Eosinophils Absolute: 0.1 10*3/uL (ref 0.0–0.7)
Eosinophils Relative: 1.5 % (ref 0.0–5.0)
HCT: 35.3 % — ABNORMAL LOW (ref 36.0–46.0)
Hemoglobin: 10.7 g/dL — ABNORMAL LOW (ref 12.0–15.0)
Lymphocytes Relative: 14.5 % (ref 12.0–46.0)
Lymphs Abs: 1.2 10*3/uL (ref 0.7–4.0)
MCHC: 30.5 g/dL (ref 30.0–36.0)
MCV: 79.1 fl (ref 78.0–100.0)
Monocytes Absolute: 0.5 10*3/uL (ref 0.1–1.0)
Monocytes Relative: 6.5 % (ref 3.0–12.0)
Neutro Abs: 6.4 10*3/uL (ref 1.4–7.7)
Neutrophils Relative %: 76.6 % (ref 43.0–77.0)
Platelets: 368 10*3/uL (ref 150.0–400.0)
RBC: 4.46 Mil/uL (ref 3.87–5.11)
RDW: 23 % — ABNORMAL HIGH (ref 11.5–15.5)
WBC: 8.4 10*3/uL (ref 4.0–10.5)

## 2022-07-20 NOTE — Telephone Encounter (Signed)
Scheduled appt per 1/31 referral. Pt is aware of appt date and time. Pt is aware to arrive 15 mins prior to appt time and to bring and updated insurance card. Pt is aware of appt location.

## 2022-08-01 ENCOUNTER — Other Ambulatory Visit: Payer: Self-pay | Admitting: Internal Medicine

## 2022-08-01 DIAGNOSIS — D539 Nutritional anemia, unspecified: Secondary | ICD-10-CM

## 2022-08-02 ENCOUNTER — Inpatient Hospital Stay: Payer: No Typology Code available for payment source | Attending: Internal Medicine | Admitting: Internal Medicine

## 2022-08-02 ENCOUNTER — Other Ambulatory Visit: Payer: Self-pay

## 2022-08-02 ENCOUNTER — Inpatient Hospital Stay: Payer: No Typology Code available for payment source

## 2022-08-02 ENCOUNTER — Telehealth: Payer: Self-pay | Admitting: Nurse Practitioner

## 2022-08-02 VITALS — BP 108/56 | HR 91 | Temp 100.6°F | Resp 14 | Wt 224.3 lb

## 2022-08-02 DIAGNOSIS — Z85118 Personal history of other malignant neoplasm of bronchus and lung: Secondary | ICD-10-CM

## 2022-08-02 DIAGNOSIS — F17210 Nicotine dependence, cigarettes, uncomplicated: Secondary | ICD-10-CM | POA: Diagnosis not present

## 2022-08-02 DIAGNOSIS — D539 Nutritional anemia, unspecified: Secondary | ICD-10-CM

## 2022-08-02 DIAGNOSIS — Z7985 Long-term (current) use of injectable non-insulin antidiabetic drugs: Secondary | ICD-10-CM | POA: Insufficient documentation

## 2022-08-02 DIAGNOSIS — Z79899 Other long term (current) drug therapy: Secondary | ICD-10-CM | POA: Diagnosis not present

## 2022-08-02 DIAGNOSIS — I10 Essential (primary) hypertension: Secondary | ICD-10-CM | POA: Insufficient documentation

## 2022-08-02 DIAGNOSIS — E119 Type 2 diabetes mellitus without complications: Secondary | ICD-10-CM | POA: Insufficient documentation

## 2022-08-02 DIAGNOSIS — D509 Iron deficiency anemia, unspecified: Secondary | ICD-10-CM | POA: Diagnosis present

## 2022-08-02 DIAGNOSIS — Z7982 Long term (current) use of aspirin: Secondary | ICD-10-CM | POA: Insufficient documentation

## 2022-08-02 DIAGNOSIS — Z853 Personal history of malignant neoplasm of breast: Secondary | ICD-10-CM | POA: Diagnosis not present

## 2022-08-02 DIAGNOSIS — D500 Iron deficiency anemia secondary to blood loss (chronic): Secondary | ICD-10-CM | POA: Insufficient documentation

## 2022-08-02 DIAGNOSIS — Z803 Family history of malignant neoplasm of breast: Secondary | ICD-10-CM

## 2022-08-02 LAB — CMP (CANCER CENTER ONLY)
ALT: 10 U/L (ref 0–44)
AST: 16 U/L (ref 15–41)
Albumin: 3.9 g/dL (ref 3.5–5.0)
Alkaline Phosphatase: 72 U/L (ref 38–126)
Anion gap: 5 (ref 5–15)
BUN: 13 mg/dL (ref 8–23)
CO2: 32 mmol/L (ref 22–32)
Calcium: 9.8 mg/dL (ref 8.9–10.3)
Chloride: 104 mmol/L (ref 98–111)
Creatinine: 0.63 mg/dL (ref 0.44–1.00)
GFR, Estimated: >60 mL/min (ref >60)
Glucose, Bld: 94 mg/dL (ref 70–99)
Potassium: 3.7 mmol/L (ref 3.5–5.1)
Sodium: 141 mmol/L (ref 135–145)
Total Bilirubin: 0.2 mg/dL — ABNORMAL LOW (ref 0.3–1.2)
Total Protein: 7.3 g/dL (ref 6.5–8.1)

## 2022-08-02 LAB — CBC WITH DIFFERENTIAL (CANCER CENTER ONLY)
Abs Immature Granulocytes: 0.03 10*3/uL (ref 0.00–0.07)
Basophils Absolute: 0 10*3/uL (ref 0.0–0.1)
Basophils Relative: 1 %
Eosinophils Absolute: 0.1 10*3/uL (ref 0.0–0.5)
Eosinophils Relative: 1 %
HCT: 37.4 % (ref 36.0–46.0)
Hemoglobin: 10.7 g/dL — ABNORMAL LOW (ref 12.0–15.0)
Immature Granulocytes: 0 %
Lymphocytes Relative: 12 %
Lymphs Abs: 1 10*3/uL (ref 0.7–4.0)
MCH: 24.3 pg — ABNORMAL LOW (ref 26.0–34.0)
MCHC: 28.6 g/dL — ABNORMAL LOW (ref 30.0–36.0)
MCV: 84.8 fL (ref 80.0–100.0)
Monocytes Absolute: 0.4 10*3/uL (ref 0.1–1.0)
Monocytes Relative: 5 %
Neutro Abs: 6.7 10*3/uL (ref 1.7–7.7)
Neutrophils Relative %: 81 %
Platelet Count: 362 10*3/uL (ref 150–400)
RBC: 4.41 MIL/uL (ref 3.87–5.11)
RDW: 21.2 % — ABNORMAL HIGH (ref 11.5–15.5)
WBC Count: 8.2 10*3/uL (ref 4.0–10.5)
nRBC: 0 % (ref 0.0–0.2)

## 2022-08-02 LAB — IRON AND IRON BINDING CAPACITY (CC-WL,HP ONLY)
Iron: 26 ug/dL — ABNORMAL LOW (ref 28–170)
Saturation Ratios: 6 % — ABNORMAL LOW (ref 10.4–31.8)
TIBC: 470 ug/dL — ABNORMAL HIGH (ref 250–450)
UIBC: 444 ug/dL — ABNORMAL HIGH (ref 148–442)

## 2022-08-02 LAB — FOLATE: Folate: 8.4 ng/mL (ref >5.9)

## 2022-08-02 LAB — VITAMIN B12: Vitamin B-12: 333 pg/mL (ref 180–914)

## 2022-08-02 LAB — FERRITIN: Ferritin: 13 ng/mL (ref 11–307)

## 2022-08-02 NOTE — Progress Notes (Signed)
[place] [phone]   [phone]  CONSULT NOTE  REFERRING PHYSICIAN: Dr. Ulice Dash Pyrtle  REASON FOR CONSULTATION:  62 years old African-American female with persistent anemia.  HPI Susan Farley is a 62 y.o. female with past medical history significant for asthma, colitis, depression, diabetes mellitus, GERD, hypertension, dyslipidemia, retinitis pigmentosa, sleep apnea as well as history for stroke in November 2015.  The patient also has a history of stage IA (T1c, N0, M0) non-small cell lung cancer, squamous cell carcinoma status post left upper lobectomy with lymph node dissection under the care of Dr. Roxan Hockey on 02/24/2017.  The patient also has a history of right-sided breast cancer status post neoadjuvant systemic chemotherapy followed by bilateral mastectomy and radiation therapy in 2020 under the care of Dr. Lisbeth Renshaw.  She also has a history of hepatic steatosis.  She is followed by gastroenterology for history of pancreatitis that was diagnosed in 2014.  The patient was seen by her primary care provider at the [facility] facility in [place] with persistent anemia.  She was referred to gastroenterology for further evaluation.  In October 2023 her hemoglobin was down to 5.6.  She received 1 unit of PRBCs transfusion at that time the patient was referred to Dr. Hilarie Fredrickson for reevaluation.  She had iron study on July 12, 2022 that showed serum iron of 20 with iron saturation of 3.8% and ferritin level of 11.  Her TIBC was 530.6.  Her CBC at that time showed hemoglobin of 10.0 and hematocrit 33.9% with elevated platelets count of 441,000.  Because of her persistent iron deficiency anemia, she was referred to me today for evaluation and recommendation regarding her condition. When seen to date the patient continues to complain of increased fatigue and lack of energy.  She denied having any dizzy spells.  She continues to have craving for ice.  She has no chest pain,  shortness of breath, cough or hemoptysis.  She has no nausea, vomiting, diarrhea or constipation.  She has no headache or visual changes.  She has no recent weight loss or night sweats. Family history she is adopted but her father had COPD and several paternal aunts with breast cancer. The patient is single and has no children.  She used to work in [employer] and currently retired.  She has history of smoking but no alcohol or drug abuse.   HPI  Past Medical History:  Diagnosis Date   Asthma    Colitis    Depression    Diabetes mellitus without complication ([place])    GERD (gastroesophageal reflux disease)    Heart murmur    Hypercholesteremia    Hypertension    Retinitis pigmentosa    Sleep apnea    doesnt wear BIPAP currently   Stroke [facility]) Nov. 2015   no residual    Past Surgical History:  Procedure Laterality Date   BONE GRAFT HIP ILIAC CREST     CATARACT EXTRACTION     IUD REMOVAL     LOBECTOMY Left 02/24/2017   Procedure: Left upper lobe LOBECTOMY;  Surgeon: Melrose Nakayama, MD;  Location: [place];  Service: Thoracic;  Laterality: Left;   NODE DISSECTION Left 02/24/2017   Procedure: NODE DISSECTION;  Surgeon: Melrose Nakayama, MD;  Location: [place];  Service: Thoracic;  Laterality: Left;   OOPHORECTOMY Right 2004   removal of cyst and ovary   RETINAL CRYOABLATION Bilateral    patient unsure of whether she had this  VIDEO ASSISTED THORACOSCOPY (VATS)/WEDGE RESECTION Left 02/24/2017   Procedure: Left VIDEO ASSISTED THORACOSCOPY (VATS)/WEDGE RESECTION;  Surgeon: Melrose Nakayama, MD;  Location: [place];  Service: Thoracic;  Laterality: Left;    Family History  Adopted: Yes  Problem Relation Age of Onset   Diabetes Other        BIOLOGICAL AUNT   Heart disease Other        BIOLOGICAL AUNT    Social History Social History   Tobacco Use   Smoking status: Every Day    Packs/day: 2.00    Years: 40.00    Total pack years: 80.00    Types: Cigarettes     Last attempt to quit: 02/28/2017    Years since quitting: 5.4   Smokeless tobacco: Never   Tobacco comments:    2pk per day   Vaping Use   Vaping Use: Former  Substance Use Topics   Alcohol use: Not Currently   Drug use: No    Allergies  Allergen Reactions   Shellfish Allergy Anaphylaxis   Hydrochlorothiazide     Other reaction(s): Other (See Comments)   Lansoprazole     Other reaction(s): Other (See Comments)   Metformin Nausea Only and Diarrhea    Other reaction(s): GI Upset (intolerance)   Omeprazole     Other reaction(s): Other (See Comments)   Petrolatum-Zinc Oxide     Other reaction(s): Other (See Comments)   Pravastatin     Other reaction(s): Other (See Comments)   Simvastatin     Other reaction(s): MYALGIA, Myalgias (intolerance)   Tomato     Other reaction(s): Other (See Comments)   Bupropion Hives and Rash    Current Outpatient Medications  Medication Sig Dispense Refill   acetaminophen (TYLENOL) 500 MG tablet Take 2 tablets (1,000 mg total) by mouth every 6 (six) hours as needed. 30 tablet 0   amLODipine (NORVASC) 10 MG tablet Take 1 tablet (10 mg total) by mouth daily. 30 tablet 3   anastrozole (ARIMIDEX) 1 MG tablet      aspirin EC 325 MG EC tablet Take 1 tablet (325 mg total) by mouth daily. 30 tablet 0   cetirizine (ZYRTEC ALLERGY) 10 MG tablet Take 1 tablet (10 mg total) by mouth daily. 30 tablet 0   [employer] FIBER GUMMIES PO Take 2 capsules by mouth daily.     dexamethasone (OZURDEX) 0.7 MG IMPL 0.7 mg by Intravitreal route every 3 (three) months.      docusate sodium (COLACE) 100 MG capsule Take 200 mg by mouth at bedtime.     dorzolamide (TRUSOPT) 2 % ophthalmic solution Place 1 drop into both eyes 2 (two) times daily.     EPINEPHrine 0.3 mg/0.3 mL IJ SOAJ injection      fluticasone (FLONASE) 50 MCG/ACT nasal spray      hydrochlorothiazide (HYDRODIURIL) 25 MG tablet Take 25 mg by mouth daily.     lisinopril (PRINIVIL,ZESTRIL) 5 MG tablet Take 1 tablet (5  mg total) by mouth daily. 30 tablet 3   nystatin (MYCOSTATIN) 100000 UNIT/ML suspension      ondansetron (ZOFRAN) 8 MG tablet      pantoprazole (PROTONIX) 40 MG tablet Take 80 mg by mouth daily.      potassium chloride SA (K-DUR,KLOR-CON) 20 MEQ tablet Take 1 tablet (20 mEq total) by mouth daily. 30 tablet 3   QUEtiapine (SEROQUEL) 25 MG tablet Take 25 mg by mouth at bedtime.     rosuvastatin (CRESTOR) 20 MG tablet Take 20 mg  by mouth every evening.     timolol (BETIMOL) 0.5 % ophthalmic solution Place 1 drop into both eyes 2 (two) times daily.     timolol (TIMOPTIC) 0.5 % ophthalmic solution AS DIRECTED INSTILL 1 DROP IN BOTH EYES TWICE A DAY  2   venlafaxine XR (EFFEXOR-XR) 37.5 MG 24 hr capsule Take 187.5 mg by mouth at bedtime.     VICTOZA 18 MG/3ML SOPN Inject into the skin.     No current facility-administered medications for this visit.    Review of Systems  Constitutional: positive for fatigue Eyes: negative Ears, nose, mouth, throat, and face: negative Respiratory: negative Cardiovascular: negative Gastrointestinal: negative Genitourinary:negative Integument/breast: negative Hematologic/lymphatic: negative Musculoskeletal:negative Neurological: negative Behavioral/Psych: negative Endocrine: negative Allergic/Immunologic: negative  Physical Exam  DVV:OHYWV, healthy, no distress, well nourished, and well developed SKIN: skin color, texture, turgor are normal, no rashes or significant lesions HEAD: Normocephalic, No masses, lesions, tenderness or abnormalities EYES: normal, PERRLA, Conjunctiva are pink and non-injected EARS: External ears normal, Canals clear OROPHARYNX:no exudate, no erythema, and lips, buccal mucosa, and tongue normal  NECK: supple, no adenopathy, no JVD LYMPH:  no palpable lymphadenopathy, no hepatosplenomegaly BREAST:not examined LUNGS: clear to auscultation , and palpation HEART: regular rate & rhythm, no murmurs, and no gallops ABDOMEN:abdomen  soft, non-tender, normal bowel sounds, and no masses or organomegaly BACK: Back symmetric, no curvature., No CVA tenderness EXTREMITIES:no joint deformities, effusion, or inflammation, no edema  NEURO: alert & oriented x 3 with fluent speech, no focal motor/sensory deficits  PERFORMANCE STATUS: ECOG 1  LABORATORY DATA: Lab Results  Component Value Date   WBC 8.2 08/02/2022   HGB 10.7 (L) 08/02/2022   HCT 37.4 08/02/2022   MCV 84.8 08/02/2022   PLT 362 08/02/2022      Chemistry      Component Value Date/Time   NA 142 07/12/2022 1131   NA 141 04/11/2017 1044   K 4.2 07/12/2022 1131   K 3.7 04/11/2017 1044   CL 105 07/12/2022 1131   CO2 30 07/12/2022 1131   CO2 29 04/11/2017 1044   BUN 8 07/12/2022 1131   BUN 11.4 04/11/2017 1044   CREATININE 0.62 07/12/2022 1131   CREATININE 0.9 04/11/2017 1044      Component Value Date/Time   CALCIUM 10.1 07/12/2022 1131   CALCIUM 10.3 04/11/2017 1044   ALKPHOS 70 07/12/2022 1131   ALKPHOS 87 04/11/2017 1044   AST 14 07/12/2022 1131   AST 21 04/11/2017 1044   ALT 9 07/12/2022 1131   ALT 16 04/11/2017 1044   BILITOT 0.2 07/12/2022 1131   BILITOT 0.30 04/11/2017 1044       RADIOGRAPHIC STUDIES: No results found.  ASSESSMENT: This is a very pleasant 62 years old African-American female presented for evaluation of persistent iron deficiency anemia likely secondary to gastrointestinal blood loss.  The patient did not have any recent colonoscopy and probably more than 10 years ago since the last one.   PLAN: I had a lengthy discussion with the patient today about her current condition and treatment options. I ordered several studies today for evaluation of her anemia including repeat CBC that showed hemoglobin of 10.7 and hematocrit 37.4.  Iron studies showed low serum iron of 26 and iron saturation of 6% with TIBC of 470.  The patient has normal serum folate and vitamin B12 level.  Ferritin level and blood for heavy metals are still  pending. I recommended for the patient to proceed with iron infusion with Venofer 300 Mg  IV weekly for 3 weeks. I also advised the patient to continue with the oral iron tablet and vitamin C. I will see her back for follow-up visit in 3 months for evaluation and repeat blood work. The patient was advised to continue her routine follow-up visit and evaluation by gastroenterology for consideration of repeat colonoscopy. She was advised to call immediately if she has any other concerning symptoms in the interval. The patient voices understanding of current disease status and treatment options and is in agreement with the current care plan.  All questions were answered. The patient knows to call the clinic with any problems, questions or concerns. We can certainly see the patient much sooner if necessary.  Thank you so much for allowing me to participate in the care of Susan Farley. I will continue to follow up the patient with you and assist in her care.  The total time spent in the appointment was 60 minutes.  Disclaimer: This note was dictated with voice recognition software. Similar sounding words can inadvertently be transcribed and may not be corrected upon review.   Eilleen Kempf August 02, 2022, 11:23 AM

## 2022-08-02 NOTE — Telephone Encounter (Signed)
error 

## 2022-08-04 ENCOUNTER — Telehealth: Payer: Self-pay | Admitting: Medical Oncology

## 2022-08-04 ENCOUNTER — Other Ambulatory Visit: Payer: Self-pay

## 2022-08-04 NOTE — Telephone Encounter (Signed)
[facility] needs therapy plan and dose.

## 2022-08-05 ENCOUNTER — Other Ambulatory Visit: Payer: Self-pay | Admitting: Physician Assistant

## 2022-08-05 NOTE — Telephone Encounter (Signed)
Richardson Landry notified that tx plan location was changed and dose is venofer 300 mg x 3 doses not 5. He said he will edit the plan to be 3 doses .

## 2022-08-08 ENCOUNTER — Encounter: Payer: Self-pay | Admitting: Internal Medicine

## 2022-08-08 LAB — HEAVY METALS, BLOOD
Arsenic: 3 ug/L (ref 0–9)
Lead: 1 ug/dL (ref 0.0–3.4)
Mercury: 3.2 ug/L (ref 0.0–14.9)

## 2022-08-09 ENCOUNTER — Ambulatory Visit (INDEPENDENT_AMBULATORY_CARE_PROVIDER_SITE_OTHER): Payer: No Typology Code available for payment source

## 2022-08-09 VITALS — BP 117/72 | HR 76 | Temp 98.0°F | Resp 18 | Ht 64.0 in | Wt 224.4 lb

## 2022-08-09 DIAGNOSIS — K922 Gastrointestinal hemorrhage, unspecified: Secondary | ICD-10-CM | POA: Diagnosis not present

## 2022-08-09 DIAGNOSIS — D500 Iron deficiency anemia secondary to blood loss (chronic): Secondary | ICD-10-CM | POA: Diagnosis not present

## 2022-08-09 MED ORDER — DIPHENHYDRAMINE HCL 25 MG PO CAPS
25.0000 mg | ORAL_CAPSULE | Freq: Once | ORAL | Status: AC
  Administered 2022-08-09: 25 mg via ORAL
  Filled 2022-08-09: qty 1

## 2022-08-09 MED ORDER — ACETAMINOPHEN 325 MG PO TABS
650.0000 mg | ORAL_TABLET | Freq: Once | ORAL | Status: AC
  Administered 2022-08-09: 650 mg via ORAL
  Filled 2022-08-09: qty 2

## 2022-08-09 MED ORDER — SODIUM CHLORIDE 0.9 % IV SOLN
300.0000 mg | INTRAVENOUS | Status: DC
  Administered 2022-08-09: 300 mg via INTRAVENOUS
  Filled 2022-08-09: qty 15

## 2022-08-09 NOTE — Progress Notes (Signed)
Diagnosis: Iron Deficiency Anemia  Provider:  Marshell Garfinkel MD  Procedure: Infusion  IV Type: Peripheral, IV Location: L Antecubital  Venofer (Iron Sucrose), Dose: 300 mg  Infusion Start Time: 7127  Infusion Stop Time: 1225  Post Infusion IV Care: Observation period completed and Peripheral IV Discontinued  Discharge: Condition: Good, Destination: Home . AVS Provided  Performed by:  Arnoldo Morale, RN

## 2022-08-09 NOTE — Patient Instructions (Signed)

## 2022-08-11 ENCOUNTER — Telehealth: Payer: Self-pay | Admitting: Pharmacy Technician

## 2022-08-11 NOTE — Telephone Encounter (Signed)
Auth Submission: NO AUTH NEEDED Payer: [employer] Medication & CPT/J Code(s) submitted: Venofer (Iron Sucrose) J1756 Route of submission (phone, fax, portal):  Phone # Fax # Auth type: Buy/Bill Units/visits requested: 3 Reference number:  Approval from: 08/11/22 to 01/09/23

## 2022-08-15 ENCOUNTER — Encounter: Payer: Self-pay | Admitting: Internal Medicine

## 2022-08-15 ENCOUNTER — Ambulatory Visit (AMBULATORY_SURGERY_CENTER): Payer: No Typology Code available for payment source | Admitting: Internal Medicine

## 2022-08-15 VITALS — BP 132/66 | HR 86 | Temp 97.5°F | Resp 13 | Ht 64.0 in | Wt 226.0 lb

## 2022-08-15 DIAGNOSIS — B379 Candidiasis, unspecified: Secondary | ICD-10-CM

## 2022-08-15 DIAGNOSIS — K219 Gastro-esophageal reflux disease without esophagitis: Secondary | ICD-10-CM

## 2022-08-15 DIAGNOSIS — K209 Esophagitis, unspecified without bleeding: Secondary | ICD-10-CM

## 2022-08-15 DIAGNOSIS — K317 Polyp of stomach and duodenum: Secondary | ICD-10-CM | POA: Diagnosis not present

## 2022-08-15 DIAGNOSIS — K319 Disease of stomach and duodenum, unspecified: Secondary | ICD-10-CM | POA: Diagnosis not present

## 2022-08-15 DIAGNOSIS — K2980 Duodenitis without bleeding: Secondary | ICD-10-CM

## 2022-08-15 DIAGNOSIS — D509 Iron deficiency anemia, unspecified: Secondary | ICD-10-CM

## 2022-08-15 MED ORDER — SODIUM CHLORIDE 0.9 % IV SOLN: 500.0000 mL | Freq: Once | INTRAVENOUS | Status: DC

## 2022-08-15 MED ORDER — FLUCONAZOLE 100 MG PO TABS: ORAL_TABLET | ORAL | 0 refills | Status: DC

## 2022-08-15 NOTE — Op Note (Signed)
[place] Patient Name: Susan Farley Procedure Date: 08/15/2022 3:44 PM MRN: [id number] Endoscopist: Jerene Bears , MD, [id number] Age: 62 Referring MD:  Date of Birth: [birth year] Gender: Female Account #: [id number] Procedure:                Upper GI endoscopy Indications:              Iron deficiency anemia, Gastro-esophageal reflux                            disease Medicines:                Monitored Anesthesia Care Procedure:                Pre-Anesthesia Assessment:                           - Prior to the procedure, a History and Physical                            was performed, and patient medications and                            allergies were reviewed. The patient's tolerance of                            previous anesthesia was also reviewed. The risks                            and benefits of the procedure and the sedation                            options and risks were discussed with the patient.                            All questions were answered, and informed consent                            was obtained. Prior Anticoagulants: The patient has                            taken no anticoagulant or antiplatelet agents. ASA                            Grade Assessment: III - A patient with severe                            systemic disease. After reviewing the risks and                            benefits, the patient was deemed in satisfactory                            condition to undergo the procedure.  After obtaining informed consent, the endoscope was                            passed under direct vision. Throughout the                            procedure, the patient's blood pressure, pulse, and                            oxygen saturations were monitored continuously. The                            GIF [id number] was introduced through the                            mouth, and advanced to the second part of  duodenum.                            The upper GI endoscopy was accomplished without                            difficulty. The patient tolerated the procedure                            well. Scope In: Scope Out: Findings:                 Patchy, yellow plaques were found in the lower                            third of the esophagus.                           Diffuse mildly congested mucosa was found in the                            gastric fundus and in the gastric body.                           The exam of the stomach was otherwise normal.                           A few small sessile polyps with no bleeding were                            found in the duodenal bulb. Query Brunner's gland                            hyperplasia. Biopsies were taken with a cold                            forceps for histology to exclude adenoma.                           The second  portion of the duodenum was normal. Complications:            No immediate complications. Estimated Blood Loss:     Estimated blood loss was minimal. Impression:               - Esophageal plaques were found, consistent with                            candidiasis.                           - Congestive gastropathy without bleeding in the                            proximal stomach.                           - A few duodenal polyps versus Brunner's gland                            hyperplasia. Biopsied to exclude adenoma.                           - Normal second portion of the duodenum. Recommendation:           - Patient has a contact number available for                            emergencies. The signs and symptoms of potential                            delayed complications were discussed with the                            patient. Return to normal activities tomorrow.                            Written discharge instructions were provided to the                            patient.                           -  Resume previous diet.                           - Continue present medications.                           - Fluconazole 200 mg x 1 day, 100 mg x 13 days for                            Candida esophagitis.                           - Await pathology results.                           -  Await results from colonoscopy (done elsewhere).                           - Primary care to monitor Hgb and iron stores.                            Resume iron replacement. Jerene Bears, MD 08/15/2022 4:06:03 PM This report has been signed electronically.

## 2022-08-15 NOTE — Progress Notes (Unsigned)
GASTROENTEROLOGY PROCEDURE H&P NOTE   Primary Care Physician: [facility]    Reason for Procedure:  History of GERD and now iron deficiency anemia  Plan:    Upper endoscopy  Patient is appropriate for endoscopic procedure(s) in the ambulatory ([facility]) setting.  The nature of the procedure, as well as the risks, benefits, and alternatives were carefully and thoroughly reviewed with the patient. Ample time for discussion and questions allowed. The patient understood, was satisfied, and agreed to proceed.     HPI: Susan Farley is a 62 y.o. female who presents for upper endoscopy.  Medical history as below.  Tolerated the prep.  No recent chest pain or shortness of breath.  No abdominal pain today.  Past Medical History:  Diagnosis Date   Allergy    Anemia    Anxiety    Asthma    Cancer ([place])    Cataract    Colitis    Depression    Diabetes mellitus without complication (HCC)    GERD (gastroesophageal reflux disease)    Heart murmur    Hypercholesteremia    Hypertension    Retinitis pigmentosa    Sleep apnea    doesnt wear BIPAP currently   Stroke ([place]) 04/2014   no residual    Past Surgical History:  Procedure Laterality Date   BONE GRAFT HIP ILIAC CREST     CATARACT EXTRACTION     COLONOSCOPY     IUD REMOVAL     LOBECTOMY Left 02/24/2017   Procedure: Left upper lobe LOBECTOMY;  Surgeon: Melrose Nakayama, MD;  Location: [place];  Service: Thoracic;  Laterality: Left;   NODE DISSECTION Left 02/24/2017   Procedure: NODE DISSECTION;  Surgeon: Melrose Nakayama, MD;  Location: [place];  Service: Thoracic;  Laterality: Left;   OOPHORECTOMY Right 2004   removal of cyst and ovary   RETINAL CRYOABLATION Bilateral    patient unsure of whether she had this   [place] (VATS)/WEDGE RESECTION Left 02/24/2017   Procedure: Left VIDEO ASSISTED THORACOSCOPY (VATS)/WEDGE RESECTION;  Surgeon: Melrose Nakayama, MD;  Location: [facility];   Service: Thoracic;  Laterality: Left;    Prior to Admission medications   Medication Sig Start Date End Date Taking? Authorizing Provider  amLODipine (NORVASC) 10 MG tablet Take 1 tablet (10 mg total) by mouth daily. 02/28/17  Yes Barrett, Erin R, PA-C  anastrozole (ARIMIDEX) 1 MG tablet  06/04/18  Yes [provider]  aspirin EC 325 MG EC tablet Take 1 tablet (325 mg total) by mouth daily. 04/25/14  Yes Mikhail, Velta Addison, DO  cetirizine (ZYRTEC ALLERGY) 10 MG tablet Take 1 tablet (10 mg total) by mouth daily. 10/26/19  Yes Jaynee Eagles, PA-C  [employer] FIBER GUMMIES PO Take 2 capsules by mouth daily.   Yes [provider]  dorzolamide (TRUSOPT) 2 % ophthalmic solution Place 1 drop into both eyes 2 (two) times daily.   Yes [provider]  fluticasone Asencion Islam) 50 MCG/ACT nasal spray  05/28/18  Yes [provider]  hydrochlorothiazide (HYDRODIURIL) 25 MG tablet Take 25 mg by mouth daily.   Yes [provider]  lisinopril (PRINIVIL,ZESTRIL) 5 MG tablet Take 1 tablet (5 mg total) by mouth daily. 02/28/17  Yes Barrett, Erin R, PA-C  ondansetron (ZOFRAN) 8 MG tablet  04/27/18  Yes [provider]  pantoprazole (PROTONIX) 40 MG tablet Take 80 mg by mouth daily.    Yes [provider]  potassium chloride SA (  K-DUR,KLOR-CON) 20 MEQ tablet Take 1 tablet (20 mEq total) by mouth daily. 02/28/17  Yes Barrett, Erin R, PA-C  QUEtiapine (SEROQUEL) 25 MG tablet Take 25 mg by mouth at bedtime.   Yes [provider]  rosuvastatin (CRESTOR) 20 MG tablet Take 20 mg by mouth every evening.   Yes [provider]  timolol (BETIMOL) 0.5 % ophthalmic solution Place 1 drop into both eyes 2 (two) times daily.   Yes [provider]  timolol (TIMOPTIC) 0.5 % ophthalmic solution AS DIRECTED INSTILL 1 DROP IN BOTH EYES TWICE A DAY 02/19/17  Yes [provider]  venlafaxine XR (EFFEXOR-XR) 37.5 MG 24 hr capsule Take 187.5 mg by mouth at bedtime.    Yes [provider]  acetaminophen (TYLENOL) 500 MG tablet Take 2 tablets (1,000 mg total) by mouth every 6 (six) hours as needed. 02/28/17   Barrett, Erin R, PA-C  dexamethasone (OZURDEX) 0.7 MG IMPL 0.7 mg by Intravitreal route every 3 (three) months.     [provider]  docusate sodium (COLACE) 100 MG capsule Take 200 mg by mouth at bedtime.    [provider]  EPINEPHrine 0.3 mg/0.3 mL IJ SOAJ injection  05/28/18   [provider]  nystatin (MYCOSTATIN) 100000 UNIT/ML suspension  05/29/18   [provider]  VICTOZA 18 MG/3ML SOPN Inject into the skin. 08/12/21   [provider]    Current Outpatient Medications  Medication Sig Dispense Refill   amLODipine (NORVASC) 10 MG tablet Take 1 tablet (10 mg total) by mouth daily. 30 tablet 3   anastrozole (ARIMIDEX) 1 MG tablet      aspirin EC 325 MG EC tablet Take 1 tablet (325 mg total) by mouth daily. 30 tablet 0   cetirizine (ZYRTEC ALLERGY) 10 MG tablet Take 1 tablet (10 mg total) by mouth daily. 30 tablet 0   [employer] FIBER GUMMIES PO Take 2 capsules by mouth daily.     dorzolamide (TRUSOPT) 2 % ophthalmic solution Place 1 drop into both eyes 2 (two) times daily.     fluticasone (FLONASE) 50 MCG/ACT nasal spray      hydrochlorothiazide (HYDRODIURIL) 25 MG tablet Take 25 mg by mouth daily.     lisinopril (PRINIVIL,ZESTRIL) 5 MG tablet Take 1 tablet (5 mg total) by mouth daily. 30 tablet 3   ondansetron (ZOFRAN) 8 MG tablet      pantoprazole (PROTONIX) 40 MG tablet Take 80 mg by mouth daily.      potassium chloride SA (K-DUR,KLOR-CON) 20 MEQ tablet Take 1 tablet (20 mEq total) by mouth daily. 30 tablet 3   QUEtiapine (SEROQUEL) 25 MG tablet Take 25 mg by mouth at bedtime.     rosuvastatin (CRESTOR) 20 MG tablet Take 20 mg by mouth every evening.     timolol (BETIMOL) 0.5 % ophthalmic solution Place 1 drop into both eyes 2 (two) times daily.     timolol (TIMOPTIC) 0.5 % ophthalmic solution AS  DIRECTED INSTILL 1 DROP IN BOTH EYES TWICE A DAY  2   venlafaxine XR (EFFEXOR-XR) 37.5 MG 24 hr capsule Take 187.5 mg by mouth at bedtime.     acetaminophen (TYLENOL) 500 MG tablet Take 2 tablets (1,000 mg total) by mouth every 6 (six) hours as needed. 30 tablet 0   dexamethasone (OZURDEX) 0.7 MG IMPL 0.7 mg by Intravitreal route every 3 (three) months.      docusate sodium (COLACE) 100 MG capsule Take 200 mg by mouth at bedtime.  EPINEPHrine 0.3 mg/0.3 mL IJ SOAJ injection      nystatin (MYCOSTATIN) 100000 UNIT/ML suspension      VICTOZA 18 MG/3ML SOPN Inject into the skin.     Current Facility-Administered Medications  Medication Dose Route Frequency Provider Last Rate Last Admin   0.9 %  sodium chloride infusion  500 mL Intravenous Once Ludean Duhart, Lajuan Lines, MD        Allergies as of 08/15/2022 - Review Complete 08/15/2022  Allergen Reaction Noted   Shellfish allergy Anaphylaxis 11/29/2011   Hydrochlorothiazide  01/20/2009   Lansoprazole  09/05/2002   Metformin Nausea Only and Diarrhea 07/08/2015   Omeprazole  09/05/2002   Petrolatum-zinc oxide  01/25/2012   Pravastatin  12/11/2007   Simvastatin  01/17/2003   Tomato  01/25/2012   Bupropion Hives and Rash 06/23/2006    Family History  Adopted: Yes  Problem Relation Age of Onset   Diabetes Other        BIOLOGICAL AUNT   Heart disease Other        BIOLOGICAL AUNT   Colon cancer Neg Hx    Colon polyps Neg Hx    Stomach cancer Neg Hx     Social History   Socioeconomic History   Marital status: Single    Spouse name: Not on file   Number of children: 0   Years of education: 14    Highest education level: Not on file  Occupational History   Occupation: Retired     Fish farm manager: [employer]  Tobacco Use   Smoking status: Every Day    Packs/day: 2.00    Years: 40.00    Total pack years: 80.00    Types: Cigarettes    Last attempt to quit: 02/28/2017    Years since quitting: 5.4   Smokeless tobacco: Never   Tobacco  comments:    2pk per day   Vaping Use   Vaping Use: Former  Substance and Sexual Activity   Alcohol use: Not Currently   Drug use: No   Sexual activity: Not on file  Other Topics Concern   Not on file  Social History Narrative   Patient is single with no children.   Patient is right handed.   Patient has 14 yrs of education.   Patient drinks 2 cups daily.         Epworth Sleepiness Scale Score: 9      --I have HTN   --I have had a stroke   --I seem to be losing my sex drive   --I feel stressed and lack motivation   --I have been told that I stop breathing during sleep   --I wake up to urinate frequently at night   --I wake up with a dry mouth or sore throat   --I feel excessively sleepy and tired during the day   --I have Diabetes   --I have been told that I snore   --I am overweight or am gaining weight   --I wake up gasping for breath during the night   --I have dozed off or fallen asleep at inappropriate times during the day   Social Determinants of Health   Financial Resource Strain: Not on file  Food Insecurity: Not on file  Transportation Needs: No Transportation Needs (07/10/2018)   PRAPARE - Hydrologist (Medical): No    Lack of Transportation (Non-Medical): No  Physical Activity: Not on file  Stress: Not on file  Social Connections: Not on  file  Intimate Partner Violence: Not on file    Physical Exam: Vital signs in last 24 hours: '@BP'$  135/75   Pulse 79   Temp (!) 97.5 F (36.4 C)   Ht '5\' 4"'$  (1.626 m)   Wt 226 lb (102.5 kg)   LMP 09/27/2011   SpO2 94%   BMI 38.79 kg/m  GEN: NAD EYE: Sclerae anicteric ENT: MMM CV: Non-tachycardic Pulm: CTA b/l GI: Soft, NT/ND NEURO:  Alert & Oriented x 3   Zenovia Jarred, MD [facility]  08/15/2022 3:39 PM

## 2022-08-15 NOTE — Patient Instructions (Addendum)
The biopsies taken today have been sent for pathology.  The results can take 1-3 weeks to receive.    You may resume your previous diet and medication schedule.  A prescription for fluconazole has been sent to your pharmacy.  Take 2 (100 mg tablets) for one day then reduce to 1 (100 mg tablet) daily for 13 days.  Please follow up with your Primary Care Physician at [facility] to monitor your hemoglobin and iron levels.  Resume taking your iron medication.  Thank you for allowing [place] to care for you today!!!  YOU HAD AN ENDOSCOPIC PROCEDURE TODAY AT [place]:   Refer to the procedure report that was given to you for any specific questions about what was found during the examination.  If the procedure report does not answer your questions, please call your gastroenterologist to clarify.  If you requested that your care partner not be given the details of your procedure findings, then the procedure report has been included in a sealed envelope for you to review at your convenience later.  YOU SHOULD EXPECT: Some feelings of bloating in the abdomen. Passage of more gas than usual.  Walking can help get rid of the air that was put into your GI tract during the procedure and reduce the bloating.   Please Note:  You might notice some irritation and congestion in your nose or some drainage.  This is from the oxygen used during your procedure.  There is no need for concern and it should clear up in a day or so.  SYMPTOMS TO REPORT IMMEDIATELY:  Following upper endoscopy (EGD)  Vomiting of blood or coffee ground material  New chest pain or pain under the shoulder blades  Painful or persistently difficult swallowing  New shortness of breath  Fever of 100F or higher  Black, tarry-looking stools  For urgent or emergent issues, a gastroenterologist can be reached at any hour by calling [phone]. Do not use MyChart messaging for urgent concerns.    DIET:  We do  recommend a small meal at first, but then you may proceed to your regular diet.  Drink plenty of fluids but you should avoid alcoholic beverages for 24 hours.  ACTIVITY:  You should plan to take it easy for the rest of today and you should NOT DRIVE or use heavy machinery until tomorrow (because of the sedation medicines used during the test).    FOLLOW UP: Our staff will call the number listed on your records the next business day following your procedure.  We will call around 7:15- 8:00 am to check on you and address any questions or concerns that you may have regarding the information given to you following your procedure. If we do not reach you, we will leave a message.     If any biopsies were taken you will be contacted by phone or by letter within the next 1-3 weeks.  Please call us at [phone] if you have not heard about the biopsies in 3 weeks.    SIGNATURES/CONFIDENTIALITY: You and/or your care partner have signed paperwork which will be entered into your electronic medical record.  These signatures attest to the fact that that the information above on your After Visit Summary has been reviewed and is understood.  Full responsibility of the confidentiality of this discharge information lies with you and/or your care-partner.

## 2022-08-15 NOTE — Progress Notes (Unsigned)
Vss nad trans to pacu °

## 2022-08-16 ENCOUNTER — Ambulatory Visit (INDEPENDENT_AMBULATORY_CARE_PROVIDER_SITE_OTHER): Payer: No Typology Code available for payment source

## 2022-08-16 ENCOUNTER — Telehealth: Payer: Self-pay | Admitting: *Deleted

## 2022-08-16 VITALS — BP 121/78 | HR 77 | Temp 97.9°F | Resp 20 | Ht 64.0 in | Wt 226.4 lb

## 2022-08-16 DIAGNOSIS — D500 Iron deficiency anemia secondary to blood loss (chronic): Secondary | ICD-10-CM

## 2022-08-16 DIAGNOSIS — K922 Gastrointestinal hemorrhage, unspecified: Secondary | ICD-10-CM

## 2022-08-16 MED ORDER — DIPHENHYDRAMINE HCL 25 MG PO CAPS
25.0000 mg | ORAL_CAPSULE | Freq: Once | ORAL | Status: AC
  Administered 2022-08-16: 25 mg via ORAL
  Filled 2022-08-16: qty 1

## 2022-08-16 MED ORDER — ACETAMINOPHEN 325 MG PO TABS
650.0000 mg | ORAL_TABLET | Freq: Once | ORAL | Status: AC
  Administered 2022-08-16: 650 mg via ORAL
  Filled 2022-08-16: qty 2

## 2022-08-16 MED ORDER — SODIUM CHLORIDE 0.9 % IV SOLN
300.0000 mg | INTRAVENOUS | Status: DC
  Administered 2022-08-16: 300 mg via INTRAVENOUS
  Filled 2022-08-16: qty 15

## 2022-08-16 NOTE — Telephone Encounter (Signed)
  Follow up Call-     08/15/2022    2:51 PM  Call back number  Post procedure Call Back phone  # [phone]  Permission to leave phone message Yes     Patient questions:  Do you have a fever, pain , or abdominal swelling? No. Pain Score  0 *  Have you tolerated food without any problems? Yes.    Have you been able to return to your normal activities? Yes.    Do you have any questions about your discharge instructions: Diet   No. Medications  No. Follow up visit  No.  Do you have questions or concerns about your Care? No.  Actions: * If pain score is 4 or above: No action needed, pain <4.

## 2022-08-16 NOTE — Progress Notes (Signed)
Diagnosis: Iron Deficiency Anemia  Provider:  Marshell Garfinkel MD  Procedure: Infusion  IV Type: Peripheral, IV Location: L Forearm  Venofer (Iron Sucrose), Dose: 300 mg  Infusion Start Time: [id number]  Infusion Stop Time: 1235  11.55 am Pt was asleep and woke up with Shortness of breath and some Lt arm pain. V/S obtained, POX 90-94% room air. Pt encouraged deep breathing and more awake at this time. Iv site flushed and noted good blood return.Rate decresed to 139m/hr. Will continue to monitor. 1236 Infusion completed without any symptoms.Discharge instruction given.Pt verbalize understanding.  Post Infusion IV Care: Peripheral IV Discontinued  Discharge: Condition: Good, Destination: Home . AVS Provided  Performed by:  SArnoldo Morale RN

## 2022-08-18 ENCOUNTER — Encounter: Payer: Self-pay | Admitting: Internal Medicine

## 2022-08-23 ENCOUNTER — Ambulatory Visit (INDEPENDENT_AMBULATORY_CARE_PROVIDER_SITE_OTHER): Payer: No Typology Code available for payment source

## 2022-08-23 VITALS — BP 108/65 | HR 71 | Temp 98.3°F | Resp 16

## 2022-08-23 DIAGNOSIS — D500 Iron deficiency anemia secondary to blood loss (chronic): Secondary | ICD-10-CM

## 2022-08-23 DIAGNOSIS — K922 Gastrointestinal hemorrhage, unspecified: Secondary | ICD-10-CM

## 2022-08-23 MED ORDER — SODIUM CHLORIDE 0.9 % IV SOLN
300.0000 mg | INTRAVENOUS | Status: DC
  Administered 2022-08-23: 300 mg via INTRAVENOUS
  Filled 2022-08-23: qty 15

## 2022-08-23 MED ORDER — ACETAMINOPHEN 325 MG PO TABS
650.0000 mg | ORAL_TABLET | Freq: Once | ORAL | Status: AC
  Administered 2022-08-23: 650 mg via ORAL
  Filled 2022-08-23: qty 2

## 2022-08-23 MED ORDER — DIPHENHYDRAMINE HCL 25 MG PO CAPS
25.0000 mg | ORAL_CAPSULE | Freq: Once | ORAL | Status: AC
  Administered 2022-08-23: 25 mg via ORAL
  Filled 2022-08-23: qty 1

## 2022-08-23 NOTE — Progress Notes (Signed)
Diagnosis: Iron Deficiency Anemia  Provider:  Marshell Garfinkel MD  Procedure: Infusion  IV Type: Peripheral, IV Location: L Antecubital  Venofer (Iron Sucrose), Dose: 300 mg  Infusion Start Time: [id number]  Infusion Stop Time: 1219  Post Infusion IV Care: Patient declined observation and Peripheral IV Discontinued  Discharge: Condition: Stable, Destination: Home . AVS Provided  Performed by:  Binnie Kand, RN

## 2022-09-08 ENCOUNTER — Encounter: Payer: Self-pay | Admitting: Internal Medicine

## 2022-10-04 ENCOUNTER — Telehealth: Payer: Self-pay | Admitting: Nurse Practitioner

## 2022-10-04 NOTE — Telephone Encounter (Signed)
Susan Farley, pls contact patient and see if she was able to locate her colonoscopy  report done by a GI in [facility] which she previously reported was done one year ago. Patient to provide a copy of this colonoscopy report for my review. THX

## 2022-10-04 NOTE — Telephone Encounter (Signed)
The pt has been asked to get the colon report from the GI in [facility]. She will call them today and have them fax it to our office. Fax number was provided.    FYI Colleen.

## 2022-10-27 ENCOUNTER — Telehealth: Payer: Self-pay | Admitting: Internal Medicine

## 2022-10-28 ENCOUNTER — Telehealth: Payer: Self-pay | Admitting: Internal Medicine

## 2022-10-31 ENCOUNTER — Inpatient Hospital Stay: Payer: Federal, State, Local not specified - PPO

## 2022-10-31 ENCOUNTER — Inpatient Hospital Stay: Payer: Self-pay | Admitting: Internal Medicine

## 2023-06-06 ENCOUNTER — Encounter: Payer: Self-pay | Admitting: Podiatry

## 2023-06-06 ENCOUNTER — Ambulatory Visit: Payer: Federal, State, Local not specified - PPO | Admitting: Podiatry

## 2023-06-06 DIAGNOSIS — N951 Menopausal and female climacteric states: Secondary | ICD-10-CM | POA: Insufficient documentation

## 2023-06-06 DIAGNOSIS — N809 Endometriosis, unspecified: Secondary | ICD-10-CM | POA: Insufficient documentation

## 2023-06-06 DIAGNOSIS — K429 Umbilical hernia without obstruction or gangrene: Secondary | ICD-10-CM | POA: Insufficient documentation

## 2023-06-06 DIAGNOSIS — M7750 Other enthesopathy of unspecified foot: Secondary | ICD-10-CM | POA: Insufficient documentation

## 2023-06-06 DIAGNOSIS — F330 Major depressive disorder, recurrent, mild: Secondary | ICD-10-CM | POA: Insufficient documentation

## 2023-06-06 DIAGNOSIS — N879 Dysplasia of cervix uteri, unspecified: Secondary | ICD-10-CM | POA: Insufficient documentation

## 2023-06-06 DIAGNOSIS — N6099 Unspecified benign mammary dysplasia of unspecified breast: Secondary | ICD-10-CM | POA: Insufficient documentation

## 2023-06-06 DIAGNOSIS — B351 Tinea unguium: Secondary | ICD-10-CM | POA: Insufficient documentation

## 2023-06-06 DIAGNOSIS — J329 Chronic sinusitis, unspecified: Secondary | ICD-10-CM | POA: Insufficient documentation

## 2023-06-06 DIAGNOSIS — G629 Polyneuropathy, unspecified: Secondary | ICD-10-CM | POA: Insufficient documentation

## 2023-06-06 DIAGNOSIS — R109 Unspecified abdominal pain: Secondary | ICD-10-CM | POA: Insufficient documentation

## 2023-06-06 DIAGNOSIS — D0220 Carcinoma in situ of unspecified bronchus and lung: Secondary | ICD-10-CM | POA: Insufficient documentation

## 2023-06-06 DIAGNOSIS — R209 Unspecified disturbances of skin sensation: Secondary | ICD-10-CM | POA: Insufficient documentation

## 2023-06-06 DIAGNOSIS — R609 Edema, unspecified: Secondary | ICD-10-CM | POA: Insufficient documentation

## 2023-06-06 DIAGNOSIS — E611 Iron deficiency: Secondary | ICD-10-CM | POA: Insufficient documentation

## 2023-06-06 DIAGNOSIS — M79646 Pain in unspecified finger(s): Secondary | ICD-10-CM | POA: Insufficient documentation

## 2023-06-06 DIAGNOSIS — R140 Abdominal distension (gaseous): Secondary | ICD-10-CM | POA: Insufficient documentation

## 2023-06-06 DIAGNOSIS — D649 Anemia, unspecified: Secondary | ICD-10-CM | POA: Insufficient documentation

## 2023-06-06 DIAGNOSIS — H6120 Impacted cerumen, unspecified ear: Secondary | ICD-10-CM | POA: Insufficient documentation

## 2023-06-06 DIAGNOSIS — M7752 Other enthesopathy of left foot: Secondary | ICD-10-CM

## 2023-06-06 DIAGNOSIS — M7520 Bicipital tendinitis, unspecified shoulder: Secondary | ICD-10-CM | POA: Insufficient documentation

## 2023-06-06 DIAGNOSIS — D398 Neoplasm of uncertain behavior of other specified female genital organs: Secondary | ICD-10-CM | POA: Insufficient documentation

## 2023-06-06 DIAGNOSIS — E094 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, unspecified: Secondary | ICD-10-CM | POA: Insufficient documentation

## 2023-06-06 DIAGNOSIS — M542 Cervicalgia: Secondary | ICD-10-CM | POA: Insufficient documentation

## 2023-06-06 DIAGNOSIS — K5900 Constipation, unspecified: Secondary | ICD-10-CM | POA: Insufficient documentation

## 2023-06-06 DIAGNOSIS — D2372 Other benign neoplasm of skin of left lower limb, including hip: Secondary | ICD-10-CM

## 2023-06-06 DIAGNOSIS — F32A Depression, unspecified: Secondary | ICD-10-CM | POA: Insufficient documentation

## 2023-06-06 DIAGNOSIS — R011 Cardiac murmur, unspecified: Secondary | ICD-10-CM | POA: Insufficient documentation

## 2023-06-06 DIAGNOSIS — R1031 Right lower quadrant pain: Secondary | ICD-10-CM | POA: Insufficient documentation

## 2023-06-06 DIAGNOSIS — R200 Anesthesia of skin: Secondary | ICD-10-CM | POA: Insufficient documentation

## 2023-06-06 DIAGNOSIS — H04123 Dry eye syndrome of bilateral lacrimal glands: Secondary | ICD-10-CM | POA: Insufficient documentation

## 2023-06-06 DIAGNOSIS — C50919 Malignant neoplasm of unspecified site of unspecified female breast: Secondary | ICD-10-CM | POA: Insufficient documentation

## 2023-06-06 DIAGNOSIS — R0789 Other chest pain: Secondary | ICD-10-CM | POA: Insufficient documentation

## 2023-06-06 DIAGNOSIS — R52 Pain, unspecified: Secondary | ICD-10-CM | POA: Insufficient documentation

## 2023-06-06 DIAGNOSIS — L84 Corns and callosities: Secondary | ICD-10-CM | POA: Insufficient documentation

## 2023-06-06 DIAGNOSIS — D0511 Intraductal carcinoma in situ of right breast: Secondary | ICD-10-CM | POA: Insufficient documentation

## 2023-06-06 DIAGNOSIS — H548 Legal blindness, as defined in USA: Secondary | ICD-10-CM | POA: Insufficient documentation

## 2023-06-06 DIAGNOSIS — I8390 Asymptomatic varicose veins of unspecified lower extremity: Secondary | ICD-10-CM | POA: Insufficient documentation

## 2023-06-06 DIAGNOSIS — R143 Flatulence: Secondary | ICD-10-CM | POA: Insufficient documentation

## 2023-06-06 DIAGNOSIS — E1139 Type 2 diabetes mellitus with other diabetic ophthalmic complication: Secondary | ICD-10-CM | POA: Insufficient documentation

## 2023-06-06 DIAGNOSIS — N946 Dysmenorrhea, unspecified: Secondary | ICD-10-CM | POA: Insufficient documentation

## 2023-06-06 DIAGNOSIS — Z0130 Encounter for examination of blood pressure without abnormal findings: Secondary | ICD-10-CM | POA: Insufficient documentation

## 2023-06-06 DIAGNOSIS — H53489 Generalized contraction of visual field, unspecified eye: Secondary | ICD-10-CM | POA: Insufficient documentation

## 2023-06-06 DIAGNOSIS — H6990 Unspecified Eustachian tube disorder, unspecified ear: Secondary | ICD-10-CM | POA: Insufficient documentation

## 2023-06-06 DIAGNOSIS — G4710 Hypersomnia, unspecified: Secondary | ICD-10-CM | POA: Insufficient documentation

## 2023-06-06 DIAGNOSIS — L739 Follicular disorder, unspecified: Secondary | ICD-10-CM | POA: Insufficient documentation

## 2023-06-06 DIAGNOSIS — M79673 Pain in unspecified foot: Secondary | ICD-10-CM | POA: Insufficient documentation

## 2023-06-06 MED ORDER — DEXAMETHASONE SODIUM PHOSPHATE 120 MG/30ML IJ SOLN
2.0000 mg | Freq: Once | INTRAMUSCULAR | Status: AC
  Administered 2023-06-06: 2 mg via INTRA_ARTICULAR

## 2023-06-06 NOTE — Progress Notes (Signed)
Subjective:  Patient ID: Susan Farley, female    DOB: 1960/07/01,  MRN: [id number] HPI Chief Complaint  Patient presents with   Callouses    RM#8 Callus on left foot outside boarder of foot by pinky toe very painful.    62 y.o. female presents with the above complaint.   ROS: Denies fever chills nausea vomiting muscle aches pains calf pain back pain chest pain shortness of breath.  Past Medical History:  Diagnosis Date   Allergy    Anemia    Anxiety    Asthma    Cancer ([facility])    Cataract    Colitis    Depression    Diabetes mellitus without complication (HCC)    GERD (gastroesophageal reflux disease)    Heart murmur    Hypercholesteremia    Hypertension    Retinitis pigmentosa    Sleep apnea    doesnt wear BIPAP currently   Stroke ([facility]) 04/2014   no residual   Past Surgical History:  Procedure Laterality Date   BONE GRAFT HIP ILIAC CREST     CATARACT EXTRACTION     COLONOSCOPY     IUD REMOVAL     LOBECTOMY Left 02/24/2017   Procedure: Left upper lobe LOBECTOMY;  Surgeon: Loreli Slot, MD;  Location: [facility];  Service: Thoracic;  Laterality: Left;   NODE DISSECTION Left 02/24/2017   Procedure: NODE DISSECTION;  Surgeon: Loreli Slot, MD;  Location: [facility];  Service: Thoracic;  Laterality: Left;   OOPHORECTOMY Right 2004   removal of cyst and ovary   RETINAL CRYOABLATION Bilateral    patient unsure of whether she had this   VIDEO ASSISTED THORACOSCOPY (VATS)/WEDGE RESECTION Left 02/24/2017   Procedure: Left VIDEO ASSISTED THORACOSCOPY (VATS)/WEDGE RESECTION;  Surgeon: Loreli Slot, MD;  Location: [facility];  Service: Thoracic;  Laterality: Left;    Current Outpatient Medications:    acetaminophen (TYLENOL) 500 MG tablet, Take 2 tablets (1,000 mg total) by mouth every 6 (six) hours as needed., Disp: 30 tablet, Rfl: 0   amLODipine (NORVASC) 10 MG tablet, Take 1 tablet (10 mg total) by mouth daily., Disp: 30 tablet, Rfl: 3   anastrozole  (ARIMIDEX) 1 MG tablet, , Disp: , Rfl:    aspirin EC 325 MG EC tablet, Take 1 tablet (325 mg total) by mouth daily., Disp: 30 tablet, Rfl: 0   cetirizine (ZYRTEC ALLERGY) 10 MG tablet, Take 1 tablet (10 mg total) by mouth daily., Disp: 30 tablet, Rfl: 0   [employer] FIBER GUMMIES PO, Take 2 capsules by mouth daily., Disp: , Rfl:    dexamethasone (OZURDEX) 0.7 MG IMPL, 0.7 mg by Intravitreal route every 3 (three) months. , Disp: , Rfl:    docusate sodium (COLACE) 100 MG capsule, Take 200 mg by mouth at bedtime., Disp: , Rfl:    dorzolamide (TRUSOPT) 2 % ophthalmic solution, Place 1 drop into both eyes 2 (two) times daily., Disp: , Rfl:    EPINEPHrine 0.3 mg/0.3 mL IJ SOAJ injection, , Disp: , Rfl:    fluconazole (DIFLUCAN) 100 MG tablet, 200 mg for 1 day then 100 mg for 13 days, Disp: 15 tablet, Rfl: 0   fluticasone (FLONASE) 50 MCG/ACT nasal spray, , Disp: , Rfl:    hydrochlorothiazide (HYDRODIURIL) 25 MG tablet, Take 25 mg by mouth daily., Disp: , Rfl:    lisinopril (PRINIVIL,ZESTRIL) 5 MG tablet, Take 1 tablet (5 mg total) by mouth daily., Disp: 30 tablet, Rfl: 3   nystatin (MYCOSTATIN) 100000  UNIT/ML suspension, , Disp: , Rfl:    ondansetron (ZOFRAN) 8 MG tablet, , Disp: , Rfl:    pantoprazole (PROTONIX) 40 MG tablet, Take 80 mg by mouth daily. , Disp: , Rfl:    potassium chloride SA (K-DUR,KLOR-CON) 20 MEQ tablet, Take 1 tablet (20 mEq total) by mouth daily., Disp: 30 tablet, Rfl: 3   QUEtiapine (SEROQUEL) 25 MG tablet, Take 25 mg by mouth at bedtime., Disp: , Rfl:    rosuvastatin (CRESTOR) 20 MG tablet, Take 20 mg by mouth every evening., Disp: , Rfl:    timolol (BETIMOL) 0.5 % ophthalmic solution, Place 1 drop into both eyes 2 (two) times daily., Disp: , Rfl:    timolol (TIMOPTIC) 0.5 % ophthalmic solution, AS DIRECTED INSTILL 1 DROP IN BOTH EYES TWICE A DAY, Disp: , Rfl: 2   venlafaxine XR (EFFEXOR-XR) 37.5 MG 24 hr capsule, Take 187.5 mg by mouth at bedtime., Disp: , Rfl:    VICTOZA 18 MG/3ML  SOPN, Inject into the skin., Disp: , Rfl:   Allergies  Allergen Reactions   Shellfish Allergy Anaphylaxis   Hydrochlorothiazide     Other reaction(s): Other (See Comments)   Lansoprazole     Other reaction(s): Other (See Comments)   Metformin Nausea Only and Diarrhea    Other reaction(s): GI Upset (intolerance)   Omeprazole     Other reaction(s): Other (See Comments)   Petrolatum-Zinc Oxide     Other reaction(s): Other (See Comments)   Pravastatin     Other reaction(s): Other (See Comments)   Simvastatin     Other reaction(s): MYALGIA, Myalgias (intolerance)   Tomato     Other reaction(s): Other (See Comments)   Bupropion Hives and Rash   Empagliflozin     Other Reaction(s): Increased frequency of urination, Other (See Comments)   Pregabalin     Other Reaction(s): Muscle pain   Review of Systems Objective:  There were no vitals filed for this visit.  General: Well developed, nourished, in no acute distress, alert and oriented x3   Dermatological: Skin is warm, dry and supple bilateral. Nails x 10 are well maintained; remaining integument appears unremarkable at this time. There are no open sores, no preulcerative lesions, no rash or signs of infection present.  Painful benign skin lesion beneath fifth metatarsal head of the left foot.  Vascular: Dorsalis Pedis artery and Posterior Tibial artery pedal pulses are 2/4 bilateral with immedate capillary fill time. Pedal hair growth present. No varicosities and no lower extremity edema present bilateral.   Neruologic: Grossly intact via light touch bilateral. Vibratory intact via tuning fork bilateral. Protective threshold with Semmes Wienstein monofilament intact to all pedal sites bilateral. Patellar and Achilles deep tendon reflexes 2+ bilateral. No Babinski or clonus noted bilateral.   Musculoskeletal: No gross boney pedal deformities bilateral. No pain, crepitus, or limitation noted with foot and ankle range of motion bilateral.  Muscular strength 5/5 in all groups tested bilateral.  Underlying bursa fifth metatarsal head left foot.  Gait: Unassisted, Nonantalgic.    Radiographs:  None taken  Assessment & Plan:   Assessment: Bursitis and porokeratotic lesion or benign skin lesion left  Plan: Injected dexamethasone local anesthetic debrided benign skin lesion.  Discussed appropriate shoe gear.     Nora Sabey T. [place], [place]

## 2023-07-06 ENCOUNTER — Telehealth: Payer: Self-pay | Admitting: Radiation Oncology

## 2023-07-06 NOTE — Telephone Encounter (Addendum)
Received medical record request from [facility] center. Records sent, forwarded request to [employer].

## 2023-07-24 ENCOUNTER — Encounter: Payer: Self-pay | Admitting: Internal Medicine

## 2023-07-25 ENCOUNTER — Other Ambulatory Visit (HOSPITAL_COMMUNITY): Payer: Self-pay | Admitting: Student

## 2023-07-25 DIAGNOSIS — R847 Abnormal histological findings in specimens from respiratory organs and thorax: Secondary | ICD-10-CM

## 2023-07-25 DIAGNOSIS — R590 Localized enlarged lymph nodes: Secondary | ICD-10-CM

## 2023-07-25 NOTE — Progress Notes (Signed)
 Susan Simmonds, MD  Susan Farley PROCEDURE / BIOPSY REVIEW Date: 07/25/23  Requested Biopsy site: Lymph node Reason for request: Adenopathy. Recent DX lung CA Imaging review: Best seen on OSH PET CT, 07/06/23  Decision: Approved Imaging modality to perform: Ultrasound Schedule with: No sedation / Local anesthetic Schedule for: Any VIR  Additional comments: @VIR : enlarged HM R inguinal LNs @Schedulers . US  Inguinal LN Bx  Please contact me with questions, concerns, or if issue pertaining to this request arise.  Farley Hughes, MD [facility]       Previous Messages    ----- Message ----- From: Leigh Blas Sent: 07/25/2023  10:30 AM EST To: Channing LITTIE Eagles; Farley Susan; * Subject: US  Core biopsy ( lymph nodes)                  Procedure : US  Core Biopsy ( lymph nodes)  Reason:pelvic sidewall lymph node Dx: Inguinal lymphadenopathy [R59.0 (ICD-10-CM)]   History : CT PET in PACs  ** order and EPIC under media dated 07/25/23  - has PET report on pg 2  Provider : Carollee Sieving, DO  Provider contact : [phone]

## 2023-08-02 ENCOUNTER — Other Ambulatory Visit: Payer: Self-pay | Admitting: Radiation Oncology

## 2023-08-02 ENCOUNTER — Inpatient Hospital Stay
Admission: RE | Admit: 2023-08-02 | Discharge: 2023-08-02 | Disposition: A | Discharge status: Home / Self Care | Payer: Self-pay | Source: Ambulatory Visit | Attending: Radiation Oncology | Admitting: Radiation Oncology

## 2023-08-02 ENCOUNTER — Telehealth: Payer: Self-pay | Admitting: Radiation Oncology

## 2023-08-02 DIAGNOSIS — C3490 Malignant neoplasm of unspecified part of unspecified bronchus or lung: Secondary | ICD-10-CM

## 2023-08-02 NOTE — Telephone Encounter (Addendum)
2/12 Received referral from [facility], but incomplete.  F/U call to Dr. Herbert Moors office for complete ref with path report, images report.  Also sent via stat fax request for recent images to be push to powershare from [facility].  Waiting on images and reports.  Received complete referral.  Follow up call to [facility], left voicemail need recent images to be uploaded to PACs.  Waiting on images.

## 2023-08-02 NOTE — Telephone Encounter (Signed)
2/12 Left message with Dr. Christell Faith' s office [facility] , patient is now scheduled for 2/25, so they are aware.

## 2023-08-09 ENCOUNTER — Other Ambulatory Visit: Payer: Self-pay | Admitting: Radiology

## 2023-08-09 DIAGNOSIS — R591 Generalized enlarged lymph nodes: Secondary | ICD-10-CM

## 2023-08-09 NOTE — H&P (Shared)
 Chief Complaint: right inguinal lymphadenopathy - image guided core lymph node biopsy   Referring Provider(s): Ritta Slot   Supervising Physician: Richarda Overlie  Patient Status: [facility] - Out-pt  History of Present Illness: Susan Farley is a 63 y.o. female with a history of asthma, diabetes, and stroke. Pt also has a recent diagnosis of lung cancer followed by Dr. Loistine Simas.  A recent PET scan from 07/06/23 revealed right inguinal lymphadenopathy concerning for metastasis.  Pt was referred to interventional radiology for possible image guided right inguinal lymph node biopsy.  Imaging was reviewed by Dr. Milford Cage 07/25/23 and approved for image guided right inguinal lymph node biopsy.    *** Patient is Full Code  Past Medical History:  Diagnosis Date   Allergy    Anemia    Anxiety    Asthma    Cancer (HCC)    Cataract    Colitis    Depression    Diabetes mellitus without complication (HCC)    GERD (gastroesophageal reflux disease)    Heart murmur    Hypercholesteremia    Hypertension    Retinitis pigmentosa    Sleep apnea    doesnt wear BIPAP currently   Stroke (HCC) 04/2014   no residual    Past Surgical History:  Procedure Laterality Date   BONE GRAFT HIP ILIAC CREST     CATARACT EXTRACTION     COLONOSCOPY     IUD REMOVAL     LOBECTOMY Left 02/24/2017   Procedure: Left upper lobe LOBECTOMY;  Surgeon: Loreli Slot, MD;  Location: [facility] OR;  Service: Thoracic;  Laterality: Left;   NODE DISSECTION Left 02/24/2017   Procedure: NODE DISSECTION;  Surgeon: Loreli Slot, MD;  Location: [facility] OR;  Service: Thoracic;  Laterality: Left;   OOPHORECTOMY Right 2004   removal of cyst and ovary   RETINAL CRYOABLATION Bilateral    patient unsure of whether she had this   VIDEO ASSISTED THORACOSCOPY (VATS)/WEDGE RESECTION Left 02/24/2017   Procedure: Left VIDEO ASSISTED THORACOSCOPY (VATS)/WEDGE RESECTION;  Surgeon: Loreli Slot, MD;  Location: [facility] OR;   Service: Thoracic;  Laterality: Left;    Allergies: Shellfish allergy, Hydrochlorothiazide, Lansoprazole, Metformin, Omeprazole, Petrolatum-zinc oxide, Pravastatin, Simvastatin, Tomato, Bupropion, Empagliflozin, and Pregabalin  Medications: Prior to Admission medications   Medication Sig Start Date End Date Taking? Authorizing Provider  acetaminophen (TYLENOL) 500 MG tablet Take 2 tablets (1,000 mg total) by mouth every 6 (six) hours as needed. 02/28/17   Barrett, Erin R, PA-C  amLODipine (NORVASC) 10 MG tablet Take 1 tablet (10 mg total) by mouth daily. 02/28/17   Barrett, Erin R, PA-C  anastrozole (ARIMIDEX) 1 MG tablet  06/04/18   [provider]  aspirin EC 325 MG EC tablet Take 1 tablet (325 mg total) by mouth daily. 04/25/14   Mikhail, Nita Sells, DO  cetirizine (ZYRTEC ALLERGY) 10 MG tablet Take 1 tablet (10 mg total) by mouth daily. 10/26/19   Wallis Bamberg, PA-C  [employer] FIBER GUMMIES PO Take 2 capsules by mouth daily.    [provider]  dexamethasone (OZURDEX) 0.7 MG IMPL 0.7 mg by Intravitreal route every 3 (three) months.     [provider]  docusate sodium (COLACE) 100 MG capsule Take 200 mg by mouth at bedtime.    [provider]  dorzolamide (TRUSOPT) 2 % ophthalmic solution Place 1 drop into both eyes 2 (two) times daily.    [provider]  EPINEPHrine 0.3 mg/0.3 mL IJ SOAJ  injection  05/28/18   [provider]  fluconazole (DIFLUCAN) 100 MG tablet 200 mg for 1 day then 100 mg for 13 days 08/15/22   Beverley Fiedler, MD  fluticasone Aleda Grana) 50 MCG/ACT nasal spray  05/28/18   [provider]  hydrochlorothiazide (HYDRODIURIL) 25 MG tablet Take 25 mg by mouth daily.    [provider]  lisinopril (PRINIVIL,ZESTRIL) 5 MG tablet Take 1 tablet (5 mg total) by mouth daily. 02/28/17   Barrett, Erin R, PA-C  nystatin (MYCOSTATIN) 100000 UNIT/ML suspension  05/29/18   [provider]  ondansetron (ZOFRAN) 8 MG tablet   04/27/18   [provider]  pantoprazole (PROTONIX) 40 MG tablet Take 80 mg by mouth daily.     [provider]  potassium chloride SA (K-DUR,KLOR-CON) 20 MEQ tablet Take 1 tablet (20 mEq total) by mouth daily. 02/28/17   Barrett, Erin R, PA-C  QUEtiapine (SEROQUEL) 25 MG tablet Take 25 mg by mouth at bedtime.    [provider]  rosuvastatin (CRESTOR) 20 MG tablet Take 20 mg by mouth every evening.    [provider]  timolol (BETIMOL) 0.5 % ophthalmic solution Place 1 drop into both eyes 2 (two) times daily.    [provider]  timolol (TIMOPTIC) 0.5 % ophthalmic solution AS DIRECTED INSTILL 1 DROP IN BOTH EYES TWICE A DAY 02/19/17   [provider]  venlafaxine XR (EFFEXOR-XR) 37.5 MG 24 hr capsule Take 187.5 mg by mouth at bedtime.    [provider]  VICTOZA 18 MG/3ML SOPN Inject into the skin. 08/12/21   [provider]     Family History  Adopted: Yes  Problem Relation Age of Onset   Diabetes Other        BIOLOGICAL AUNT   Heart disease Other        BIOLOGICAL AUNT   Colon cancer Neg Hx    Colon polyps Neg Hx    Stomach cancer Neg Hx     Social History   Socioeconomic History   Marital status: Single    Spouse name: Not on file   Number of children: 0   Years of education: 14    Highest education level: Not on file  Occupational History   Occupation: Retired     Associate Professor: [employer]  Tobacco Use   Smoking status: Every Day    Current packs/day: 0.00    Average packs/day: 2.0 packs/day for 40.0 years (80.0 ttl pk-yrs)    Types: Cigarettes    Start date: 02/28/1977    Last attempt to quit: 02/28/2017    Years since quitting: 6.4   Smokeless tobacco: Never   Tobacco comments:    2pk per day   Vaping Use   Vaping status: Former  Substance and Sexual Activity   Alcohol use: Not Currently   Drug use: No   Sexual activity: Not on file  Other Topics Concern   Not on file  Social History Narrative    Patient is single with no children.   Patient is right handed.   Patient has 14 yrs of education.   Patient drinks 2 cups daily.         Epworth Sleepiness Scale Score: 9      --I have HTN   --I have had a stroke   --I seem to be losing my sex drive   --I feel stressed and lack motivation   --I have been told that I stop breathing during  sleep   --I wake up to urinate frequently at night   --I wake up with a dry mouth or sore throat   --I feel excessively sleepy and tired during the day   --I have Diabetes   --I have been told that I snore   --I am overweight or am gaining weight   --I wake up gasping for breath during the night   --I have dozed off or fallen asleep at inappropriate times during the day   Social Drivers of Corporate investment banker Strain: Not on file  Food Insecurity: Not on file  Transportation Needs: No Transportation Needs (07/10/2018)   PRAPARE - Administrator, Civil Service (Medical): No    Lack of Transportation (Non-Medical): No  Physical Activity: Not on file  Stress: Not on file  Social Connections: Not on file     Review of Systems: A 12 point ROS discussed and pertinent positives are indicated in the HPI above.  All other systems are negative.  Review of Systems  Vital Signs: LMP 09/27/2011   Advance Care Plan: The advanced care place/surrogate decision maker was discussed at the time of visit and the patient did not wish to discuss or was not able to name a surrogate decision maker or provide an advance care plan.  Physical Exam  Imaging: No results found.  Labs:  CBC: No results for input(s): "WBC", "HGB", "HCT", "PLT" in the last 8760 hours.  COAGS: No results for input(s): "INR", "APTT" in the last 8760 hours.  BMP: No results for input(s): "NA", "K", "CL", "CO2", "GLUCOSE", "BUN", "CALCIUM", "CREATININE", "GFRNONAA", "GFRAA" in the last 8760 hours.  Invalid input(s): "CMP"  LIVER FUNCTION TESTS: No  results for input(s): "BILITOT", "AST", "ALT", "ALKPHOS", "PROT", "ALBUMIN" in the last 8760 hours.  TUMOR MARKERS: No results for input(s): "AFPTM", "CEA", "CA199", "CHROMGRNA" in the last 8760 hours.  Assessment and Plan:  Pt with new diagnoses of lung cancer found to have right inguinal lymphadenopathy on PET scan from 07/06/23 scheduled for image guided right inguinal lymph node biopsy on 08/10/23.    Imaging was reviewed by Dr. Milford Cage 07/25/23 and approved for image guided right inguinal lymph node biopsy.    Risks and benefits of image guided right inguinal lymph node boipsy was discussed with the patient and/or patient's family including, but not limited to bleeding, infection, damage to adjacent structures or low yield requiring additional tests.  All of the questions were answered and there is agreement to proceed.  Consent signed and in chart.   Thank you for allowing our service to participate in Susan Farley 's care.  Electronically Signed: Loman Brooklyn, PA-C   08/09/2023, 4:36 PM    I spent a total of  30 Minutes   in face to face in clinical consultation, greater than 50% of which was counseling/coordinating care for image guided right inguinal lymph node biopsy.

## 2023-08-10 ENCOUNTER — Ambulatory Visit (HOSPITAL_COMMUNITY)
Admission: RE | Admit: 2023-08-10 | Discharge: 2023-08-10 | Disposition: A | Discharge status: Home / Self Care | Payer: No Typology Code available for payment source | Source: Ambulatory Visit | Attending: Student | Admitting: Student

## 2023-08-10 VITALS — BP 124/58

## 2023-08-10 DIAGNOSIS — D500 Iron deficiency anemia secondary to blood loss (chronic): Secondary | ICD-10-CM

## 2023-08-10 DIAGNOSIS — C3492 Malignant neoplasm of unspecified part of left bronchus or lung: Secondary | ICD-10-CM

## 2023-08-10 DIAGNOSIS — R590 Localized enlarged lymph nodes: Secondary | ICD-10-CM | POA: Insufficient documentation

## 2023-08-10 MED ORDER — LIDOCAINE HCL 1 % IJ SOLN
INTRAMUSCULAR | Status: AC
  Filled 2023-08-10: qty 20

## 2023-08-10 NOTE — Procedures (Signed)
 Interventional Radiology Procedure:   Indications: History of lung cancer with inguinal lymphadenopathy  Procedure: US guided core biopsy of left inguinal lymph node  Findings: 4 core biopsies obtained and placed in saline.   Complications: None     EBL: Minimal  Plan: Discharge to home.   Lin Hackmann R. Lowella Dandy, MD  Pager: [phone]

## 2023-08-10 NOTE — Progress Notes (Signed)
 Patient ID: Susan Farley, female   DOB: 1961/05/09, 63 y.o.   MRN: [id number] Patient presents to [place] dept today for right inguinal lymph node biopsy.  Recently diagnosed with lung cancer.  Details/risks of procedure, including but not limited to, internal bleeding, infection, injury to adjacent structures discussed with patient with her understanding and consent.  Procedure will be performed with local anesthesia.

## 2023-08-11 LAB — SURGICAL PATHOLOGY

## 2023-08-14 ENCOUNTER — Telehealth: Payer: Self-pay | Admitting: Radiation Oncology

## 2023-08-14 NOTE — Progress Notes (Signed)
 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   HISTORY OF PRESENT ILLNESS: Susan Farley is a 63 y.o. female seen at the request of Dr. Christell Faith for a diagnosis of small cell lung cancer. The patient is known to our clinic for a previous diagnosis of  right breast cancer in 2019, as well as left upper lobectomy in 2018 for early stage NSCLC, squamous cell carcinoma.  For her breast cancer, she was treated with  neoadjuvant chemotherapy between 12/22/2017 and 03/15/2018 followed by right modified radical mastectomy with axillary dissection and left mastectomy.  Her final pathology in the right breast revealed a 2 cm residual focus of invasive ductal carcinoma, grade 2 with clear margins.  2 of the 13 sampled lymph nodes contained metastatic disease.  Her tumor was ER positive, progesterone negative, HER-2 negative.  Her left mastectomy site revealed benign breast tissue with radial scar/complex sclerosing lesion negative for in situ or invasive carcinoma, and her lymph node in the axilla on the left was also negative. She declind additional systemic therapy but received adjuvant radiotherapy and has continued antiestrogen therapy.   While continuing in surveillance for her history of left-sided lung cancer, a new mass was seen in her right upper lobe during a CT scan on 06/02/2023 measuring 3.1 cm in greatest dimension as well as a 2.4 cm right hilar and 2.7 cm right paratracheal lymph node.  A PET scan on 07/06/2023 showed widespread mediastinal and right hilar lymphadenopathy  with SUV ranges from 4.3-20.  No evidence of local disease was noted in the left upper lobectomy site, but in the right upper lobe the mass not measured 3.4 cm with an SUV of 21.3, an additional subpleural nodule in the right upper lobe was hypermetabolic with an SUV of 4.4, and linear uptake associated with the right upper lobe pulmonary artery with irregular nodular changes noted with an SUV of 6.6.  There was mild uptake in a level 1 lymph node in the left axilla presumably reactive and a second left axillary lymph node with an SUV of 2.2.  She also had bilateral inguinal lymphadenopathy measuring 3.1 cm on the left, and 2.9 cm on the left.  SUVs ranged from 2-10.  The patient had undergone bronchoscopy and a level 11R node was submitted and consistent with small cell carcinoma.  She also underwent a biopsy on 08/10/2023 of the left inguinal lymph node which was benign and reactive. An MRI of the brain on 06/11/24 was also negative.  Given these findings she is seen to consider chemoradiation.   PREVIOUS RADIATION THERAPY:   07/25/2018 - 09/10/2018: The patient initially received a dose of 50.4 Gy in 28 fractions to the right chest wall and supraclavicular region. This was delivered using a 3-D conformal, 4 field technique. The patient then received a boost to the mastectomy scar. This delivered an additional 10 Gy in 5 fractions using an en face electron field. The total dose was 60.4 Gy.   PAST MEDICAL HISTORY:  Past Medical History:  Diagnosis Date   Allergy    Anemia    Anxiety    Asthma    Cancer (HCC)    Cataract    Colitis    Depression    Diabetes mellitus without complication (HCC)    GERD (gastroesophageal reflux disease)    Heart murmur    Hypercholesteremia    Hypertension    Retinitis pigmentosa    Sleep apnea    doesnt wear BIPAP currently   Stroke (HCC) 04/2014   no residual       PAST SURGICAL HISTORY: Past Surgical History:  Procedure Laterality Date   BONE  GRAFT HIP ILIAC CREST     CATARACT EXTRACTION     COLONOSCOPY     IUD REMOVAL     LOBECTOMY Left 02/24/2017   Procedure: Left upper lobe LOBECTOMY;  Surgeon: Loreli Slot, MD;  Location: [facility] OR;  Service: Thoracic;  Laterality: Left;   NODE DISSECTION Left 02/24/2017   Procedure: NODE DISSECTION;  Surgeon: Loreli Slot, MD;  Location: [facility] OR;  Service: Thoracic;  Laterality: Left;   OOPHORECTOMY Right 2004   removal of cyst and ovary   RETINAL CRYOABLATION Bilateral    patient unsure of whether she had this   VIDEO ASSISTED THORACOSCOPY (VATS)/WEDGE RESECTION Left 02/24/2017   Procedure: Left VIDEO ASSISTED THORACOSCOPY (VATS)/WEDGE RESECTION;  Surgeon: Loreli Slot, MD;  Location: [facility] OR;  Service: Thoracic;  Laterality: Left;     FAMILY HISTORY:  Family History  Adopted: Yes  Problem Relation Age of Onset   Diabetes Other        BIOLOGICAL AUNT   Heart disease Other        BIOLOGICAL AUNT   Colon cancer Neg Hx    Colon polyps Neg Hx    Stomach cancer Neg Hx      SOCIAL HISTORY:  reports that she has been smoking cigarettes. She started smoking about 46 years ago. She has a 80 pack-year smoking history. She has never used smokeless tobacco. She reports that she does not currently use alcohol. She reports that she does not use drugs. The patient is single and lives in [place]. She ***    ALLERGIES: Shellfish allergy, Hydrochlorothiazide, Lansoprazole, Metformin, Omeprazole, Petrolatum-zinc oxide, Pravastatin, Simvastatin, Tomato, Bupropion, Empagliflozin, and Pregabalin   MEDICATIONS:  Current Outpatient Medications  Medication Sig Dispense Refill   acetaminophen (TYLENOL) 500 MG tablet Take 2 tablets (1,000 mg total) by mouth every 6 (six) hours as needed. 30 tablet 0   amLODipine (NORVASC) 10 MG tablet Take 1 tablet (10 mg total) by mouth daily. 30 tablet 3   anastrozole (ARIMIDEX) 1 MG tablet      aspirin EC 325 MG EC tablet Take 1 tablet (325  mg total) by mouth daily. 30 tablet 0   cetirizine (ZYRTEC ALLERGY) 10 MG tablet Take 1 tablet (10 mg total) by mouth daily. 30 tablet 0   [employer] F[employer] GUMMIES PO Take 2 capsules by mouth daily.     dexamethasone (OZURDEX) 0.7 MG IMPL 0.7 mg by Intravitreal route every 3 (three) months.      docusate sodium (COLACE) 100 MG capsule Take 200 mg by mouth at bedtime.     dorzolamide (TRUSOPT) 2 % ophthalmic solution Place 1 drop into both eyes 2 (two) times daily.     EPINEPHrine 0.3 mg/0.3 mL IJ SOAJ injection      fluconazole (DIFLUCAN) 100 MG tablet 200 mg for 1 day then 100 mg for 13 days 15  tablet 0   fluticasone (FLONASE) 50 MCG/ACT nasal spray      hydrochlorothiazide (HYDRODIURIL) 25 MG tablet Take 25 mg by mouth daily.     lisinopril (PRINIVIL,ZESTRIL) 5 MG tablet Take 1 tablet (5 mg total) by mouth daily. 30 tablet 3   nystatin (MYCOSTATIN) 100000 UNIT/ML suspension      ondansetron (ZOFRAN) 8 MG tablet      pantoprazole (PROTONIX) 40 MG tablet Take 80 mg by mouth daily.      potassium chloride SA (K-DUR,KLOR-CON) 20 MEQ tablet Take 1 tablet (20 mEq total) by mouth daily. 30 tablet 3   QUEtiapine (SEROQUEL) 25 MG tablet Take 25 mg by mouth at bedtime.     rosuvastatin (CRESTOR) 20 MG tablet Take 20 mg by mouth every evening.     timolol (BETIMOL) 0.5 % ophthalmic solution Place 1 drop into both eyes 2 (two) times daily.     timolol (TIMOPTIC) 0.5 % ophthalmic solution AS DIRECTED INSTILL 1 DROP IN BOTH EYES TWICE A DAY  2   venlafaxine XR (EFFEXOR-XR) 37.5 MG 24 hr capsule Take 187.5 mg by mouth at bedtime.     VICTOZA 18 MG/3ML SOPN Inject into the skin.     No current facility-administered medications for this visit.     REVIEW OF SYSTEMS: On review of systems, the patient reports that she is doing well overall. She denies any chest pain, shortness of breath, cough, fevers, chills, night sweats, unintended weight changes. She denies any bowel or bladder disturbances, and denies  abdominal pain, nausea or vomiting. She denies any new musculoskeletal or joint aches or pains. A complete review of systems is obtained and is otherwise negative.     PHYSICAL EXAM:  Wt Readings from Last 3 Encounters:  08/16/22 226 lb 6.4 oz (102.7 kg)  08/15/22 226 lb (102.5 kg)  08/09/22 224 lb 6.4 oz (101.8 kg)   Temp Readings from Last 3 Encounters:  08/23/22 98.3 F (36.8 C) (Oral)  08/16/22 97.9 F (36.6 C) (Oral)  08/15/22 (!) 97.5 F (36.4 C)   BP Readings from Last 3 Encounters:  08/10/23 (!) 124/58  08/23/22 108/65  08/16/22 121/78   Pulse Readings from Last 3 Encounters:  08/23/22 71  08/16/22 77  08/15/22 86     In general this is a well appearing African American female in no acute distress. She is alert and oriented x4 and appropriate throughout the examination. HEENT reveals that the patient is normocephalic, atraumatic. EOMs are intact. Skin is intact without any evidence of gross lesions. Cardiopulmonary assessment is negative for acute distress and she exhibits normal effort. Bilateral chest wall incision sites are well healed. She has some tightening of the middle of her right mastectomy scar.  ECOG = 0  0 - Asymptomatic (Fully active, able to carry on all predisease activities without restriction)  1 - Symptomatic but completely ambulatory (Restricted in physically strenuous activity but ambulatory and able to carry out work of a light or sedentary nature. For example, light housework, office work)  2 - Symptomatic, <50% in bed during the day (Ambulatory and capable of all self care but unable to carry out any work activities. Up and about more than 50% of waking hours)  3 - Symptomatic, >50% in bed, but not bedbound (Capable of only limited self-care, confined to bed or chair 50% or more of waking hours)  4 - Bedbound (Completely disabled. Cannot carry on any self-care. Totally confined to bed or chair)  5 -  Death   Santiago Glad MM, Creech RH, Tormey DC,  et al. [phone]). "Toxicity and response criteria of the [facility]". Am. Evlyn Clines. Oncol. 5 (6): 649-55    LABORATORY DATA:  Lab Results  Component Value Date   WBC 8.2 08/02/2022   HGB 10.7 (L) 08/02/2022   HCT 37.4 08/02/2022   MCV 84.8 08/02/2022   PLT 362 08/02/2022   Lab Results  Component Value Date   NA 141 08/02/2022   K 3.7 08/02/2022   CL 104 08/02/2022   CO2 32 08/02/2022   Lab Results  Component Value Date   ALT 10 08/02/2022   AST 16 08/02/2022   ALKPHOS 72 08/02/2022   BILITOT 0.2 (L) 08/02/2022      RADIOGRAPHY: [place] CORE BIOPSY (LYMPH NODES) Result Date: 08/10/2023 INDICATION: History of lung cancer. Recent PET-CT demonstrates a right lung mass with chest lymphadenopathy and bilateral inguinal lymph node enlargement. Scheduled for inguinal lymph node biopsy. EXAM: ULTRASOUND-GUIDED BIOPSY OF LEFT INGUINAL LYMPH NODE MEDICATIONS: 1% lidocaine ANESTHESIA/SEDATION: None FLUOROSCOPY TIME:  None COMPLICATIONS: None immediate. PROCEDURE: Informed written consent was obtained from the patient after a thorough discussion of the procedural risks, benefits and alternatives. All questions were addressed. A timeout was performed prior to the initiation of the procedure. Both groins were evaluated with ultrasound. Enlarged left inguinal lymph node was targeted for biopsy. Left groin was prepped with chlorhexidine and sterile field was created. Skin was anesthetized using 1% lidocaine. Small incision was made. Using ultrasound guidance, a 17 gauge coaxial needle was directed into the enlarged left inguinal lymph node. Four core biopsies were obtained with an 18 gauge core device. Specimens placed in saline. 17 gauge needle was removed without complication. Bandage placed over the puncture site. FINDINGS: Enlarged bilateral inguinal lymph nodes. Core biopsy needle confirmed within the enlarged left inguinal lymph node. No immediate bleeding or hematoma formation.  IMPRESSION: Successful ultrasound-guided core biopsy of an enlarged left inguinal lymph node. Electronically Signed   By: Richarda Overlie M.D.   On: 08/10/2023 14:00       IMPRESSION/PLAN: 1. Limited Stage Small Cell Carcinoma of the  2. 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 Dr. Mitzi Hansen discusses the pathology findings and reviews the nature of right breast disease. She has completed her systemic therapy and her surgical healing. We discussed the rationale for radiotherapy. She is ready to proceed with adjuvant external radiotherapy to the breast followed by antiestrogen therapy. We discussed the risks, benefits, short, and long term effects of radiotherapy, and the patient is interested in proceeding. Dr. Mitzi Hansen discusses the delivery and logistics of radiotherapy and anticipates a course of 6 1/2 weeks of radiotherapy. We will see her back about 2 weeks after surgery to discuss the simulation process and anticipate we starting radiotherapy about 4-6 weeks after surgery.  2. Right skin tightening of mastectomy scar. I encouraged the patient to be seen by PT due to the tightening of her skin which I'm concerned could increase the risks of desquamation during radiotherapy. She will be referred for evaluation.   In a visit lasting 60 minutes, greater than 50% of the time was spent face to face discussing her case, and coordinating the patient's care.  The above documentation reflects my direct findings during this shared patient visit. Please see the separate note by Dr. Mitzi Hansen on this date for the remainder of the patient's plan of care.    Osker Mason, PAC

## 2023-08-14 NOTE — Progress Notes (Signed)
 Thoracic Location of Tumor / Histology: RUL Lung  Patient presented for surveillance scans for Lung cancer.  PET 07/06/2023: Continued interval growth of a dominant right upper lobe subpleural mass with bulky right hilar and mediastinal lymphadenopathy, and hyermetabolic subpleural satellite nodules, suggestive of primary lung neoplasm with regional nodal metastases.  There was also diffuse mild FDG uptake in the esophagus and intensely hpermetabolic bilateral inguinal lymphadenopathy.  MRI Brain 06/12/2023: No intracranial metastasis.   Biopsies of Inguinal node 08/10/2023   Past/Anticipated interventions by cardiothoracic surgery, if any:   Past/Anticipated interventions by medical oncology, if any:   Past/Anticipated interventions by radiation oncology, if any:  Dr. 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   Tobacco/Marijuana/Snuff/ETOH use:   Signs/Symptoms Weight changes, if any:  Respiratory complaints, if any:  Hemoptysis, if any:  Pain issues, if any:    SAFETY ISSUES: Prior radiation? Right CW and SCLV was treated to 50.4 Gy in 28 fractions with an additional boost to the mastectomy scar of 10 Gy in 5 fractions 2/5-3/23/2020 with Dr. Mitzi Hansen. Pacemaker/ICD?   Possible current pregnancy? Postmenopausal Is the patient on methotrexate?   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

## 2023-08-14 NOTE — Telephone Encounter (Signed)
 2/24 Follow up call to [place], spoke to [place].  All images are not uploaded to PACs in epic as requested.  Waiting on images.

## 2023-08-15 ENCOUNTER — Ambulatory Visit
Admission: RE | Admit: 2023-08-15 | Discharge: 2023-08-15 | Disposition: A | Discharge status: Home / Self Care | Payer: Federal, State, Local not specified - PPO | Source: Ambulatory Visit | Attending: Radiation Oncology | Admitting: Radiation Oncology

## 2023-08-15 ENCOUNTER — Other Ambulatory Visit: Payer: Self-pay

## 2023-08-15 ENCOUNTER — Encounter: Payer: Self-pay | Admitting: Radiation Oncology

## 2023-08-15 VITALS — Ht 64.0 in | Wt 220.0 lb

## 2023-08-15 DIAGNOSIS — C50411 Malignant neoplasm of upper-outer quadrant of right female breast: Secondary | ICD-10-CM

## 2023-08-15 DIAGNOSIS — C3411 Malignant neoplasm of upper lobe, right bronchus or lung: Secondary | ICD-10-CM

## 2023-08-15 DIAGNOSIS — C3492 Malignant neoplasm of unspecified part of left bronchus or lung: Secondary | ICD-10-CM

## 2023-08-16 ENCOUNTER — Ambulatory Visit
Admission: RE | Admit: 2023-08-16 | Discharge: 2023-08-16 | Disposition: A | Discharge status: Home / Self Care | Payer: Federal, State, Local not specified - PPO | Source: Ambulatory Visit | Attending: Radiation Oncology | Admitting: Radiation Oncology

## 2023-08-16 DIAGNOSIS — C3411 Malignant neoplasm of upper lobe, right bronchus or lung: Secondary | ICD-10-CM | POA: Diagnosis present

## 2023-08-16 DIAGNOSIS — Z510 Encounter for antineoplastic radiation therapy: Secondary | ICD-10-CM | POA: Diagnosis present

## 2023-08-18 DIAGNOSIS — Z510 Encounter for antineoplastic radiation therapy: Secondary | ICD-10-CM | POA: Diagnosis not present

## 2023-08-21 ENCOUNTER — Ambulatory Visit
Admission: RE | Admit: 2023-08-21 | Discharge: 2023-08-21 | Disposition: A | Discharge status: Home / Self Care | Payer: Federal, State, Local not specified - PPO | Source: Ambulatory Visit | Attending: Radiation Oncology | Admitting: Radiation Oncology

## 2023-08-21 ENCOUNTER — Other Ambulatory Visit: Payer: Self-pay

## 2023-08-21 DIAGNOSIS — R911 Solitary pulmonary nodule: Secondary | ICD-10-CM | POA: Insufficient documentation

## 2023-08-21 DIAGNOSIS — D0221 Carcinoma in situ of right bronchus and lung: Secondary | ICD-10-CM | POA: Diagnosis present

## 2023-08-21 LAB — RAD ONC ARIA SESSION SUMMARY
Course Elapsed Days: 0
Plan Fractions Treated to Date: 1
Plan Prescribed Dose Per Fraction: 2 Gy
Plan Total Fractions Prescribed: 30
Plan Total Prescribed Dose: 60 Gy
Reference Point Dosage Given to Date: 2 Gy
Reference Point Session Dosage Given: 2 Gy
Session Number: 1

## 2023-08-22 ENCOUNTER — Encounter: Payer: Federal, State, Local not specified - PPO | Admitting: Thoracic Surgery (Cardiothoracic Vascular Surgery)

## 2023-08-22 ENCOUNTER — Ambulatory Visit
Admission: RE | Admit: 2023-08-22 | Discharge: 2023-08-22 | Disposition: A | Discharge status: Home / Self Care | Payer: Federal, State, Local not specified - PPO | Source: Ambulatory Visit | Attending: Radiation Oncology | Admitting: Radiation Oncology

## 2023-08-22 ENCOUNTER — Other Ambulatory Visit: Payer: Self-pay

## 2023-08-22 DIAGNOSIS — R911 Solitary pulmonary nodule: Secondary | ICD-10-CM | POA: Diagnosis not present

## 2023-08-22 LAB — RAD ONC ARIA SESSION SUMMARY
Course Elapsed Days: 1
Plan Fractions Treated to Date: 2
Plan Prescribed Dose Per Fraction: 2 Gy
Plan Total Fractions Prescribed: 30
Plan Total Prescribed Dose: 60 Gy
Reference Point Dosage Given to Date: 4 Gy
Reference Point Session Dosage Given: 2 Gy
Session Number: 2

## 2023-08-23 ENCOUNTER — Other Ambulatory Visit: Payer: Self-pay

## 2023-08-23 ENCOUNTER — Ambulatory Visit
Admission: RE | Admit: 2023-08-23 | Discharge: 2023-08-23 | Disposition: A | Discharge status: Home / Self Care | Payer: Federal, State, Local not specified - PPO | Source: Ambulatory Visit | Attending: Radiation Oncology | Admitting: Radiation Oncology

## 2023-08-23 DIAGNOSIS — R911 Solitary pulmonary nodule: Secondary | ICD-10-CM | POA: Diagnosis not present

## 2023-08-23 LAB — RAD ONC ARIA SESSION SUMMARY
Course Elapsed Days: 2
Plan Fractions Treated to Date: 3
Plan Prescribed Dose Per Fraction: 2 Gy
Plan Total Fractions Prescribed: 30
Plan Total Prescribed Dose: 60 Gy
Reference Point Dosage Given to Date: 6 Gy
Reference Point Session Dosage Given: 2 Gy
Session Number: 3

## 2023-08-24 ENCOUNTER — Ambulatory Visit
Admission: RE | Admit: 2023-08-24 | Discharge: 2023-08-24 | Disposition: A | Discharge status: Home / Self Care | Payer: Federal, State, Local not specified - PPO | Source: Ambulatory Visit | Attending: Radiation Oncology | Admitting: Radiation Oncology

## 2023-08-24 ENCOUNTER — Other Ambulatory Visit: Payer: Self-pay

## 2023-08-24 DIAGNOSIS — R911 Solitary pulmonary nodule: Secondary | ICD-10-CM | POA: Diagnosis not present

## 2023-08-24 LAB — RAD ONC ARIA SESSION SUMMARY
Course Elapsed Days: 3
Plan Fractions Treated to Date: 4
Plan Prescribed Dose Per Fraction: 2 Gy
Plan Total Fractions Prescribed: 30
Plan Total Prescribed Dose: 60 Gy
Reference Point Dosage Given to Date: 8 Gy
Reference Point Session Dosage Given: 2 Gy
Session Number: 4

## 2023-08-25 ENCOUNTER — Ambulatory Visit
Admission: RE | Admit: 2023-08-25 | Discharge: 2023-08-25 | Disposition: A | Discharge status: Home / Self Care | Source: Ambulatory Visit | Attending: Radiation Oncology | Admitting: Radiation Oncology

## 2023-08-25 ENCOUNTER — Ambulatory Visit
Admission: RE | Admit: 2023-08-25 | Discharge: 2023-08-25 | Disposition: A | Discharge status: Home / Self Care | Payer: Federal, State, Local not specified - PPO | Source: Ambulatory Visit | Attending: Radiation Oncology | Admitting: Radiation Oncology

## 2023-08-25 ENCOUNTER — Other Ambulatory Visit: Payer: Self-pay

## 2023-08-25 DIAGNOSIS — D0221 Carcinoma in situ of right bronchus and lung: Secondary | ICD-10-CM

## 2023-08-25 DIAGNOSIS — R911 Solitary pulmonary nodule: Secondary | ICD-10-CM

## 2023-08-25 LAB — RAD ONC ARIA SESSION SUMMARY
Course Elapsed Days: 4
Plan Fractions Treated to Date: 5
Plan Prescribed Dose Per Fraction: 2 Gy
Plan Total Fractions Prescribed: 30
Plan Total Prescribed Dose: 60 Gy
Reference Point Dosage Given to Date: 10 Gy
Reference Point Session Dosage Given: 2 Gy
Session Number: 5

## 2023-08-25 MED ORDER — RADIAPLEXRX EX GEL: Freq: Once | CUTANEOUS | Status: AC

## 2023-08-28 ENCOUNTER — Other Ambulatory Visit: Payer: Self-pay

## 2023-08-28 ENCOUNTER — Ambulatory Visit
Admission: RE | Admit: 2023-08-28 | Discharge: 2023-08-28 | Disposition: A | Discharge status: Home / Self Care | Payer: Federal, State, Local not specified - PPO | Source: Ambulatory Visit | Attending: Radiation Oncology | Admitting: Radiation Oncology

## 2023-08-28 DIAGNOSIS — R911 Solitary pulmonary nodule: Secondary | ICD-10-CM | POA: Diagnosis not present

## 2023-08-28 LAB — RAD ONC ARIA SESSION SUMMARY
Course Elapsed Days: 7
Plan Fractions Treated to Date: 6
Plan Prescribed Dose Per Fraction: 2 Gy
Plan Total Fractions Prescribed: 30
Plan Total Prescribed Dose: 60 Gy
Reference Point Dosage Given to Date: 12 Gy
Reference Point Session Dosage Given: 2 Gy
Session Number: 6

## 2023-08-29 ENCOUNTER — Encounter: Payer: Federal, State, Local not specified - PPO | Admitting: Thoracic Surgery (Cardiothoracic Vascular Surgery)

## 2023-08-29 ENCOUNTER — Other Ambulatory Visit: Payer: Self-pay

## 2023-08-29 ENCOUNTER — Ambulatory Visit
Admission: RE | Admit: 2023-08-29 | Discharge: 2023-08-29 | Disposition: A | Discharge status: Home / Self Care | Payer: Federal, State, Local not specified - PPO | Source: Ambulatory Visit | Attending: Radiation Oncology | Admitting: Radiation Oncology

## 2023-08-29 DIAGNOSIS — R911 Solitary pulmonary nodule: Secondary | ICD-10-CM | POA: Diagnosis not present

## 2023-08-29 LAB — RAD ONC ARIA SESSION SUMMARY
Course Elapsed Days: 8
Plan Fractions Treated to Date: 7
Plan Prescribed Dose Per Fraction: 2 Gy
Plan Total Fractions Prescribed: 30
Plan Total Prescribed Dose: 60 Gy
Reference Point Dosage Given to Date: 14 Gy
Reference Point Session Dosage Given: 2 Gy
Session Number: 7

## 2023-08-30 ENCOUNTER — Ambulatory Visit
Admission: RE | Admit: 2023-08-30 | Discharge: 2023-08-30 | Disposition: A | Discharge status: Home / Self Care | Payer: Federal, State, Local not specified - PPO | Source: Ambulatory Visit | Attending: Radiation Oncology | Admitting: Radiation Oncology

## 2023-08-30 ENCOUNTER — Other Ambulatory Visit: Payer: Self-pay

## 2023-08-30 DIAGNOSIS — R911 Solitary pulmonary nodule: Secondary | ICD-10-CM | POA: Diagnosis not present

## 2023-08-30 LAB — RAD ONC ARIA SESSION SUMMARY
Course Elapsed Days: 9
Plan Fractions Treated to Date: 8
Plan Prescribed Dose Per Fraction: 2 Gy
Plan Total Fractions Prescribed: 30
Plan Total Prescribed Dose: 60 Gy
Reference Point Dosage Given to Date: 16 Gy
Reference Point Session Dosage Given: 2 Gy
Session Number: 8

## 2023-08-31 ENCOUNTER — Ambulatory Visit
Admission: RE | Admit: 2023-08-31 | Discharge: 2023-08-31 | Disposition: A | Discharge status: Home / Self Care | Payer: Federal, State, Local not specified - PPO | Source: Ambulatory Visit | Attending: Radiation Oncology | Admitting: Radiation Oncology

## 2023-08-31 ENCOUNTER — Other Ambulatory Visit: Payer: Self-pay

## 2023-08-31 ENCOUNTER — Other Ambulatory Visit (HOSPITAL_COMMUNITY): Payer: Self-pay | Admitting: Internal Medicine

## 2023-08-31 DIAGNOSIS — R911 Solitary pulmonary nodule: Secondary | ICD-10-CM | POA: Diagnosis not present

## 2023-08-31 DIAGNOSIS — C3490 Malignant neoplasm of unspecified part of unspecified bronchus or lung: Secondary | ICD-10-CM

## 2023-08-31 LAB — RAD ONC ARIA SESSION SUMMARY
Course Elapsed Days: 10
Plan Fractions Treated to Date: 9
Plan Prescribed Dose Per Fraction: 2 Gy
Plan Total Fractions Prescribed: 30
Plan Total Prescribed Dose: 60 Gy
Reference Point Dosage Given to Date: 18 Gy
Reference Point Session Dosage Given: 2 Gy
Session Number: 9

## 2023-09-01 ENCOUNTER — Other Ambulatory Visit: Payer: Self-pay

## 2023-09-01 ENCOUNTER — Ambulatory Visit
Admission: RE | Admit: 2023-09-01 | Discharge: 2023-09-01 | Disposition: A | Discharge status: Home / Self Care | Source: Ambulatory Visit | Attending: Radiation Oncology | Admitting: Radiation Oncology

## 2023-09-01 ENCOUNTER — Ambulatory Visit
Admission: RE | Admit: 2023-09-01 | Discharge: 2023-09-01 | Disposition: A | Discharge status: Home / Self Care | Payer: Federal, State, Local not specified - PPO | Source: Ambulatory Visit | Attending: Radiation Oncology | Admitting: Radiation Oncology

## 2023-09-01 DIAGNOSIS — R911 Solitary pulmonary nodule: Secondary | ICD-10-CM | POA: Diagnosis not present

## 2023-09-01 LAB — RAD ONC ARIA SESSION SUMMARY
Course Elapsed Days: 11
Plan Fractions Treated to Date: 10
Plan Prescribed Dose Per Fraction: 2 Gy
Plan Total Fractions Prescribed: 30
Plan Total Prescribed Dose: 60 Gy
Reference Point Dosage Given to Date: 20 Gy
Reference Point Session Dosage Given: 2 Gy
Session Number: 10

## 2023-09-04 ENCOUNTER — Ambulatory Visit
Admission: RE | Admit: 2023-09-04 | Discharge: 2023-09-04 | Disposition: A | Discharge status: Home / Self Care | Payer: Federal, State, Local not specified - PPO | Source: Ambulatory Visit | Attending: Radiation Oncology | Admitting: Radiation Oncology

## 2023-09-04 ENCOUNTER — Other Ambulatory Visit: Payer: Self-pay

## 2023-09-04 ENCOUNTER — Other Ambulatory Visit: Payer: Self-pay | Admitting: Radiation Oncology

## 2023-09-04 DIAGNOSIS — R911 Solitary pulmonary nodule: Secondary | ICD-10-CM | POA: Diagnosis not present

## 2023-09-04 LAB — RAD ONC ARIA SESSION SUMMARY
Course Elapsed Days: 14
Plan Fractions Treated to Date: 11
Plan Prescribed Dose Per Fraction: 2 Gy
Plan Total Fractions Prescribed: 30
Plan Total Prescribed Dose: 60 Gy
Reference Point Dosage Given to Date: 22 Gy
Reference Point Session Dosage Given: 2 Gy
Session Number: 11

## 2023-09-04 MED ORDER — SUCRALFATE 1 G PO TABS: 1.0000 g | ORAL_TABLET | Freq: Three times a day (TID) | ORAL | 2 refills | Status: DC

## 2023-09-04 NOTE — Progress Notes (Unsigned)
 Pt's NN at [facility], Sue Lush, called me and mentioned that this pt is a lung patient receiving her chemotherapy at the [place], but radiation at our location. She mentioned that the pt was c/o painful swallowing and she was told by her medical oncologist to mention it to her radiation oncologist. I checked the pt's EMR and she currently does not have an encounter with Dr Mitzi Hansen or Jill Side scheduled. I sent a [place] message to Dr Joellen Jersey nurse, Reatha Harps, RN, and requested the pts concerns be shared with her Rad Onc providers so her symptoms can be addressed today at her radiation treatment appt at 3:45. Confirmation that [place] message was received by [place].

## 2023-09-05 ENCOUNTER — Ambulatory Visit
Admission: RE | Admit: 2023-09-05 | Discharge: 2023-09-05 | Disposition: A | Discharge status: Home / Self Care | Payer: Federal, State, Local not specified - PPO | Source: Ambulatory Visit | Attending: Radiation Oncology | Admitting: Radiation Oncology

## 2023-09-05 ENCOUNTER — Other Ambulatory Visit: Payer: Self-pay

## 2023-09-05 DIAGNOSIS — R911 Solitary pulmonary nodule: Secondary | ICD-10-CM | POA: Diagnosis not present

## 2023-09-05 LAB — RAD ONC ARIA SESSION SUMMARY
Course Elapsed Days: 15
Plan Fractions Treated to Date: 12
Plan Prescribed Dose Per Fraction: 2 Gy
Plan Total Fractions Prescribed: 30
Plan Total Prescribed Dose: 60 Gy
Reference Point Dosage Given to Date: 24 Gy
Reference Point Session Dosage Given: 2 Gy
Session Number: 12

## 2023-09-06 ENCOUNTER — Ambulatory Visit
Admission: RE | Admit: 2023-09-06 | Discharge: 2023-09-06 | Disposition: A | Discharge status: Home / Self Care | Payer: Federal, State, Local not specified - PPO | Source: Ambulatory Visit | Attending: Radiation Oncology | Admitting: Radiation Oncology

## 2023-09-06 ENCOUNTER — Other Ambulatory Visit: Payer: Self-pay

## 2023-09-06 DIAGNOSIS — R911 Solitary pulmonary nodule: Secondary | ICD-10-CM | POA: Diagnosis not present

## 2023-09-06 LAB — RAD ONC ARIA SESSION SUMMARY
Course Elapsed Days: 16
Plan Fractions Treated to Date: 13
Plan Prescribed Dose Per Fraction: 2 Gy
Plan Total Fractions Prescribed: 30
Plan Total Prescribed Dose: 60 Gy
Reference Point Dosage Given to Date: 26 Gy
Reference Point Session Dosage Given: 2 Gy
Session Number: 13

## 2023-09-07 ENCOUNTER — Ambulatory Visit
Admission: RE | Admit: 2023-09-07 | Discharge: 2023-09-07 | Disposition: A | Discharge status: Home / Self Care | Source: Ambulatory Visit | Attending: Radiation Oncology | Admitting: Radiation Oncology

## 2023-09-07 ENCOUNTER — Ambulatory Visit
Admission: RE | Admit: 2023-09-07 | Discharge: 2023-09-07 | Disposition: A | Discharge status: Home / Self Care | Payer: Federal, State, Local not specified - PPO | Source: Ambulatory Visit | Attending: Radiation Oncology | Admitting: Radiation Oncology

## 2023-09-07 ENCOUNTER — Other Ambulatory Visit: Payer: Self-pay

## 2023-09-07 DIAGNOSIS — R911 Solitary pulmonary nodule: Secondary | ICD-10-CM | POA: Diagnosis not present

## 2023-09-07 LAB — RAD ONC ARIA SESSION SUMMARY
Course Elapsed Days: 17
Plan Fractions Treated to Date: 14
Plan Prescribed Dose Per Fraction: 2 Gy
Plan Total Fractions Prescribed: 30
Plan Total Prescribed Dose: 60 Gy
Reference Point Dosage Given to Date: 28 Gy
Reference Point Session Dosage Given: 2 Gy
Session Number: 14

## 2023-09-08 ENCOUNTER — Other Ambulatory Visit: Payer: Self-pay

## 2023-09-08 ENCOUNTER — Ambulatory Visit
Admission: RE | Admit: 2023-09-08 | Discharge: 2023-09-08 | Disposition: A | Discharge status: Home / Self Care | Payer: Federal, State, Local not specified - PPO | Source: Ambulatory Visit | Attending: Radiation Oncology | Admitting: Radiation Oncology

## 2023-09-08 DIAGNOSIS — R911 Solitary pulmonary nodule: Secondary | ICD-10-CM | POA: Diagnosis not present

## 2023-09-08 LAB — RAD ONC ARIA SESSION SUMMARY
Course Elapsed Days: 18
Plan Fractions Treated to Date: 15
Plan Prescribed Dose Per Fraction: 2 Gy
Plan Total Fractions Prescribed: 30
Plan Total Prescribed Dose: 60 Gy
Reference Point Dosage Given to Date: 30 Gy
Reference Point Session Dosage Given: 2 Gy
Session Number: 15

## 2023-09-11 ENCOUNTER — Ambulatory Visit
Admission: RE | Admit: 2023-09-11 | Discharge: 2023-09-11 | Disposition: A | Discharge status: Home / Self Care | Payer: Federal, State, Local not specified - PPO | Source: Ambulatory Visit | Attending: Radiation Oncology

## 2023-09-11 ENCOUNTER — Telehealth: Payer: Self-pay | Admitting: Radiation Oncology

## 2023-09-11 ENCOUNTER — Other Ambulatory Visit: Payer: Self-pay

## 2023-09-11 DIAGNOSIS — R911 Solitary pulmonary nodule: Secondary | ICD-10-CM | POA: Diagnosis not present

## 2023-09-11 LAB — RAD ONC ARIA SESSION SUMMARY
Course Elapsed Days: 21
Plan Fractions Treated to Date: 16
Plan Prescribed Dose Per Fraction: 2 Gy
Plan Total Fractions Prescribed: 30
Plan Total Prescribed Dose: 60 Gy
Reference Point Dosage Given to Date: 32 Gy
Reference Point Session Dosage Given: 2 Gy
Session Number: 16

## 2023-09-11 NOTE — Telephone Encounter (Signed)
 Pt called to advise needing schedule change due to chemo appts. Email sent to L4 and Support RTT team to advise of this request.

## 2023-09-12 ENCOUNTER — Ambulatory Visit
Admission: RE | Admit: 2023-09-12 | Discharge: 2023-09-12 | Disposition: A | Discharge status: Home / Self Care | Payer: Federal, State, Local not specified - PPO | Source: Ambulatory Visit | Attending: Radiation Oncology | Admitting: Radiation Oncology

## 2023-09-12 ENCOUNTER — Other Ambulatory Visit: Payer: Self-pay

## 2023-09-12 DIAGNOSIS — R911 Solitary pulmonary nodule: Secondary | ICD-10-CM | POA: Diagnosis not present

## 2023-09-12 LAB — RAD ONC ARIA SESSION SUMMARY
Course Elapsed Days: 22
Plan Fractions Treated to Date: 17
Plan Prescribed Dose Per Fraction: 2 Gy
Plan Total Fractions Prescribed: 30
Plan Total Prescribed Dose: 60 Gy
Reference Point Dosage Given to Date: 34 Gy
Reference Point Session Dosage Given: 2 Gy
Session Number: 17

## 2023-09-13 ENCOUNTER — Ambulatory Visit
Admission: RE | Admit: 2023-09-13 | Discharge: 2023-09-13 | Disposition: A | Discharge status: Home / Self Care | Payer: Federal, State, Local not specified - PPO | Source: Ambulatory Visit | Attending: Radiation Oncology | Admitting: Radiation Oncology

## 2023-09-13 ENCOUNTER — Other Ambulatory Visit: Payer: Self-pay

## 2023-09-13 DIAGNOSIS — R911 Solitary pulmonary nodule: Secondary | ICD-10-CM | POA: Diagnosis not present

## 2023-09-13 LAB — RAD ONC ARIA SESSION SUMMARY
Course Elapsed Days: 23
Plan Fractions Treated to Date: 18
Plan Prescribed Dose Per Fraction: 2 Gy
Plan Total Fractions Prescribed: 30
Plan Total Prescribed Dose: 60 Gy
Reference Point Dosage Given to Date: 36 Gy
Reference Point Session Dosage Given: 2 Gy
Session Number: 18

## 2023-09-14 ENCOUNTER — Ambulatory Visit
Admission: RE | Admit: 2023-09-14 | Discharge: 2023-09-14 | Discharge status: Home / Self Care | Payer: Federal, State, Local not specified - PPO | Source: Ambulatory Visit | Attending: Radiation Oncology

## 2023-09-14 ENCOUNTER — Other Ambulatory Visit: Payer: Self-pay

## 2023-09-14 DIAGNOSIS — R911 Solitary pulmonary nodule: Secondary | ICD-10-CM | POA: Diagnosis not present

## 2023-09-14 LAB — RAD ONC ARIA SESSION SUMMARY
Course Elapsed Days: 24
Plan Fractions Treated to Date: 19
Plan Prescribed Dose Per Fraction: 2 Gy
Plan Total Fractions Prescribed: 30
Plan Total Prescribed Dose: 60 Gy
Reference Point Dosage Given to Date: 38 Gy
Reference Point Session Dosage Given: 2 Gy
Session Number: 19

## 2023-09-15 ENCOUNTER — Ambulatory Visit (HOSPITAL_COMMUNITY)
Admission: RE | Admit: 2023-09-15 | Discharge: 2023-09-15 | Disposition: A | Discharge status: Home / Self Care | Source: Ambulatory Visit | Attending: Internal Medicine | Admitting: Internal Medicine

## 2023-09-15 ENCOUNTER — Ambulatory Visit
Admission: RE | Admit: 2023-09-15 | Discharge: 2023-09-15 | Disposition: A | Discharge status: Home / Self Care | Source: Ambulatory Visit | Attending: Radiation Oncology | Admitting: Radiation Oncology

## 2023-09-15 ENCOUNTER — Ambulatory Visit
Admission: RE | Admit: 2023-09-15 | Discharge: 2023-09-15 | Disposition: A | Discharge status: Home / Self Care | Payer: Federal, State, Local not specified - PPO | Source: Ambulatory Visit | Attending: Radiation Oncology | Admitting: Radiation Oncology

## 2023-09-15 ENCOUNTER — Other Ambulatory Visit: Payer: Self-pay

## 2023-09-15 DIAGNOSIS — C3490 Malignant neoplasm of unspecified part of unspecified bronchus or lung: Secondary | ICD-10-CM | POA: Diagnosis present

## 2023-09-15 DIAGNOSIS — R911 Solitary pulmonary nodule: Secondary | ICD-10-CM | POA: Diagnosis not present

## 2023-09-15 LAB — RAD ONC ARIA SESSION SUMMARY
Course Elapsed Days: 25
Plan Fractions Treated to Date: 20
Plan Prescribed Dose Per Fraction: 2 Gy
Plan Total Fractions Prescribed: 30
Plan Total Prescribed Dose: 60 Gy
Reference Point Dosage Given to Date: 40 Gy
Reference Point Session Dosage Given: 2 Gy
Session Number: 20

## 2023-09-15 LAB — POCT I-STAT CREATININE: Creatinine, Ser: 1.1 mg/dL — ABNORMAL HIGH (ref 0.44–1.00)

## 2023-09-15 MED ORDER — IOHEXOL 300 MG/ML  SOLN
100.0000 mL | Freq: Once | INTRAMUSCULAR | Status: AC | PRN
  Administered 2023-09-15: 100 mL via INTRAVENOUS

## 2023-09-18 ENCOUNTER — Ambulatory Visit
Admission: RE | Admit: 2023-09-18 | Discharge: 2023-09-18 | Disposition: A | Discharge status: Home / Self Care | Payer: Federal, State, Local not specified - PPO | Source: Ambulatory Visit | Attending: Radiation Oncology

## 2023-09-18 ENCOUNTER — Other Ambulatory Visit: Payer: Self-pay

## 2023-09-18 ENCOUNTER — Encounter (HOSPITAL_COMMUNITY): Payer: Self-pay

## 2023-09-18 ENCOUNTER — Other Ambulatory Visit (HOSPITAL_COMMUNITY): Payer: Federal, State, Local not specified - PPO

## 2023-09-18 DIAGNOSIS — R911 Solitary pulmonary nodule: Secondary | ICD-10-CM | POA: Diagnosis not present

## 2023-09-18 LAB — RAD ONC ARIA SESSION SUMMARY
Course Elapsed Days: 28
Plan Fractions Treated to Date: 21
Plan Prescribed Dose Per Fraction: 2 Gy
Plan Total Fractions Prescribed: 30
Plan Total Prescribed Dose: 60 Gy
Reference Point Dosage Given to Date: 42 Gy
Reference Point Session Dosage Given: 2 Gy
Session Number: 21

## 2023-09-19 ENCOUNTER — Other Ambulatory Visit: Payer: Self-pay

## 2023-09-19 ENCOUNTER — Ambulatory Visit
Admission: RE | Admit: 2023-09-19 | Discharge: 2023-09-19 | Disposition: A | Discharge status: Home / Self Care | Payer: Federal, State, Local not specified - PPO | Source: Ambulatory Visit | Attending: Radiation Oncology | Admitting: Radiation Oncology

## 2023-09-19 DIAGNOSIS — D0221 Carcinoma in situ of right bronchus and lung: Secondary | ICD-10-CM | POA: Insufficient documentation

## 2023-09-19 DIAGNOSIS — R911 Solitary pulmonary nodule: Secondary | ICD-10-CM | POA: Diagnosis present

## 2023-09-19 LAB — RAD ONC ARIA SESSION SUMMARY
Course Elapsed Days: 29
Plan Fractions Treated to Date: 22
Plan Prescribed Dose Per Fraction: 2 Gy
Plan Total Fractions Prescribed: 30
Plan Total Prescribed Dose: 60 Gy
Reference Point Dosage Given to Date: 44 Gy
Reference Point Session Dosage Given: 2 Gy
Session Number: 22

## 2023-09-20 ENCOUNTER — Ambulatory Visit
Admission: RE | Admit: 2023-09-20 | Discharge: 2023-09-20 | Disposition: A | Discharge status: Home / Self Care | Payer: Federal, State, Local not specified - PPO | Source: Ambulatory Visit | Attending: Radiation Oncology | Admitting: Radiation Oncology

## 2023-09-20 ENCOUNTER — Other Ambulatory Visit: Payer: Self-pay

## 2023-09-20 DIAGNOSIS — R911 Solitary pulmonary nodule: Secondary | ICD-10-CM | POA: Diagnosis not present

## 2023-09-20 LAB — RAD ONC ARIA SESSION SUMMARY
Course Elapsed Days: 30
Plan Fractions Treated to Date: 23
Plan Prescribed Dose Per Fraction: 2 Gy
Plan Total Fractions Prescribed: 30
Plan Total Prescribed Dose: 60 Gy
Reference Point Dosage Given to Date: 46 Gy
Reference Point Session Dosage Given: 2 Gy
Session Number: 23

## 2023-09-21 ENCOUNTER — Other Ambulatory Visit: Payer: Self-pay

## 2023-09-21 ENCOUNTER — Ambulatory Visit
Admission: RE | Admit: 2023-09-21 | Discharge: 2023-09-21 | Disposition: A | Discharge status: Home / Self Care | Payer: Federal, State, Local not specified - PPO | Source: Ambulatory Visit | Attending: Radiation Oncology

## 2023-09-21 DIAGNOSIS — R911 Solitary pulmonary nodule: Secondary | ICD-10-CM | POA: Diagnosis not present

## 2023-09-21 LAB — RAD ONC ARIA SESSION SUMMARY
Course Elapsed Days: 31
Plan Fractions Treated to Date: 24
Plan Prescribed Dose Per Fraction: 2 Gy
Plan Total Fractions Prescribed: 30
Plan Total Prescribed Dose: 60 Gy
Reference Point Dosage Given to Date: 48 Gy
Reference Point Session Dosage Given: 2 Gy
Session Number: 24

## 2023-09-22 ENCOUNTER — Other Ambulatory Visit: Payer: Self-pay

## 2023-09-22 ENCOUNTER — Ambulatory Visit
Admission: RE | Admit: 2023-09-22 | Discharge: 2023-09-22 | Disposition: A | Discharge status: Home / Self Care | Payer: Federal, State, Local not specified - PPO | Source: Ambulatory Visit | Attending: Radiation Oncology | Admitting: Radiation Oncology

## 2023-09-22 ENCOUNTER — Ambulatory Visit
Admission: RE | Admit: 2023-09-22 | Discharge: 2023-09-22 | Disposition: A | Discharge status: Home / Self Care | Source: Ambulatory Visit | Attending: Radiation Oncology | Admitting: Radiation Oncology

## 2023-09-22 DIAGNOSIS — R911 Solitary pulmonary nodule: Secondary | ICD-10-CM | POA: Diagnosis not present

## 2023-09-22 LAB — RAD ONC ARIA SESSION SUMMARY
Course Elapsed Days: 32
Plan Fractions Treated to Date: 25
Plan Prescribed Dose Per Fraction: 2 Gy
Plan Total Fractions Prescribed: 30
Plan Total Prescribed Dose: 60 Gy
Reference Point Dosage Given to Date: 50 Gy
Reference Point Session Dosage Given: 2 Gy
Session Number: 25

## 2023-09-25 ENCOUNTER — Other Ambulatory Visit: Payer: Self-pay

## 2023-09-25 ENCOUNTER — Ambulatory Visit
Admission: RE | Admit: 2023-09-25 | Discharge: 2023-09-25 | Disposition: A | Discharge status: Home / Self Care | Payer: Federal, State, Local not specified - PPO | Source: Ambulatory Visit | Attending: Radiation Oncology | Admitting: Radiation Oncology

## 2023-09-25 DIAGNOSIS — R911 Solitary pulmonary nodule: Secondary | ICD-10-CM | POA: Diagnosis not present

## 2023-09-25 LAB — RAD ONC ARIA SESSION SUMMARY
Course Elapsed Days: 35
Plan Fractions Treated to Date: 26
Plan Prescribed Dose Per Fraction: 2 Gy
Plan Total Fractions Prescribed: 30
Plan Total Prescribed Dose: 60 Gy
Reference Point Dosage Given to Date: 52 Gy
Reference Point Session Dosage Given: 2 Gy
Session Number: 26

## 2023-09-26 ENCOUNTER — Ambulatory Visit
Admission: RE | Admit: 2023-09-26 | Discharge: 2023-09-26 | Disposition: A | Discharge status: Home / Self Care | Payer: Federal, State, Local not specified - PPO | Source: Ambulatory Visit | Attending: Radiation Oncology

## 2023-09-26 ENCOUNTER — Other Ambulatory Visit: Payer: Self-pay

## 2023-09-26 DIAGNOSIS — R911 Solitary pulmonary nodule: Secondary | ICD-10-CM | POA: Diagnosis not present

## 2023-09-26 LAB — RAD ONC ARIA SESSION SUMMARY
Course Elapsed Days: 36
Plan Fractions Treated to Date: 27
Plan Prescribed Dose Per Fraction: 2 Gy
Plan Total Fractions Prescribed: 30
Plan Total Prescribed Dose: 60 Gy
Reference Point Dosage Given to Date: 54 Gy
Reference Point Session Dosage Given: 2 Gy
Session Number: 27

## 2023-09-27 ENCOUNTER — Other Ambulatory Visit: Payer: Self-pay

## 2023-09-27 ENCOUNTER — Ambulatory Visit
Admission: RE | Admit: 2023-09-27 | Discharge: 2023-09-27 | Disposition: A | Discharge status: Home / Self Care | Payer: Federal, State, Local not specified - PPO | Source: Ambulatory Visit | Attending: Radiation Oncology

## 2023-09-27 ENCOUNTER — Encounter: Payer: Self-pay | Admitting: *Deleted

## 2023-09-27 DIAGNOSIS — R911 Solitary pulmonary nodule: Secondary | ICD-10-CM | POA: Diagnosis not present

## 2023-09-27 LAB — RAD ONC ARIA SESSION SUMMARY
Course Elapsed Days: 37
Plan Fractions Treated to Date: 28
Plan Prescribed Dose Per Fraction: 2 Gy
Plan Total Fractions Prescribed: 30
Plan Total Prescribed Dose: 60 Gy
Reference Point Dosage Given to Date: 56 Gy
Reference Point Session Dosage Given: 2 Gy
Session Number: 28

## 2023-09-28 ENCOUNTER — Ambulatory Visit
Admission: RE | Admit: 2023-09-28 | Discharge: 2023-09-28 | Discharge status: Home / Self Care | Payer: Federal, State, Local not specified - PPO | Source: Ambulatory Visit | Attending: Radiation Oncology

## 2023-09-28 ENCOUNTER — Other Ambulatory Visit: Payer: Self-pay

## 2023-09-28 DIAGNOSIS — R911 Solitary pulmonary nodule: Secondary | ICD-10-CM | POA: Diagnosis not present

## 2023-09-28 LAB — RAD ONC ARIA SESSION SUMMARY
Course Elapsed Days: 38
Plan Fractions Treated to Date: 29
Plan Prescribed Dose Per Fraction: 2 Gy
Plan Total Fractions Prescribed: 30
Plan Total Prescribed Dose: 60 Gy
Reference Point Dosage Given to Date: 58 Gy
Reference Point Session Dosage Given: 2 Gy
Session Number: 29

## 2023-09-29 ENCOUNTER — Ambulatory Visit
Admission: RE | Admit: 2023-09-29 | Discharge: 2023-09-29 | Disposition: A | Discharge status: Home / Self Care | Payer: Federal, State, Local not specified - PPO | Source: Ambulatory Visit | Attending: Radiation Oncology | Admitting: Radiation Oncology

## 2023-09-29 ENCOUNTER — Ambulatory Visit
Admission: RE | Admit: 2023-09-29 | Discharge: 2023-09-29 | Disposition: A | Discharge status: Home / Self Care | Source: Ambulatory Visit | Attending: Radiation Oncology | Admitting: Radiation Oncology

## 2023-09-29 ENCOUNTER — Other Ambulatory Visit: Payer: Self-pay

## 2023-09-29 DIAGNOSIS — R911 Solitary pulmonary nodule: Secondary | ICD-10-CM | POA: Diagnosis not present

## 2023-09-29 LAB — RAD ONC ARIA SESSION SUMMARY
Course Elapsed Days: 39
Plan Fractions Treated to Date: 30
Plan Prescribed Dose Per Fraction: 2 Gy
Plan Total Fractions Prescribed: 30
Plan Total Prescribed Dose: 60 Gy
Reference Point Dosage Given to Date: 60 Gy
Reference Point Session Dosage Given: 2 Gy
Session Number: 30

## 2023-10-02 ENCOUNTER — Ambulatory Visit: Payer: Federal, State, Local not specified - PPO

## 2023-10-02 NOTE — Radiation Completion Notes (Signed)
  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   Intent: Curative     ==========DELIVERED PLANS==========  First Treatment Date: 2023-08-21 Last Treatment Date: 2023-09-29   Plan Name: Lung_R Site: Lung, Right Technique: IMRT Mode: Photon Dose Per Fraction: 2 Gy Prescribed Dose (Delivered / Prescribed): 60 Gy / 60 Gy Prescribed Fxs (Delivered / Prescribed): 30 / 30     ==========ON TREATMENT VISIT DATES========== 2023-08-25, 2023-09-01, 2023-09-07, 2023-09-15, 2023-09-22, 2023-09-29    See weekly On Treatment Notes in Epic for details in the Media tab (listed as Progress notes on the On Treatment Visit Dates listed above). The patient tolerated radiation. She developed fatigue and anticipated esophagitis during therapy.  The patient will receive a call in about one month from the radiation oncology department. She will continue follow up with Dr. Penne Bowl at the [facility] and I will check in with her after her 10/26/23 appointment to review the discussion of prophylactic cranial irradiation.     Shelvia Dick, PAC

## 2023-10-03 ENCOUNTER — Ambulatory Visit: Payer: Federal, State, Local not specified - PPO

## 2023-10-04 ENCOUNTER — Ambulatory Visit: Payer: Federal, State, Local not specified - PPO

## 2023-10-26 NOTE — Progress Notes (Signed)
  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 (as documented in provider EOT note)  The patient was not available for call today. Voicemail left. Call marked complete.  The patient will continue follow up with Dr. Penne Bowl at the [facility] and was encouraged to call if she develops concerns or questions regarding radiation.   Avery Bodo, LPN

## 2023-10-30 ENCOUNTER — Encounter: Payer: Self-pay | Admitting: Radiation Oncology

## 2023-10-30 ENCOUNTER — Ambulatory Visit
Admission: RE | Admit: 2023-10-30 | Discharge: 2023-10-30 | Disposition: A | Discharge status: Home / Self Care | Source: Ambulatory Visit | Attending: Internal Medicine | Admitting: Internal Medicine

## 2023-10-30 NOTE — Progress Notes (Signed)
 I spoke with Susan Farley the navigator at the [facility] in [facility] and Susan Farley has communicated to Dr.  Penne Bowl that she does not desire PCI for her brain. Susan Farley and I talked about our surveillance strategy at our center and it looks like Dr. Penne Bowl plans to survey her. We will be available as needed.

## 2023-12-12 ENCOUNTER — Encounter: Payer: Self-pay | Admitting: Podiatry

## 2023-12-12 ENCOUNTER — Ambulatory Visit (INDEPENDENT_AMBULATORY_CARE_PROVIDER_SITE_OTHER): Admitting: Podiatry

## 2023-12-12 DIAGNOSIS — R918 Other nonspecific abnormal finding of lung field: Secondary | ICD-10-CM | POA: Insufficient documentation

## 2023-12-12 DIAGNOSIS — Z658 Other specified problems related to psychosocial circumstances: Secondary | ICD-10-CM | POA: Insufficient documentation

## 2023-12-12 DIAGNOSIS — I2693 Single subsegmental pulmonary embolism without acute cor pulmonale: Secondary | ICD-10-CM | POA: Insufficient documentation

## 2023-12-12 DIAGNOSIS — M7752 Other enthesopathy of left foot: Secondary | ICD-10-CM

## 2023-12-12 DIAGNOSIS — C3411 Malignant neoplasm of upper lobe, right bronchus or lung: Secondary | ICD-10-CM | POA: Insufficient documentation

## 2023-12-12 DIAGNOSIS — F329 Major depressive disorder, single episode, unspecified: Secondary | ICD-10-CM | POA: Insufficient documentation

## 2023-12-12 DIAGNOSIS — R3915 Urgency of urination: Secondary | ICD-10-CM | POA: Insufficient documentation

## 2023-12-12 DIAGNOSIS — D2372 Other benign neoplasm of skin of left lower limb, including hip: Secondary | ICD-10-CM

## 2023-12-12 DIAGNOSIS — C3491 Malignant neoplasm of unspecified part of right bronchus or lung: Secondary | ICD-10-CM | POA: Insufficient documentation

## 2023-12-12 DIAGNOSIS — C3490 Malignant neoplasm of unspecified part of unspecified bronchus or lung: Secondary | ICD-10-CM | POA: Insufficient documentation

## 2023-12-12 DIAGNOSIS — M25519 Pain in unspecified shoulder: Secondary | ICD-10-CM | POA: Insufficient documentation

## 2023-12-12 MED ORDER — DEXAMETHASONE SODIUM PHOSPHATE 120 MG/30ML IJ SOLN
2.0000 mg | Freq: Once | INTRAMUSCULAR | Status: AC
  Administered 2023-12-12: 2 mg via INTRA_ARTICULAR

## 2023-12-12 NOTE — Progress Notes (Signed)
 She presents today chief complaint of painful callus plantar aspect of the fifth metatarsal left foot.  She states that the other 1 has done well for the past 6 months.  Objective: No signs stable oriented x 3 palpable bursa beneath the fifth metatarsal head of the left foot with an overlying benign neoplastic hyperkeratotic lesion.  Assessment: Bursitis benign skin lesion.  Plan: I injected the bursitis today with dexamethasone  local anesthetic debrided the benign skin lesion placed padding we will follow-up with her in 6 months

## 2024-01-23 ENCOUNTER — Ambulatory Visit: Admitting: Podiatry

## 2024-01-30 ENCOUNTER — Ambulatory Visit (INDEPENDENT_AMBULATORY_CARE_PROVIDER_SITE_OTHER): Admitting: Podiatry

## 2024-01-30 ENCOUNTER — Encounter: Payer: Self-pay | Admitting: Podiatry

## 2024-01-30 DIAGNOSIS — M7752 Other enthesopathy of left foot: Secondary | ICD-10-CM

## 2024-01-30 DIAGNOSIS — D2372 Other benign neoplasm of skin of left lower limb, including hip: Secondary | ICD-10-CM | POA: Diagnosis not present

## 2024-01-30 MED ORDER — DEXAMETHASONE SODIUM PHOSPHATE 120 MG/30ML IJ SOLN
2.0000 mg | Freq: Once | INTRAMUSCULAR | Status: AC
  Administered 2024-01-30 (×2): 2 mg via INTRA_ARTICULAR

## 2024-01-31 NOTE — Progress Notes (Signed)
 She presents today chief complaint of painful callus plantar aspect of the fifth metatarsal left foot.  She states that the other 1 has done well for the past 6 months.  Objective: No signs stable oriented x 3 palpable bursa beneath the fifth metatarsal head of the left foot with an overlying benign neoplastic hyperkeratotic lesion.  Assessment: Bursitis benign skin lesion.  Plan: I injected the bursitis today with dexamethasone  local anesthetic debrided the benign skin lesion placed padding we will follow-up with her in 6 months also applied acid under occlusion of the left after 3 days before being washed off.

## 2024-02-05 DIAGNOSIS — C801 Malignant (primary) neoplasm, unspecified: Secondary | ICD-10-CM | POA: Insufficient documentation

## 2024-04-18 ENCOUNTER — Ambulatory Visit: Admitting: Podiatry

## 2024-05-01 ENCOUNTER — Ambulatory Visit: Admitting: Podiatry

## 2024-05-01 ENCOUNTER — Encounter: Payer: Self-pay | Admitting: Podiatry

## 2024-05-01 DIAGNOSIS — M79675 Pain in left toe(s): Secondary | ICD-10-CM

## 2024-05-01 DIAGNOSIS — M79674 Pain in right toe(s): Secondary | ICD-10-CM | POA: Diagnosis not present

## 2024-05-01 DIAGNOSIS — B351 Tinea unguium: Secondary | ICD-10-CM

## 2024-05-01 NOTE — Progress Notes (Addendum)
 This patient returns to my office for at risk foot care.  This patient requires this care by a professional since this patient will be at risk due to having type 2 diabetes and neuropathy.  This patient is unable to cut nails herself since the patient cannot reach her nails.These nails are painful walking and wearing shoes.   She also has painful callus on the outside of her left foot. This patient presents for at risk foot care today.  General Appearance  Alert, conversant and in no acute stress.  Vascular  Dorsalis pedis and posterior tibial  pulses are palpable  bilaterally.  Capillary return is within normal limits  bilaterally. Temperature is within normal limits  bilaterally.  Neurologic  Senn-Weinstein monofilament wire test within normal limits  bilaterally. Muscle power within normal limits bilaterally.  Nails Thick disfigured discolored nails with subungual debris  from hallux to fifth toes bilaterally. No evidence of bacterial infection or drainage bilaterally.  Orthopedic  No limitations of motion  feet .  No crepitus or effusions noted.  No bony pathology or digital deformities noted.  Skin  normotropic skin with no porokeratosis noted bilaterally.  No signs of infections or ulcers noted.   Callus sub 5th met left foot.  Onychomycosis  Pain in right toes  Pain in left toes  Callus left foot  Consent was obtained for treatment procedures.   Mechanical debridement of nails 1-5  bilaterally performed with a nail nipper.  Filed with dremel without incident.  Debride callus with # 15 blade.   Return office visit     3 months                 Told patient to return for periodic foot care and evaluation due to potential at risk complications.   Cordella Bold DPM

## 2024-07-02 ENCOUNTER — Ambulatory Visit (HOSPITAL_COMMUNITY)
Admission: EM | Admit: 2024-07-02 | Discharge: 2024-07-02 | Disposition: A | Discharge status: Home / Self Care | Attending: Family Medicine | Admitting: Family Medicine

## 2024-07-02 ENCOUNTER — Encounter (HOSPITAL_COMMUNITY): Payer: Self-pay | Admitting: Emergency Medicine

## 2024-07-02 ENCOUNTER — Encounter (HOSPITAL_COMMUNITY): Payer: Self-pay

## 2024-07-02 ENCOUNTER — Other Ambulatory Visit: Payer: Self-pay

## 2024-07-02 ENCOUNTER — Inpatient Hospital Stay (HOSPITAL_COMMUNITY)
Admission: EM | Admit: 2024-07-02 | Discharge: 2024-07-06 | DRG: 193 | Disposition: A | Discharge status: Home / Self Care | Attending: Family Medicine | Admitting: Family Medicine

## 2024-07-02 ENCOUNTER — Emergency Department (HOSPITAL_COMMUNITY)

## 2024-07-02 DIAGNOSIS — K219 Gastro-esophageal reflux disease without esophagitis: Secondary | ICD-10-CM | POA: Diagnosis present

## 2024-07-02 DIAGNOSIS — Y842 Radiological procedure and radiotherapy as the cause of abnormal reaction of the patient, or of later complication, without mention of misadventure at the time of the procedure: Secondary | ICD-10-CM | POA: Diagnosis present

## 2024-07-02 DIAGNOSIS — R0602 Shortness of breath: Secondary | ICD-10-CM | POA: Diagnosis present

## 2024-07-02 DIAGNOSIS — J45901 Unspecified asthma with (acute) exacerbation: Secondary | ICD-10-CM | POA: Diagnosis present

## 2024-07-02 DIAGNOSIS — I110 Hypertensive heart disease with heart failure: Principal | ICD-10-CM | POA: Diagnosis present

## 2024-07-02 DIAGNOSIS — F32A Depression, unspecified: Secondary | ICD-10-CM | POA: Diagnosis present

## 2024-07-02 DIAGNOSIS — J449 Chronic obstructive pulmonary disease, unspecified: Secondary | ICD-10-CM | POA: Diagnosis not present

## 2024-07-02 DIAGNOSIS — E119 Type 2 diabetes mellitus without complications: Secondary | ICD-10-CM | POA: Diagnosis present

## 2024-07-02 DIAGNOSIS — C3411 Malignant neoplasm of upper lobe, right bronchus or lung: Secondary | ICD-10-CM | POA: Diagnosis present

## 2024-07-02 DIAGNOSIS — H3552 Pigmentary retinal dystrophy: Secondary | ICD-10-CM | POA: Diagnosis present

## 2024-07-02 DIAGNOSIS — Z7982 Long term (current) use of aspirin: Secondary | ICD-10-CM | POA: Diagnosis not present

## 2024-07-02 DIAGNOSIS — E876 Hypokalemia: Secondary | ICD-10-CM | POA: Diagnosis present

## 2024-07-02 DIAGNOSIS — I2729 Other secondary pulmonary hypertension: Secondary | ICD-10-CM | POA: Diagnosis present

## 2024-07-02 DIAGNOSIS — J441 Chronic obstructive pulmonary disease with (acute) exacerbation: Secondary | ICD-10-CM | POA: Diagnosis present

## 2024-07-02 DIAGNOSIS — Z79811 Long term (current) use of aromatase inhibitors: Secondary | ICD-10-CM | POA: Diagnosis not present

## 2024-07-02 DIAGNOSIS — I5033 Acute on chronic diastolic (congestive) heart failure: Secondary | ICD-10-CM | POA: Diagnosis present

## 2024-07-02 DIAGNOSIS — E7800 Pure hypercholesterolemia, unspecified: Secondary | ICD-10-CM | POA: Diagnosis present

## 2024-07-02 DIAGNOSIS — Z7984 Long term (current) use of oral hypoglycemic drugs: Secondary | ICD-10-CM

## 2024-07-02 DIAGNOSIS — I5031 Acute diastolic (congestive) heart failure: Secondary | ICD-10-CM | POA: Diagnosis not present

## 2024-07-02 DIAGNOSIS — Z91041 Radiographic dye allergy status: Secondary | ICD-10-CM

## 2024-07-02 DIAGNOSIS — G4733 Obstructive sleep apnea (adult) (pediatric): Secondary | ICD-10-CM | POA: Diagnosis not present

## 2024-07-02 DIAGNOSIS — F17210 Nicotine dependence, cigarettes, uncomplicated: Secondary | ICD-10-CM | POA: Diagnosis present

## 2024-07-02 DIAGNOSIS — Z902 Acquired absence of lung [part of]: Secondary | ICD-10-CM

## 2024-07-02 DIAGNOSIS — Z79899 Other long term (current) drug therapy: Secondary | ICD-10-CM

## 2024-07-02 DIAGNOSIS — I2781 Cor pulmonale (chronic): Secondary | ICD-10-CM | POA: Diagnosis present

## 2024-07-02 DIAGNOSIS — J9601 Acute respiratory failure with hypoxia: Secondary | ICD-10-CM | POA: Diagnosis present

## 2024-07-02 DIAGNOSIS — Z833 Family history of diabetes mellitus: Secondary | ICD-10-CM

## 2024-07-02 DIAGNOSIS — Z6838 Body mass index (BMI) 38.0-38.9, adult: Secondary | ICD-10-CM

## 2024-07-02 DIAGNOSIS — Z8673 Personal history of transient ischemic attack (TIA), and cerebral infarction without residual deficits: Secondary | ICD-10-CM

## 2024-07-02 DIAGNOSIS — J440 Chronic obstructive pulmonary disease with acute lower respiratory infection: Secondary | ICD-10-CM | POA: Diagnosis present

## 2024-07-02 DIAGNOSIS — C3490 Malignant neoplasm of unspecified part of unspecified bronchus or lung: Secondary | ICD-10-CM | POA: Diagnosis not present

## 2024-07-02 DIAGNOSIS — I2489 Other forms of acute ischemic heart disease: Secondary | ICD-10-CM | POA: Diagnosis present

## 2024-07-02 DIAGNOSIS — D649 Anemia, unspecified: Secondary | ICD-10-CM | POA: Diagnosis present

## 2024-07-02 DIAGNOSIS — Z1152 Encounter for screening for COVID-19: Secondary | ICD-10-CM

## 2024-07-02 DIAGNOSIS — Z91013 Allergy to seafood: Secondary | ICD-10-CM | POA: Diagnosis not present

## 2024-07-02 DIAGNOSIS — K208 Other esophagitis without bleeding: Secondary | ICD-10-CM | POA: Diagnosis present

## 2024-07-02 DIAGNOSIS — Z888 Allergy status to other drugs, medicaments and biological substances status: Secondary | ICD-10-CM

## 2024-07-02 DIAGNOSIS — J189 Pneumonia, unspecified organism: Principal | ICD-10-CM | POA: Diagnosis present

## 2024-07-02 DIAGNOSIS — E662 Morbid (severe) obesity with alveolar hypoventilation: Secondary | ICD-10-CM | POA: Diagnosis present

## 2024-07-02 DIAGNOSIS — R0902 Hypoxemia: Principal | ICD-10-CM

## 2024-07-02 DIAGNOSIS — Z7952 Long term (current) use of systemic steroids: Secondary | ICD-10-CM

## 2024-07-02 DIAGNOSIS — I959 Hypotension, unspecified: Secondary | ICD-10-CM | POA: Diagnosis not present

## 2024-07-02 DIAGNOSIS — Z85118 Personal history of other malignant neoplasm of bronchus and lung: Secondary | ICD-10-CM

## 2024-07-02 DIAGNOSIS — F419 Anxiety disorder, unspecified: Secondary | ICD-10-CM | POA: Diagnosis present

## 2024-07-02 DIAGNOSIS — Z91199 Patient's noncompliance with other medical treatment and regimen due to unspecified reason: Secondary | ICD-10-CM

## 2024-07-02 LAB — I-STAT CHEM 8, ED
BUN: 15 mg/dL (ref 8–23)
Calcium, Ion: 1.11 mmol/L — ABNORMAL LOW (ref 1.15–1.40)
Chloride: 100 mmol/L (ref 98–111)
Creatinine, Ser: 1.2 mg/dL — ABNORMAL HIGH (ref 0.44–1.00)
Glucose, Bld: 72 mg/dL (ref 70–99)
HCT: 27 % — ABNORMAL LOW (ref 36.0–46.0)
Hemoglobin: 9.2 g/dL — ABNORMAL LOW (ref 12.0–15.0)
Potassium: 3.1 mmol/L — ABNORMAL LOW (ref 3.5–5.1)
Sodium: 142 mmol/L (ref 135–145)
TCO2: 29 mmol/L (ref 22–32)

## 2024-07-02 LAB — CBC WITH DIFFERENTIAL/PLATELET
Abs Immature Granulocytes: 0.02 K/uL (ref 0.00–0.07)
Basophils Absolute: 0 K/uL (ref 0.0–0.1)
Basophils Relative: 0 %
Eosinophils Absolute: 0.1 K/uL (ref 0.0–0.5)
Eosinophils Relative: 1 %
HCT: 29.7 % — ABNORMAL LOW (ref 36.0–46.0)
Hemoglobin: 8.1 g/dL — ABNORMAL LOW (ref 12.0–15.0)
Immature Granulocytes: 0 %
Lymphocytes Relative: 8 %
Lymphs Abs: 0.5 K/uL — ABNORMAL LOW (ref 0.7–4.0)
MCH: 26 pg (ref 26.0–34.0)
MCHC: 27.3 g/dL — ABNORMAL LOW (ref 30.0–36.0)
MCV: 95.5 fL (ref 80.0–100.0)
Monocytes Absolute: 0.4 K/uL (ref 0.1–1.0)
Monocytes Relative: 7 %
Neutro Abs: 4.4 K/uL (ref 1.7–7.7)
Neutrophils Relative %: 84 %
Platelets: 270 K/uL (ref 150–400)
RBC: 3.11 MIL/uL — ABNORMAL LOW (ref 3.87–5.11)
RDW: 22 % — ABNORMAL HIGH (ref 11.5–15.5)
WBC: 5.4 K/uL (ref 4.0–10.5)
nRBC: 0 % (ref 0.0–0.2)

## 2024-07-02 LAB — COMPREHENSIVE METABOLIC PANEL WITH GFR
ALT: 24 U/L (ref 0–44)
AST: 29 U/L (ref 15–41)
Albumin: 3.6 g/dL (ref 3.5–5.0)
Alkaline Phosphatase: 87 U/L (ref 38–126)
Anion gap: 9 (ref 5–15)
BUN: 15 mg/dL (ref 8–23)
CO2: 31 mmol/L (ref 22–32)
Calcium: 9.4 mg/dL (ref 8.9–10.3)
Chloride: 102 mmol/L (ref 98–111)
Creatinine, Ser: 1.05 mg/dL — ABNORMAL HIGH (ref 0.44–1.00)
GFR, Estimated: 59 mL/min — ABNORMAL LOW
Glucose, Bld: 72 mg/dL (ref 70–99)
Potassium: 3.1 mmol/L — ABNORMAL LOW (ref 3.5–5.1)
Sodium: 142 mmol/L (ref 135–145)
Total Bilirubin: 0.2 mg/dL (ref 0.0–1.2)
Total Protein: 6.5 g/dL (ref 6.5–8.1)

## 2024-07-02 LAB — I-STAT CG4 LACTIC ACID, ED: Lactic Acid, Venous: 0.9 mmol/L (ref 0.5–1.9)

## 2024-07-02 LAB — PRO BRAIN NATRIURETIC PEPTIDE: Pro Brain Natriuretic Peptide: 2443 pg/mL — ABNORMAL HIGH

## 2024-07-02 LAB — CBG MONITORING, ED
Glucose-Capillary: 104 mg/dL — ABNORMAL HIGH (ref 70–99)
Glucose-Capillary: 253 mg/dL — ABNORMAL HIGH (ref 70–99)

## 2024-07-02 LAB — HIV ANTIBODY (ROUTINE TESTING W REFLEX): HIV Screen 4th Generation wRfx: NONREACTIVE

## 2024-07-02 LAB — TROPONIN T, HIGH SENSITIVITY
Troponin T High Sensitivity: 36 ng/L — ABNORMAL HIGH (ref 0–19)
Troponin T High Sensitivity: 34 ng/L — ABNORMAL HIGH (ref 0–19)

## 2024-07-02 LAB — RESP PANEL BY RT-PCR (RSV, FLU A&B, COVID)  RVPGX2
Influenza A by PCR: NEGATIVE
Influenza B by PCR: NEGATIVE
Resp Syncytial Virus by PCR: NEGATIVE
SARS Coronavirus 2 by RT PCR: NEGATIVE

## 2024-07-02 LAB — HEMOGLOBIN A1C
Hgb A1c MFr Bld: 5.4 % (ref 4.8–5.6)
Mean Plasma Glucose: 108.28 mg/dL

## 2024-07-02 LAB — D-DIMER, QUANTITATIVE: D-Dimer, Quant: 0.48 ug{FEU}/mL (ref 0.00–0.50)

## 2024-07-02 MED ORDER — SODIUM CHLORIDE 0.9 % IV SOLN
1.0000 g | Freq: Once | INTRAVENOUS | Status: AC
  Administered 2024-07-02: 1 g via INTRAVENOUS
  Filled 2024-07-02: qty 10

## 2024-07-02 MED ORDER — PREDNISONE 20 MG PO TABS
40.0000 mg | ORAL_TABLET | Freq: Every day | ORAL | Status: DC
  Administered 2024-07-03 – 2024-07-06 (×4): 40 mg via ORAL
  Filled 2024-07-02 (×4): qty 2

## 2024-07-02 MED ORDER — AZITHROMYCIN 250 MG PO TABS
500.0000 mg | ORAL_TABLET | Freq: Once | ORAL | Status: AC
  Administered 2024-07-02: 500 mg via ORAL
  Filled 2024-07-02: qty 2

## 2024-07-02 MED ORDER — METHYLPREDNISOLONE SODIUM SUCC 125 MG IJ SOLR
125.0000 mg | Freq: Once | INTRAMUSCULAR | Status: AC
  Administered 2024-07-02: 125 mg via INTRAVENOUS
  Filled 2024-07-02: qty 2

## 2024-07-02 MED ORDER — AZITHROMYCIN 500 MG PO TABS
500.0000 mg | ORAL_TABLET | Freq: Every day | ORAL | Status: DC
  Administered 2024-07-03 – 2024-07-06 (×4): 500 mg via ORAL
  Filled 2024-07-02 (×2): qty 1
  Filled 2024-07-02: qty 2
  Filled 2024-07-02: qty 1

## 2024-07-02 MED ORDER — BUDESONIDE 0.5 MG/2ML IN SUSP
2.0000 mg | Freq: Two times a day (BID) | RESPIRATORY_TRACT | Status: DC
  Administered 2024-07-02 – 2024-07-06 (×7): 2 mg via RESPIRATORY_TRACT
  Filled 2024-07-02 (×10): qty 8

## 2024-07-02 MED ORDER — ALBUTEROL SULFATE (2.5 MG/3ML) 0.083% IN NEBU
2.5000 mg | INHALATION_SOLUTION | RESPIRATORY_TRACT | Status: DC | PRN
  Administered 2024-07-02 – 2024-07-05 (×5): 2.5 mg via RESPIRATORY_TRACT
  Filled 2024-07-02 (×5): qty 3

## 2024-07-02 MED ORDER — INSULIN ASPART 100 UNIT/ML IJ SOLN
0.0000 [IU] | Freq: Three times a day (TID) | INTRAMUSCULAR | Status: DC
  Administered 2024-07-03 – 2024-07-04 (×2): 2 [IU] via SUBCUTANEOUS
  Administered 2024-07-04 – 2024-07-05 (×2): 3 [IU] via SUBCUTANEOUS
  Administered 2024-07-05 – 2024-07-06 (×2): 2 [IU] via SUBCUTANEOUS
  Filled 2024-07-02 (×2): qty 2
  Filled 2024-07-02: qty 5
  Filled 2024-07-02: qty 1
  Filled 2024-07-02: qty 2
  Filled 2024-07-02 (×2): qty 3

## 2024-07-02 MED ORDER — IPRATROPIUM-ALBUTEROL 0.5-2.5 (3) MG/3ML IN SOLN
3.0000 mL | Freq: Once | RESPIRATORY_TRACT | Status: AC
  Administered 2024-07-02: 3 mL via RESPIRATORY_TRACT
  Filled 2024-07-02: qty 3

## 2024-07-02 MED ORDER — IPRATROPIUM-ALBUTEROL 0.5-2.5 (3) MG/3ML IN SOLN
RESPIRATORY_TRACT | Status: AC
  Filled 2024-07-02: qty 3

## 2024-07-02 MED ORDER — INSULIN ASPART 100 UNIT/ML IJ SOLN
0.0000 [IU] | Freq: Every day | INTRAMUSCULAR | Status: DC
  Administered 2024-07-02: 3 [IU] via SUBCUTANEOUS
  Filled 2024-07-02: qty 3

## 2024-07-02 MED ORDER — SODIUM CHLORIDE 0.9 % IV SOLN
2.0000 g | INTRAVENOUS | Status: DC
  Administered 2024-07-03 – 2024-07-06 (×4): 2 g via INTRAVENOUS
  Filled 2024-07-02 (×4): qty 20

## 2024-07-02 MED ORDER — MAGNESIUM SULFATE 2 GM/50ML IV SOLN
2.0000 g | Freq: Once | INTRAVENOUS | Status: AC
  Administered 2024-07-02: 2 g via INTRAVENOUS
  Filled 2024-07-02: qty 50

## 2024-07-02 MED ORDER — IPRATROPIUM-ALBUTEROL 0.5-2.5 (3) MG/3ML IN SOLN
3.0000 mL | Freq: Once | RESPIRATORY_TRACT | Status: AC
  Administered 2024-07-02: 3 mL via RESPIRATORY_TRACT

## 2024-07-02 MED ORDER — ENOXAPARIN SODIUM 40 MG/0.4ML IJ SOSY
40.0000 mg | PREFILLED_SYRINGE | INTRAMUSCULAR | Status: DC
  Administered 2024-07-02 – 2024-07-05 (×4): 40 mg via SUBCUTANEOUS
  Filled 2024-07-02 (×4): qty 0.4

## 2024-07-02 NOTE — ED Triage Notes (Signed)
 [facility] BIB [facility]  for exertional SHOB and hypoxia. Patient was 85% on RA so UC placed her on 2L and gave duoneb. Patient arrives at 100% on 2L. No other complaints at this time.

## 2024-07-02 NOTE — ED Triage Notes (Signed)
 Pt reports For 7 days had runny nose, cough, SOB with walking. Took Coricidin that hasn't helped.

## 2024-07-02 NOTE — ED Notes (Addendum)
 Chartered Certified Accountant made aware. Was given number to EMS triage to call. Tried calling number given and was transferred back to [employer] who I gave report about patient coming.

## 2024-07-02 NOTE — ED Provider Notes (Signed)
 " [facility]    CSN: [id number] Arrival date & time: 07/02/24  1220      History   Chief Complaint Chief Complaint  Patient presents with   Cough   Nasal Congestion    HPI Susan Farley is a 64 y.o. female.    Cough Here for nasal congestion and cough and shortness of breath.  Symptoms began about 1 week ago.  She has been using her inhaler some and it has been helping sometimes, but she has not used it at all today.  Uncertain fever  No vomiting or diarrhea  She is allergic to shellfish, HCTZ and iodine, metformin  and omeprazole in the statins and Wellbutrin and pregabalin    Past Medical History:  Diagnosis Date   Allergy    Anemia    Anxiety    Asthma    Cancer (HCC)    Cataract    Colitis    Depression    Diabetes mellitus without complication (HCC)    GERD (gastroesophageal reflux disease)    Heart murmur    Hypercholesteremia    Hypertension    Retinitis pigmentosa    Sleep apnea    doesnt wear BIPAP currently   Stroke (HCC) 04/2014   no residual    Patient Active Problem List   Diagnosis Date Noted   Cancer (HCC)    Arthralgia of shoulder 12/12/2023   Major depressive disorder, single episode, unspecified 12/12/2023   Malignant neoplasm of upper lobe, right bronchus or lung (HCC) 12/12/2023   Malignant neoplasm of right upper lobe of lung (HCC) 12/12/2023   Malignant neoplasm of unspecified part of unspecified bronchus or lung (HCC) 12/12/2023   Other nonspecific abnormal finding of lung field 12/12/2023   Other specified problems related to psychosocial circumstances 12/12/2023   Primary malignant neoplasm of right lung (HCC) 12/12/2023   Single subsegmental thrombotic pulmonary embolism without acute cor pulmonale (HCC) 12/12/2023   Urgency of urination 12/12/2023   Abdominal bloating 06/06/2023   Abdominal pain 06/06/2023   Anemia 06/06/2023   Asymptomatic varicose veins 06/06/2023   Benign mammary dysplasia 06/06/2023    Bicipital tenosynovitis 06/06/2023   Breast cancer (HCC) 06/06/2023   Carcinoma in situ of unspecified bronchus and lung 06/06/2023   Cervical dysplasia 06/06/2023   Chest wall pain 06/06/2023   Chronic sinusitis, unspecified 06/06/2023   Constipation 06/06/2023   Corn 06/06/2023   Dermatophytosis of nail 06/06/2023   Disturbance of skin sensation 06/06/2023   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, unspecified 06/06/2023   Dry eyes 06/06/2023   Dysmenorrhea 06/06/2023   Edema, unspecified 06/06/2023   Endometriosis 06/06/2023   Enthesopathy of ankle and tarsus 06/06/2023   Eustachian tube disorder 06/06/2023   Excessive cerumen in ear canal 06/06/2023   Flatulence 06/06/2023   Folliculitis 06/06/2023   Pain in unspecified foot 06/06/2023   Generalized pain 06/06/2023   Heart murmur 06/06/2023   Hypersomnia with sleep apnea 06/06/2023   Intraductal carcinoma in situ of right breast 06/06/2023   Iron  deficiency 06/06/2023   Legal blindness, as defined in [place]  06/06/2023   Major depressive disorder, recurrent, 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/06/2023   Morbid obesity (HCC) 06/06/2023   Neck pain 06/06/2023   Neoplasm of uncertain behavior of vulva 06/06/2023   Neuropathy 06/06/2023   Numbness of lower limb 06/06/2023   Encounter for examination of blood pressure without abnormal findings 06/06/2023   Depression 06/06/2023   Finger pain 06/06/2023   Right lower  quadrant pain 06/06/2023   Symptomatic menopausal or female climacteric states 06/06/2023   Type 2 diabetes mellitus with other diabetic ophthalmic complication (HCC) 06/06/2023   Umbilical hernia 06/06/2023   Visual field constriction 06/06/2023   Iron  deficiency anemia due to chronic blood loss 08/02/2022   Current nonadherence to medical treatment 09/20/2021   Intolerance of continuous positive airway pressure (CPAP) ventilation 07/07/2021   Chronic nasal congestion 07/07/2021    Chronic ethmoidal sinusitis 07/07/2021   Nicotine  dependence 07/07/2021   Severe obstructive sleep apnea-hypopnea syndrome 07/07/2021   Acquired trigger finger of both middle fingers 11/01/2018   De Quervain's tenosynovitis, right 11/01/2018   Carcinoma of upper-outer quadrant of female breast, right (HCC) 07/10/2018   Thumb pain, left 06/27/2017   Trigger finger of left thumb 05/09/2017   Trigger middle finger of right hand 05/09/2017   Stage I squamous cell carcinoma of left lung (HCC) 04/11/2017   Lung nodule 03/24/2017   S/P lobectomy of lung 02/24/2017   Peripheral arterial disease 04/26/2016   Tobacco use disorder 02/10/2015   OSA on CPAP 02/10/2015   Type 2 diabetes mellitus with other circulatory complications (HCC)    Numbness on right side    Stroke [facility])    Acute ischemic VBA thalamic stroke (HCC) 04/24/2014   Essential hypertension 04/24/2014   Hyperglycemia 04/24/2014   Acute CVA (cerebrovascular accident) (HCC) 04/24/2014   Leukocytosis 04/24/2014   Hypokalemia 04/24/2014   Numbness    HTN (hypertension) 07/25/2012   HLD (hyperlipidemia) 07/25/2012   Retinitis pigmentosa 07/25/2012   GERD (gastroesophageal reflux disease) 07/25/2012    Past Surgical History:  Procedure Laterality Date   BONE GRAFT HIP ILIAC CREST     CATARACT EXTRACTION     COLONOSCOPY     IUD REMOVAL     LOBECTOMY Left 02/24/2017   Procedure: Left upper lobe LOBECTOMY;  Surgeon: Kerrin Elspeth BROCKS, MD;  Location: [facility] OR;  Service: Thoracic;  Laterality: Left;   NODE DISSECTION Left 02/24/2017   Procedure: NODE DISSECTION;  Surgeon: Kerrin Elspeth BROCKS, MD;  Location: [facility] OR;  Service: Thoracic;  Laterality: Left;   OOPHORECTOMY Right 2004   removal of cyst and ovary   RETINAL CRYOABLATION Bilateral    patient unsure of whether she had this   VIDEO ASSISTED THORACOSCOPY (VATS)/WEDGE RESECTION Left 02/24/2017   Procedure: Left VIDEO ASSISTED THORACOSCOPY (VATS)/WEDGE RESECTION;  Surgeon:  Kerrin Elspeth BROCKS, MD;  Location: [facility] OR;  Service: Thoracic;  Laterality: Left;    OB History   No obstetric history on file.      Home Medications    Prior to Admission medications  Medication Sig Start Date End Date Taking? Authorizing Provider  rosuvastatin  (CRESTOR ) 40 MG tablet Take 40 mg by mouth daily. 05/22/24  Yes [provider]  acetaminophen  (TYLENOL ) 500 MG tablet Take 2 tablets (1,000 mg total) by mouth every 6 (six) hours as needed. 02/28/17   Barrett, Erin R, PA-C  amLODipine  (NORVASC ) 10 MG tablet Take 1 tablet (10 mg total) by mouth daily. 02/28/17   Barrett, Erin R, PA-C  anastrozole (ARIMIDEX) 1 MG tablet  06/04/18   [provider]  aspirin  EC 325 MG EC tablet Take 1 tablet (325 mg total) by mouth daily. 04/25/14   Mikhail, Maryann, DO  azelastine (ASTELIN) 0.1 % nasal spray Place into both nostrils. 01/15/24   [provider]  cetirizine  (ZYRTEC  ALLERGY) 10 MG tablet Take 1 tablet (10 mg total) by mouth daily. 10/26/19   Christopher Savannah, PA-C  [employer] F[employer]ER GUMMIES PO Take 2 capsules by mouth daily.    [provider]  dexamethasone  (OZURDEX ) 0.7 MG IMPL 0.7 mg by Intravitreal route every 3 (three) months.     [provider]  docusate sodium  (COLACE) 100 MG capsule Take 200 mg by mouth at bedtime.    [provider]  dorzolamide  (TRUSOPT ) 2 % ophthalmic solution Place 1 drop into both eyes 2 (two) times daily.    [provider]  EPINEPHrine 0.3 mg/0.3 mL IJ SOAJ injection  05/28/18   [provider]  ergocalciferol (VITAMIN D2) 1.25 MG (50000 UT) capsule Oral 04/18/14   [provider]  fluconazole  (DIFLUCAN ) 100 MG tablet 200 mg for 1 day then 100 mg for 13 days 08/15/22   Albertus Gordy HERO, MD  fluticasone OREN) 50 MCG/ACT nasal spray  05/28/18   [provider]  hydrochlorothiazide  (HYDRODIURIL ) 25 MG tablet Take 25 mg by mouth daily.    [provider]  lisinopril   (PRINIVIL ,ZESTRIL ) 5 MG tablet Take 1 tablet (5 mg total) by mouth daily. 02/28/17   Barrett, Erin R, PA-C  loteprednol (LOTEMAX) 0.5 % ophthalmic suspension 1 drop 3 (three) times daily. 02/26/24   [provider]  metFORMIN  (GLUCOPHAGE ) 500 MG tablet Oral 04/18/14   [provider]  MIEBO 1.338 GM/ML SOLN Apply 1 drop to eye 2 (two) times daily. 11/20/23   [provider]  nystatin (MYCOSTATIN) 100000 UNIT/ML suspension  05/29/18   [provider]  ondansetron  (ZOFRAN ) 8 MG tablet  04/27/18   [provider]  pantoprazole  (PROTONIX ) 40 MG tablet Take 80 mg by mouth daily.     [provider]  pioglitazone (ACTOS) 15 MG tablet Take 15 mg by mouth daily. 10/02/23   [provider]  potassium chloride  20 MEQ/15ML (10%) SOLN SMARTSIG:Milliliter(s) By Mouth 01/03/24   [provider]  potassium chloride  SA (K-DUR,KLOR-CON ) 20 MEQ tablet Take 1 tablet (20 mEq total) by mouth daily. 02/28/17   Barrett, Erin R, PA-C  pregabalin (LYRICA) 200 MG capsule Take 200 mg by mouth 2 (two) times daily. 11/15/23   [provider]  QUEtiapine  (SEROQUEL ) 25 MG tablet Take 25 mg by mouth at bedtime.    [provider]  rosuvastatin  (CRESTOR ) 20 MG tablet Take 20 mg by mouth every evening.    [provider]  sertraline  (ZOLOFT ) 100 MG tablet Oral 04/18/14   [provider]  simvastatin  (ZOCOR ) 20 MG tablet Oral 04/18/14   [provider]  sucralfate  (CARAFATE ) 1 g tablet Take 1 tablet (1 g total) by mouth 4 (four) times daily -  with meals and at bedtime. Crush 1 tablet in 1 oz water and drink 5 min before meals for radiation induced esophagitis 09/04/23   Lanell Donald Stagger, PA-C  timolol  (BETIMOL ) 0.5 % ophthalmic solution Place 1 drop into both eyes 2 (two) times daily.    [provider]  timolol  (TIMOPTIC ) 0.5 % ophthalmic solution AS DIRECTED INSTILL 1 DROP IN BOTH EYES TWICE A DAY 02/19/17    [provider]  triamcinolone  cream (KENALOG) 0.1 % SMARTSIG:1 Application Topical 2-3 Times Daily 04/11/24   [provider]  TRYPTYR 0.003 % SOLN Apply 1 drop to eye 2 (two) times daily. 03/13/24   [provider]  urea (CARMOL) 40 % CREA Apply topically. 11/21/23   [provider]  venlafaxine  XR (EFFEXOR -XR) 37.5 MG 24 hr capsule Take 187.5 mg by mouth at bedtime.    [provider]  VEVYE 0.1 % SOLN Apply 1 drop to eye 2 (two) times daily. 01/25/24   [provider]  VICTOZA 18 MG/3ML SOPN Inject into the skin. 08/12/21   [provider]  XDEMVY 0.25 % SOLN  04/01/24   [provider]    Family History Family History  Adopted: Yes  Problem Relation Age of Onset   Diabetes Other        BIOLOGICAL AUNT   Heart disease Other        BIOLOGICAL AUNT   Colon cancer Neg Hx    Colon polyps Neg Hx    Stomach cancer Neg Hx     Social History Social History[1]   Allergies   Shellfish allergy, Diaper rash products, Hydrochlorothiazide , Iodine, Lansoprazole, Metformin , Omeprazole, Pravastatin, Simvastatin , Tomato, Tomato (diagnostic), Bupropion, Empagliflozin, and Pregabalin   Review of Systems Review of Systems  Respiratory:  Positive for cough.      Physical Exam Triage Vital Signs ED Triage Vitals  Encounter Vitals Group     BP 07/02/24 1312 92/60     Girls Systolic BP Percentile --      Girls Diastolic BP Percentile --      Boys Systolic BP Percentile --      Boys Diastolic BP Percentile --      Pulse Rate 07/02/24 1312 90     Resp 07/02/24 1312 20     Temp 07/02/24 1312 97.9 F (36.6 C)     Temp Source 07/02/24 1312 Oral     SpO2 07/02/24 1312 (!) 85 %     Weight --      Height --      Head Circumference --      Peak Flow --      Pain Score 07/02/24 1311 0     Pain Loc --      Pain Education --      Exclude from Growth Chart --    No data found.  Updated Vital Signs BP 92/60 (BP Location:  Left Arm)   Pulse 90   Temp 97.9 F (36.6 C) (Oral)   Resp 20   LMP 09/27/2011   SpO2 93%   Visual Acuity Right Eye Distance:   Left Eye Distance:   Bilateral Distance:    Right Eye Near:   Left Eye Near:    Bilateral Near:     Physical Exam Vitals (O2 sat on room air is 82 to 85%.) reviewed.  Constitutional:      Appearance: She is not ill-appearing, toxic-appearing or diaphoretic.     Comments: She does not necessarily appear air hungry on exam, but she is a little slow to answer questions though she seems alert and oriented.  HENT:     Nose: Congestion present.     Mouth/Throat:     Mouth: Mucous membranes are moist.  Eyes:     Extraocular Movements: Extraocular movements intact.     Conjunctiva/sclera: Conjunctivae normal.     Pupils: Pupils are equal, round, and reactive to light.  Cardiovascular:     Rate and Rhythm: Normal rate and regular rhythm.     Heart sounds: No murmur heard. Pulmonary:     Effort: No respiratory distress.     Breath sounds: No stridor. No wheezing or rales.     Comments: Expiratory breath sounds are coarse.  Overall air movement is good but there are diminished breath sounds in the right upper and lower lung field Musculoskeletal:  Cervical back: Neck supple.  Skin:    Coloration: Skin is not jaundiced or pale.  Neurological:     General: No focal deficit present.     Mental Status: She is oriented to person, place, and time.  Psychiatric:        Behavior: Behavior normal.      UC Treatments / Results  Labs (all labs ordered are listed, but only abnormal results are displayed) Labs Reviewed - No data to display  EKG   Radiology No results found.  Procedures Procedures (including critical care time)  Medications Ordered in UC Medications  ipratropium-albuterol  (DUONEB) 0.5-2.5 (3) MG/3ML nebulizer solution 3 mL (3 mLs Nebulization Given 07/02/24 1324)    Initial Impression / Assessment and Plan / UC Course  I have  reviewed the triage vital signs and the nursing notes.  Pertinent labs & imaging results that were available during my care of the patient were reviewed by me and considered in my medical decision making (see chart for details).     Oxygen by nasal cannula is applied in a breathing treatment with DuoNeb is given to see if that will give her any benefit.  After breathing treatment on 2 L she feels better, but her O2 sat was in the 70s when I was examining her.   I have increased her O2 to 3 L and it is now staying around 90 on 3 L nasal cannula  We will call the ambulance to transport her to the emergency room for further evaluation and treatment due to her hypoxia Final Clinical Impressions(s) / UC Diagnoses   Final diagnoses:  None   Discharge Instructions   None    ED Prescriptions   None    PDMP not reviewed this encounter.     [1]  Social History Tobacco Use   Smoking status: Every Day    Current packs/day: 2.00    Average packs/day: 2.0 packs/day for 47.3 years (94.7 ttl pk-yrs)    Types: Cigarettes    Start date: 02/28/1977   Smokeless tobacco: Never   Tobacco comments:    2pk per day   Vaping Use   Vaping status: Former  Substance Use Topics   Alcohol use: Not Currently   Drug use: No     Vonna Sharlet POUR, MD 07/02/24 1342  "

## 2024-07-02 NOTE — Discharge Instructions (Signed)
 We gave you 1 dose of DuoNeb by nebulizer  Your oxygen went into the 70s even when you are on 2 L of oxygen

## 2024-07-02 NOTE — ED Notes (Addendum)
 Patient O2 saturation dropping from 90s-70s on 3L oxygen.  Per Dr Vonna call [facility] for transport

## 2024-07-02 NOTE — H&P (Signed)
 " History and Physical    Susan Farley FMW:[id number] DOB: [birth year] DOA: 07/02/2024  PCP: [facility]   Chief Complaint: Shortness of breath  HPI: Susan Farley is a 64 y.o. 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    Patient presents to our facility with worsening dyspnea, nasal congestion and cough found to be notably hypoxic with reported sick contacts over the holidays.  No recent travel other than locally for family holiday events.  Given profound hypoxia from baseline hospitalist called for admission.  Review of Systems: As per HPI denies nausea vomiting diarrhea constipation headache fevers chills chest pain.  She does report loss of taste and limited smell over the past few days as well.   Assessment/Plan Principal Problem:   Acute respiratory failure with hypoxia (HCC)   Acute hypoxic respiratory failure, COPD versus asthma exacerbation, POA Concurrent community-acquired pneumonia, POA - Home med rec pending, restart LABA LAMA ICS as appropriate - Continue as needed nebs, IV steroids x 1, transition to p.o. prednisone  in the morning - Wheeze already markedly improved although remains hypoxic even at rest (baseline is on room air) - X-ray with notable right middle to upper lobe opacity concerning for overlying infection, azithromycin , ceftriaxone  x 5 days -Consider antibiotic broadening if no improvement in the next 24 to 48 hours given concurrent immunotherapy ongoing - Patient is an ongoing smoker, see discussion as below - No clear indication for infection, flu RSV COVID swab negative  Non-insulin -dependent diabetes type 2  A1c 5.6, well-controlled - Continue sliding scale insulin , hypoglycemic protocol - Diabetic diet initiated  Lung cancer,  under ongoing treatment -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) - Resection 2018 - Currently on immunotherapy  OSA/obesity hypoventilation syndrome noncompliant with cpap -willing to trial CPAP here HLD-resume home medications once verified Tobacco abuse, ongoing-lengthy discussion in regards to cessation given above acute hypoxia, COPD, cancer history   DVT prophylaxis: Lovenox  Code Status: Full Family Communication: None present Status is: Inpatient  Dispo: The patient is from: Home              Anticipated d/c is to: Home              Anticipated d/c date is: 48 to 72 hours              Patient currently not medically stable for discharge  Consultants:  None  Procedures:  None   Past Medical History:  Diagnosis Date   Allergy    Anemia    Anxiety    Asthma    Cancer (HCC)    Cataract    Colitis    Depression    Diabetes mellitus without complication (HCC)    GERD (gastroesophageal reflux disease)    Heart murmur    Hypercholesteremia    Hypertension    Retinitis pigmentosa    Sleep apnea    doesnt wear BIPAP currently   Stroke (HCC) 04/2014   no residual    Past Surgical History:  Procedure Laterality Date   BONE GRAFT HIP ILIAC CREST     CATARACT EXTRACTION     COLONOSCOPY     IUD REMOVAL     LOBECTOMY Left 02/24/2017   Procedure: Left upper lobe LOBECTOMY;  Surgeon: Kerrin Elspeth BROCKS, MD;  Location: [facility]  OR;  Service: Thoracic;  Laterality: Left;   NODE DISSECTION Left 02/24/2017   Procedure: NODE DISSECTION;  Surgeon: Kerrin Elspeth BROCKS, MD;  Location: [facility] OR;  Service: Thoracic;  Laterality: Left;   OOPHORECTOMY Right 2004   removal of cyst and ovary   RETINAL CRYOABLATION Bilateral    patient unsure of whether she had this   VIDEO ASSISTED THORACOSCOPY (VATS)/WEDGE RESECTION Left 02/24/2017   Procedure: Left VIDEO ASSISTED THORACOSCOPY (VATS)/WEDGE RESECTION;  Surgeon: Kerrin Elspeth BROCKS, MD;  Location: [facility] OR;  Service: Thoracic;  Laterality: Left;     reports that she has been smoking cigarettes. She started smoking about 47 years ago. She has a 94.7 pack-year smoking history. She has never used smokeless tobacco. She reports that she does not currently use alcohol. She reports that she does not use drugs.  Allergies[1]  Family History  Adopted: Yes  Problem Relation Age of Onset   Diabetes Other        BIOLOGICAL AUNT   Heart disease Other        BIOLOGICAL AUNT   Colon cancer Neg Hx    Colon polyps Neg Hx    Stomach cancer Neg Hx     Prior to Admission medications  Medication Sig Start Date End Date Taking? Authorizing Provider  acetaminophen  (TYLENOL ) 500 MG tablet Take 2 tablets (1,000 mg total) by mouth every 6 (six) hours as needed. 02/28/17   Barrett, Rocky SAUNDERS, PA-C  amLODipine  (NORVASC ) 10 MG tablet Take 1 tablet (10 mg total) by mouth daily. 02/28/17   Barrett, Erin R, PA-C  anastrozole (ARIMIDEX) 1 MG tablet  06/04/18   [provider]  aspirin  EC 325 MG EC tablet Take 1 tablet (325 mg total) by mouth daily. 04/25/14   Mikhail, Maryann, DO  azelastine (ASTELIN) 0.1 % nasal spray Place into both nostrils. 01/15/24   [provider]  cetirizine  (ZYRTEC  ALLERGY) 10 MG tablet Take 1 tablet (10 mg total) by mouth daily. 10/26/19   Christopher Savannah, PA-C  [employer] F[employer]ER GUMMIES PO Take 2 capsules by mouth daily.    [provider]  dexamethasone  (OZURDEX ) 0.7 MG IMPL 0.7 mg by Intravitreal route every 3 (three) months.     [provider]  docusate sodium  (COLACE) 100 MG capsule Take 200 mg by mouth at bedtime.    [provider]  dorzolamide  (TRUSOPT ) 2 % ophthalmic solution Place 1 drop into both eyes 2 (two) times daily.    [provider]  EPINEPHrine 0.3 mg/0.3 mL IJ SOAJ injection  05/28/18   [provider]  ergocalciferol (VITAMIN D2) 1.25 MG (50000 UT) capsule Oral 04/18/14   [provider]   fluconazole  (DIFLUCAN ) 100 MG tablet 200 mg for 1 day then 100 mg for 13 days 08/15/22   Albertus Gordy HERO, MD  fluticasone OREN) 50 MCG/ACT nasal spray  05/28/18   [provider]  hydrochlorothiazide  (HYDRODIURIL ) 25 MG tablet Take 25 mg by mouth daily.    [provider]  lisinopril  (PRINIVIL ,ZESTRIL ) 5 MG tablet Take 1 tablet (5 mg total) by mouth daily. 02/28/17   Barrett, Erin R, PA-C  loteprednol (LOTEMAX) 0.5 % ophthalmic suspension 1 drop 3 (three) times daily. 02/26/24   [provider]  metFORMIN  (GLUCOPHAGE ) 500 MG tablet Oral 04/18/14   [provider]  MIEBO 1.338 GM/ML SOLN Apply 1 drop to eye 2 (two) times daily. 11/20/23   [provider]  nystatin (MYCOSTATIN) 100000 UNIT/ML suspension  05/29/18  [provider]  ondansetron  (ZOFRAN ) 8 MG tablet  04/27/18   [provider]  pantoprazole  (PROTONIX ) 40 MG tablet Take 80 mg by mouth daily.     [provider]  pioglitazone (ACTOS) 15 MG tablet Take 15 mg by mouth daily. 10/02/23   [provider]  potassium chloride  20 MEQ/15ML (10%) SOLN SMARTSIG:Milliliter(s) By Mouth 01/03/24   [provider]  potassium chloride  SA (K-DUR,KLOR-CON ) 20 MEQ tablet Take 1 tablet (20 mEq total) by mouth daily. 02/28/17   Barrett, Erin R, PA-C  pregabalin (LYRICA) 200 MG capsule Take 200 mg by mouth 2 (two) times daily. 11/15/23   [provider]  QUEtiapine  (SEROQUEL ) 25 MG tablet Take 25 mg by mouth at bedtime.    [provider]  rosuvastatin  (CRESTOR ) 20 MG tablet Take 20 mg by mouth every evening.    [provider]  rosuvastatin  (CRESTOR ) 40 MG tablet Take 40 mg by mouth daily. 05/22/24   [provider]  sertraline  (ZOLOFT ) 100 MG tablet Oral 04/18/14   [provider]  simvastatin  (ZOCOR ) 20 MG tablet Oral 04/18/14   [provider]  sucralfate  (CARAFATE ) 1 g tablet Take 1 tablet (1 g total) by mouth 4 (four)  times daily -  with meals and at bedtime. Crush 1 tablet in 1 oz water and drink 5 min before meals for radiation induced esophagitis 09/04/23   Lanell Donald Stagger, PA-C  timolol  (BETIMOL ) 0.5 % ophthalmic solution Place 1 drop into both eyes 2 (two) times daily.    [provider]  timolol  (TIMOPTIC ) 0.5 % ophthalmic solution AS DIRECTED INSTILL 1 DROP IN BOTH EYES TWICE A DAY 02/19/17   [provider]  triamcinolone  cream (KENALOG) 0.1 % SMARTSIG:1 Application Topical 2-3 Times Daily 04/11/24   [provider]  TRYPTYR 0.003 % SOLN Apply 1 drop to eye 2 (two) times daily. 03/13/24   [provider]  urea (CARMOL) 40 % CREA Apply topically. 11/21/23   [provider]  venlafaxine  XR (EFFEXOR -XR) 37.5 MG 24 hr capsule Take 187.5 mg by mouth at bedtime.    [provider]  VEVYE 0.1 % SOLN Apply 1 drop to eye 2 (two) times daily. 01/25/24   [provider]  VICTOZA 18 MG/3ML SOPN Inject into the skin. 08/12/21   [provider]  XDEMVY 0.25 % SOLN  04/01/24   [provider]    Physical Exam: Vitals:   07/02/24 1434 07/02/24 1443  BP: 103/64   Pulse: 94   Resp: 16   Temp: 97.8 F (36.6 C)   SpO2: 100%   Weight:  102.5 kg  Height:  5' 4 (1.626 m)    Constitutional: NAD, calm, comfortable Vitals:   07/02/24 1434 07/02/24 1443  BP: 103/64   Pulse: 94   Resp: 16   Temp: 97.8 F (36.6 C)   SpO2: 100%   Weight:  102.5 kg  Height:  5' 4 (1.626 m)   General:  Pleasantly resting in bed, No acute distress. HEENT:  Normocephalic atraumatic.  Sclerae nonicteric, noninjected.  Extraocular movements intact bilaterally. Neck:  Without mass or deformity.  Trachea is midline. Lungs/chest: Diminished bilaterally without rales or wheeze, noted left mastectomy scar. Heart:  Regular rate and rhythm.  Without murmurs, rubs, or gallops. Abdomen:  Soft, nontender, moderately distended Extremities: Without cyanosis,  clubbing, edema, or obvious deformity. Skin:  Warm and dry, no erythema.  Labs on Admission: I have personally reviewed following labs and  imaging studies  CBC: Recent Labs  Lab 07/02/24 1528 07/02/24 1539  WBC 5.4  --   NEUTROABS 4.4  --   HGB 8.1* 9.2*  HCT 29.7* 27.0*  MCV 95.5  --   PLT 270  --    Basic Metabolic Panel: Recent Labs  Lab 07/02/24 1528 07/02/24 1539  NA 142 142  K 3.1* 3.1*  CL 102 100  CO2 31  --   GLUCOSE 72 72  BUN 15 15  CREATININE 1.05* 1.20*  CALCIUM  9.4  --    GFR: Estimated Creatinine Clearance: 55.9 mL/min (A) (by C-G formula based on SCr of 1.2 mg/dL (H)). Liver Function Tests: Recent Labs  Lab 07/02/24 1528  AST 29  ALT 24  ALKPHOS 87  BILITOT 0.2  PROT 6.5  ALBUMIN 3.6   No results for input(s): LIPASE, AMYLASE in the last 168 hours. No results for input(s): AMMONIA in the last 168 hours. Coagulation Profile: No results for input(s): INR, PROTIME in the last 168 hours. Cardiac Enzymes: No results for input(s): CKTOTAL, CKMB, CKMBINDEX, TROPONINI in the last 168 hours. BNP (last 3 results) Recent Labs    07/02/24 1528  PROBNP 2,443.0*   HbA1C: No results for input(s): HGBA1C in the last 72 hours. CBG: No results for input(s): GLUCAP in the last 168 hours. Lipid Profile: No results for input(s): CHOL, HDL, LDLCALC, TRIG, CHOLHDL, LDLDIRECT in the last 72 hours. Thyroid  Function Tests: No results for input(s): TSH, T4TOTAL, FREET4, T3FREE, THYROIDAB in the last 72 hours. Anemia Panel: No results for input(s): VITAMINB12, FOLATE, FERRITIN, TIBC, IRON , RETICCTPCT in the last 72 hours. Urine analysis:    Component Value Date/Time   COLORURINE YELLOW 02/22/2017 1258   APPEARANCEUR CLEAR 02/22/2017 1258   LABSPEC 1.017 02/22/2017 1258   PHURINE 5.0 02/22/2017 1258   GLUCOSEU NEGATIVE 02/22/2017 1258   HGBUR NEGATIVE 02/22/2017 1258   BILIRUBINUR NEGATIVE 02/22/2017  1258   KETONESUR NEGATIVE 02/22/2017 1258   PROTEINUR NEGATIVE 02/22/2017 1258   UROBILINOGEN 0.2 04/24/2014 1128   NITRITE NEGATIVE 02/22/2017 1258   LEUKOCYTESUR NEGATIVE 02/22/2017 1258    Radiological Exams on Admission: DG Chest 2 View Result Date: 07/02/2024 CLINICAL DATA:  Shortness of breath, cough EXAM: CHEST - 2 VIEW COMPARISON:  10/26/2019 FINDINGS: Frontal and lateral views of the chest demonstrate an enlarged cardiac silhouette. There is patchy right perihilar airspace disease and trace right pleural effusion. Left chest is clear. No pneumothorax. IMPRESSION: 1. Patchy right perihilar airspace disease and trace right effusion, favor bronchopneumonia over asymmetric edema. 2. Enlarged cardiac silhouette. Electronically Signed   By: Ozell Daring M.D.   On: 07/02/2024 16:02    EKG: Independently reviewed.    Elsie JAYSON Montclair DO [facility] For contact please use secure messenger on Epic  If 7PM-7AM, please contact night-coverage located on [email or web address]   07/02/2024, 5:07 PM       [1]  Allergies Allergen Reactions   Shellfish Allergy Anaphylaxis   Diaper Rash Products     Other reaction(s): Other (See Comments)   Hydrochlorothiazide      Other reaction(s): Other (See Comments)   Iodine Other (See Comments)   Lansoprazole     Other reaction(s): Other (See Comments)   Metformin  Nausea Only and Diarrhea    Other reaction(s): GI Upset (intolerance)   Omeprazole     Other reaction(s): Other (See Comments)   Pravastatin     Other reaction(s): Other (See Comments)   Simvastatin      Other reaction(s): MYALGIA,  Myalgias (intolerance)   Tomato     Other reaction(s): Other (See Comments)   Tomato (Diagnostic) Other (See Comments)   Bupropion Hives and Rash   Empagliflozin     Other Reaction(s): Increased frequency of urination, Other (See Comments)   Pregabalin     Other Reaction(s): Muscle pain   "

## 2024-07-02 NOTE — ED Notes (Signed)
[facility] made aware of transport needed to ED 

## 2024-07-02 NOTE — ED Provider Notes (Signed)
 " [place] EMERGENCY DEPARTMENT AT [facility] Provider Note   CSN: [id number] Arrival date & time: 07/02/24  1433     Patient presents with: Shortness of Breath and Hypoxic   Susan Farley is a 64 y.o. female.   The history is provided by the patient and medical records. No language interpreter was used.  Shortness of Breath Severity:  Severe Onset quality:  Gradual Duration:  1 week Timing:  Constant Progression:  Waxing and waning Chronicity:  New Context: URI   Relieved by:  Nothing Worsened by:  Exertion and coughing Ineffective treatments:  Inhaler Associated symptoms: cough, sputum production and wheezing   Associated symptoms: no abdominal pain, no chest pain, no fever, no headaches, no neck pain, no rash and no vomiting   Risk factors: hx of cancer and hx of PE/DVT        Prior to Admission medications  Medication Sig Start Date End Date Taking? Authorizing Provider  acetaminophen  (TYLENOL ) 500 MG tablet Take 2 tablets (1,000 mg total) by mouth every 6 (six) hours as needed. 02/28/17   Barrett, Rocky SAUNDERS, PA-C  amLODipine  (NORVASC ) 10 MG tablet Take 1 tablet (10 mg total) by mouth daily. 02/28/17   Barrett, Erin R, PA-C  anastrozole (ARIMIDEX) 1 MG tablet  06/04/18   [provider]  aspirin  EC 325 MG EC tablet Take 1 tablet (325 mg total) by mouth daily. 04/25/14   Mikhail, Maryann, DO  azelastine (ASTELIN) 0.1 % nasal spray Place into both nostrils. 01/15/24   [provider]  cetirizine  (ZYRTEC  ALLERGY) 10 MG tablet Take 1 tablet (10 mg total) by mouth daily. 10/26/19   Rainee Sweatt Sav[place], PA-C  [employer] F[employer] GUMMIES PO Take 2 capsules by mouth daily.    [provider]  dexamethasone  (OZURDEX ) 0.7 MG IMPL 0.7 mg by Intravitreal route every 3 (three) months.     [provider]  docusate sodium  (COLACE) 100 MG capsule Take 200 mg by mouth at bedtime.    [provider]  dorzolamide  (TRUSOPT ) 2 % ophthalmic solution Place 1  drop into both eyes 2 (two) times daily.    [provider]  EPINEPHrine 0.3 mg/0.3 mL IJ SOAJ injection  05/28/18   [provider]  ergocalciferol (VITAMIN D2) 1.25 MG (50000 UT) capsule Oral 04/18/14   [provider]  fluconazole  (DIFLUCAN ) 100 MG tablet 200 mg for 1 day then 100 mg for 13 days 08/15/22   Albertus Gordy HERO, MD  fluticasone OREN) 50 MCG/ACT nasal spray  05/28/18   [provider]  hydrochlorothiazide  (HYDRODIURIL ) 25 MG tablet Take 25 mg by mouth daily.    [provider]  lisinopril  (PRINIVIL ,ZESTRIL ) 5 MG tablet Take 1 tablet (5 mg total) by mouth daily. 02/28/17   Barrett, Erin R, PA-C  loteprednol (LOTEMAX) 0.5 % ophthalmic suspension 1 drop 3 (three) times daily. 02/26/24   [provider]  metFORMIN  (GLUCOPHAGE ) 500 MG tablet Oral 04/18/14   [provider]  MIEBO 1.338 GM/ML SOLN Apply 1 drop to eye 2 (two) times daily. 11/20/23   [provider]  nystatin (MYCOSTATIN) 100000 UNIT/ML suspension  05/29/18   [provider]  ondansetron  (ZOFRAN ) 8 MG tablet  04/27/18   [provider]  pantoprazole  (PROTONIX ) 40 MG tablet Take 80 mg by mouth daily.     [provider]  pioglitazone (ACTOS) 15 MG tablet Take 15 mg by mouth daily. 10/02/23   [provider]  potassium chloride  20  MEQ/15ML (10%) SOLN SMARTSIG:Milliliter(s) By Mouth 01/03/24   [provider]  potassium chloride  SA (K-DUR,KLOR-CON ) 20 MEQ tablet Take 1 tablet (20 mEq total) by mouth daily. 02/28/17   Barrett, Erin R, PA-C  pregabalin (LYRICA) 200 MG capsule Take 200 mg by mouth 2 (two) times daily. 11/15/23   [provider]  QUEtiapine  (SEROQUEL ) 25 MG tablet Take 25 mg by mouth at bedtime.    [provider]  rosuvastatin  (CRESTOR ) 20 MG tablet Take 20 mg by mouth every evening.    [provider]  rosuvastatin  (CRESTOR ) 40 MG tablet Take 40 mg by mouth daily. 05/22/24   [provider]  sertraline  (ZOLOFT ) 100 MG tablet Oral 04/18/14   [provider]  simvastatin  (ZOCOR ) 20 MG tablet Oral 04/18/14   [provider]  sucralfate  (CARAFATE ) 1 g tablet Take 1 tablet (1 g total) by mouth 4 (four) times daily -  with meals and at bedtime. Crush 1 tablet in 1 oz water and drink 5 min before meals for radiation induced esophagitis 09/04/23   Lanell Donald Stagger, PA-C  timolol  (BETIMOL ) 0.5 % ophthalmic solution Place 1 drop into both eyes 2 (two) times daily.    [provider]  timolol  (TIMOPTIC ) 0.5 % ophthalmic solution AS DIRECTED INSTILL 1 DROP IN BOTH EYES TWICE A DAY 02/19/17   [provider]  triamcinolone  cream (KENALOG) 0.1 % SMARTSIG:1 Application Topical 2-3 Times Daily 04/11/24   [provider]  TRYPTYR 0.003 % SOLN Apply 1 drop to eye 2 (two) times daily. 03/13/24   [provider]  urea (CARMOL) 40 % CREA Apply topically. 11/21/23   [provider]  venlafaxine  XR (EFFEXOR -XR) 37.5 MG 24 hr capsule Take 187.5 mg by mouth at bedtime.    [provider]  VEVYE 0.1 % SOLN Apply 1 drop to eye 2 (two) times daily. 01/25/24   [provider]  VICTOZA 18 MG/3ML SOPN Inject into the skin. 08/12/21   [provider]  XDEMVY 0.25 % SOLN  04/01/24   [provider]    Allergies: Shellfish allergy, Diaper rash products, Hydrochlorothiazide , Iodine, Lansoprazole, Metformin , Omeprazole, Pravastatin, Simvastatin , Tomato, Tomato (diagnostic), Bupropion, Empagliflozin, and Pregabalin    Review of Systems  Constitutional:  Positive for fatigue. Negative for chills and fever.  HENT:  Negative for congestion.   Eyes:  Negative for visual disturbance.  Respiratory:  Positive for cough, sputum production, chest tightness, shortness of breath and wheezing.   Cardiovascular:  Positive for leg swelling. Negative for chest pain and palpitations.  Gastrointestinal:  Negative for  abdominal pain, constipation, diarrhea, nausea and vomiting.  Genitourinary:  Negative for dysuria and flank pain.  Musculoskeletal:  Negative for back pain and neck pain.  Skin:  Negative for rash and wound.  Neurological:  Negative for weakness, light-headedness, numbness and headaches.  Psychiatric/Behavioral:  Negative for agitation and confusion.   All other systems reviewed and are negative.   Updated Vital Signs BP 103/64   Pulse 94   Temp 97.8 F (36.6 C)   Resp 16   Ht 5' 4 (1.626 m)   Wt 102.5 kg   LMP 09/27/2011   SpO2 100%   BMI 38.79 kg/m   Physical Exam Vitals and nursing note reviewed.  Constitutional:      General: She is not in acute distress.    Appearance: She is well-developed. She is not ill-appearing, toxic-appearing or diaphoretic.  HENT:     Head: Normocephalic and  atraumatic.  Eyes:     Conjunctiva/sclera: Conjunctivae normal.     Pupils: Pupils are equal, round, and reactive to light.  Cardiovascular:     Rate and Rhythm: Normal rate and regular rhythm.     Heart sounds: No murmur heard. Pulmonary:     Effort: Pulmonary effort is normal. Tachypnea present. No respiratory distress.     Breath sounds: Wheezing, rhonchi and rales present.  Chest:     Chest wall: No tenderness.  Abdominal:     Palpations: Abdomen is soft.     Tenderness: There is no abdominal tenderness.  Musculoskeletal:        General: No swelling.     Cervical back: Neck supple.     Right lower leg: No tenderness. Edema present.     Left lower leg: No tenderness. Edema present.  Skin:    General: Skin is warm and dry.     Capillary Refill: Capillary refill takes less than 2 seconds.     Findings: No erythema.  Neurological:     Mental Status: She is alert.  Psychiatric:        Mood and Affect: Mood normal.     (all labs ordered are listed, but only abnormal results are displayed) Labs Reviewed  CBC WITH DIFFERENTIAL/PLATELET - Abnormal; Notable for the following  components:      Result Value   RBC 3.11 (*)    Hemoglobin 8.1 (*)    HCT 29.7 (*)    MCHC 27.3 (*)    RDW 22.0 (*)    Lymphs Abs 0.5 (*)    All other components within normal limits  COMPREHENSIVE METABOLIC PANEL WITH GFR - Abnormal; Notable for the following components:   Potassium 3.1 (*)    Creatinine, Ser 1.05 (*)    GFR, Estimated 59 (*)    All other components within normal limits  PRO BRAIN NATRIURETIC PEPTIDE - Abnormal; Notable for the following components:   Pro Brain Natriuretic Peptide 2,443.0 (*)    All other components within normal limits  I-STAT CHEM 8, ED - Abnormal; Notable for the following components:   Potassium 3.1 (*)    Creatinine, Ser 1.20 (*)    Calcium , Ion 1.11 (*)    Hemoglobin 9.2 (*)    HCT 27.0 (*)    All other components within normal limits  CBG MONITORING, ED - Abnormal; Notable for the following components:   Glucose-Capillary 104 (*)    All other components within normal limits  CBG MONITORING, ED - Abnormal; Notable for the following components:   Glucose-Capillary 253 (*)    All other components within normal limits  TROPONIN T, HIGH SENSITIVITY - Abnormal; Notable for the following components:   Troponin T High Sensitivity 36 (*)    All other components within normal limits  TROPONIN T, HIGH SENSITIVITY - Abnormal; Notable for the following components:   Troponin T High Sensitivity 34 (*)    All other components within normal limits  RESP PANEL BY RT-PCR (RSV, FLU A&B, COVID)  RVPGX2  D-DIMER, QUANTITATIVE  HIV ANTIBODY (ROUTINE TESTING W REFLEX)  HEMOGLOBIN A1C  BASIC METABOLIC PANEL WITH GFR  CBC  I-STAT CG4 LACTIC ACID, ED  I-STAT CG4 LACTIC ACID, ED    EKG: EKG Interpretation Date/Time:  Tuesday July 02 2024 15:09:57 EST Ventricular Rate:  88 PR Interval:  170 QRS Duration:  109 QT Interval:  420 QTC Calculation: 509 R Axis:   239  Text Interpretation: Sinus rhythm RVH with  secondary repolarization abnrm Prolonged  QT interval when compared to prior, similar appearance with slightly longer QTc No STEMI Confirmed by Ginger Barefoot ([id number]) on 07/02/2024 3:51:47 PM  Radiology: [facility] Chest 2 View Result Date: 07/02/2024 CLINICAL DATA:  Shortness of breath, cough EXAM: CHEST - 2 VIEW COMPARISON:  10/26/2019 FINDINGS: Frontal and lateral views of the chest demonstrate an enlarged cardiac silhouette. There is patchy right perihilar airspace disease and trace right pleural effusion. Left chest is clear. No pneumothorax. IMPRESSION: 1. Patchy right perihilar airspace disease and trace right effusion, favor bronchopneumonia over asymmetric edema. 2. Enlarged cardiac silhouette. Electronically Signed   By: Ozell Daring M.D.   On: 07/02/2024 16:02     Procedures   CRITICAL CARE Performed by: Lonni PARAS Stacie Knutzen Total critical care time: 35 minutes Critical care time was exclusive of separately billable procedures and treating other patients. Critical care was necessary to treat or prevent imminent or life-threatening deterioration. Critical care was time spent personally by me on the following activities: development of treatment plan with patient and/or surrogate as well as nursing, discussions with consultants, evaluation of patient's response to treatment, examination of patient, obtaining history from patient or surrogate, ordering and performing treatments and interventions, ordering and review of laboratory studies, ordering and review of radiographic studies, pulse oximetry and re-evaluation of patient's condition.  Medications Ordered in the ED  enoxaparin  (LOVENOX ) injection 40 mg (40 mg Subcutaneous Given 07/02/24 1837)  insulin  aspart (novoLOG ) injection 0-15 Units (has no administration in time range)  insulin  aspart (novoLOG ) injection 0-5 Units (3 Units Subcutaneous Given 07/02/24 2143)  budesonide  (PULMICORT ) nebulizer solution 2 mg (2 mg Nebulization Given 07/02/24 2105)  predniSONE  (DELTASONE ) tablet 40  mg (has no administration in time range)  albuterol  (PROVENTIL ) (2.5 MG/3ML) 0.083% nebulizer solution 2.5 mg (2.5 mg Nebulization Given 07/02/24 1739)  cefTRIAXone  (ROCEPHIN ) 2 g in sodium chloride  0.9 % 100 mL IVPB (has no administration in time range)  azithromycin  (ZITHROMAX ) tablet 500 mg (has no administration in time range)  methylPREDNISolone  sodium succinate (SOLU-MEDROL ) 125 mg/2 mL injection 125 mg (125 mg Intravenous Given 07/02/24 1610)  ipratropium-albuterol  (DUONEB) 0.5-2.5 (3) MG/3ML nebulizer solution 3 mL (3 mLs Nebulization Given 07/02/24 1613)  magnesium  sulfate IVPB 2 g 50 mL (0 g Intravenous Stopped 07/02/24 1713)  cefTRIAXone  (ROCEPHIN ) 1 g in sodium chloride  0.9 % 100 mL IVPB (0 g Intravenous Stopped 07/02/24 1826)  azithromycin  (ZITHROMAX ) tablet 500 mg (500 mg Oral Given 07/02/24 1733)                                    Medical Decision Making Amount and/or Complexity of Data Reviewed Labs: ordered.  Risk Prescription drug management. Decision regarding hospitalization.    Susan Farley is a 64 y.o. female with a past medical history significant for hypertension, hyperlipidemia, GERD, previous stroke, previous lung cancer status post lobectomy and previous chemotherapy who is now getting infusion therapy treatments, previous pulmonary embolism, and asthma who presents with hypoxia, shortness of breath, and URI symptoms.  According to patient and documentation from facility, patient has had URI symptoms with shortness breath for about a week.  She had lost of her taste several days ago and has not yet been tested for COVID flu or RSV.  She was found have oxygen saturations in the 70s on room air and she does not take oxygen normally.  She is on 3 L now  to keep oxygen saturations in the 90s.  She was given a breathing treatment with EMS and was still having wheezing.  She reports she has had cough with phlegm but no hemoptysis.  She is denying chest pain or palpitations  and is denying nausea, vomiting, constipation, diarrhea, or urinary changes.  She does report some edema in her legs that is worse than baseline.  She denies any trauma or pains at this time.  On exam, lungs do have rales, rhonchi, and wheezing diffusely.  Chest was nontender.  Abdomen nontender.  I did hear good bowel sounds.  Legs have some mild edema bilaterally but had intact sensation and strength.  Patient has a dry mouth but otherwise is resting.  Patient blood pressure is in the 90s systolic but she is afebrile.  She is not tachycardic but was tachypneic when I met her.  Oxygen saturation in the 90s on 3 L now.  EKG does not show STEMI.  Given the patient's new oxygen requirement I anticipate she will need admission.  Will treat for possible asthma exacerbation with a DuoNeb, steroids, and magnesium .  With her history of pulmonary embolism not on blood thinners will consider thromboembolic etiology however she appears to have an iodine allergy so we will hold on a contrasted scan initially and will get a D-dimer.  If it is negative suspect no PE but if it is positive, patient may need inpatient VQ scan.  She will also get chest x-ray to look for pneumothorax although her breath sounds were symmetric.  Will get screening labs including troponin and BNP given the new edema in the legs and the fatigue.  Will look for infection with COVID, flu, or RSV with her taste changes with URI symptoms and get a swab.  Anticipate reassessment after workup but anticipate admission given the hypoxia.  4:21 PM X-ray returned showing evidence of pneumonia.  proBNP elevated.  Troponin is elevated at 36, will trend.  Still waiting on the viral swab to return however will get antibiotics and call for admission.  D-dimer was negative, doubt thromboembolic disease at this time.  Will call for admission.  Viral swab still in process.     Final diagnoses:  Hypoxia  Pneumonia due to infectious organism, unspecified  laterality, unspecified part of lung  Shortness of breath      Clinical Impression: 1. Hypoxia   2. Pneumonia due to infectious organism, unspecified laterality, unspecified part of lung   3. Shortness of breath     Disposition: Admit  This note was prepared with assistance of Dragon voice recognition software. Occasional wrong-word or sound-a-like substitutions may have occurred due to the inherent limitations of voice recognition software.      Joshalyn Ancheta, Lonni PARAS, MD 07/02/24 2312  "

## 2024-07-03 ENCOUNTER — Inpatient Hospital Stay (HOSPITAL_COMMUNITY)

## 2024-07-03 DIAGNOSIS — I5031 Acute diastolic (congestive) heart failure: Secondary | ICD-10-CM

## 2024-07-03 DIAGNOSIS — R0602 Shortness of breath: Secondary | ICD-10-CM

## 2024-07-03 DIAGNOSIS — J189 Pneumonia, unspecified organism: Principal | ICD-10-CM

## 2024-07-03 LAB — ECHOCARDIOGRAM COMPLETE
AR max vel: 1.93 cm2
AV Area VTI: 2.23 cm2
AV Area mean vel: 2.18 cm2
AV Mean grad: 12 mmHg
AV Peak grad: 23.2 mmHg
Ao pk vel: 2.41 m/s
Area-P 1/2: 3.6 cm2
Calc EF: 66.9 %
Height: 64 in
S' Lateral: 3 cm
Single Plane A2C EF: 70.7 %
Single Plane A4C EF: 61 %
Weight: 3616 [oz_av]

## 2024-07-03 LAB — CBC
HCT: 27.7 % — ABNORMAL LOW (ref 36.0–46.0)
Hemoglobin: 7.6 g/dL — ABNORMAL LOW (ref 12.0–15.0)
MCH: 26 pg (ref 26.0–34.0)
MCHC: 27.4 g/dL — ABNORMAL LOW (ref 30.0–36.0)
MCV: 94.9 fL (ref 80.0–100.0)
Platelets: 298 K/uL (ref 150–400)
RBC: 2.92 MIL/uL — ABNORMAL LOW (ref 3.87–5.11)
RDW: 21.8 % — ABNORMAL HIGH (ref 11.5–15.5)
WBC: 6.4 K/uL (ref 4.0–10.5)
nRBC: 0 % (ref 0.0–0.2)

## 2024-07-03 LAB — BASIC METABOLIC PANEL WITH GFR
Anion gap: 9 (ref 5–15)
BUN: 16 mg/dL (ref 8–23)
CO2: 30 mmol/L (ref 22–32)
Calcium: 9.2 mg/dL (ref 8.9–10.3)
Chloride: 103 mmol/L (ref 98–111)
Creatinine, Ser: 0.96 mg/dL (ref 0.44–1.00)
GFR, Estimated: >60 mL/min
Glucose, Bld: 110 mg/dL — ABNORMAL HIGH (ref 70–99)
Potassium: 3.7 mmol/L (ref 3.5–5.1)
Sodium: 142 mmol/L (ref 135–145)

## 2024-07-03 LAB — GLUCOSE, CAPILLARY
Glucose-Capillary: 138 mg/dL — ABNORMAL HIGH (ref 70–99)
Glucose-Capillary: 105 mg/dL — ABNORMAL HIGH (ref 70–99)

## 2024-07-03 LAB — CBG MONITORING, ED
Glucose-Capillary: 91 mg/dL (ref 70–99)
Glucose-Capillary: 142 mg/dL — ABNORMAL HIGH (ref 70–99)

## 2024-07-03 LAB — TROPONIN T, HIGH SENSITIVITY: Troponin T High Sensitivity: 32 ng/L — ABNORMAL HIGH (ref 0–19)

## 2024-07-03 MED ORDER — FUROSEMIDE 10 MG/ML IJ SOLN
40.0000 mg | Freq: Once | INTRAMUSCULAR | Status: AC
  Administered 2024-07-03: 40 mg via INTRAVENOUS
  Filled 2024-07-03: qty 4

## 2024-07-03 NOTE — Progress Notes (Signed)
 [facility]  PROGRESS NOTE  Susan Farley FMW:[id number] DOB: [birth year] DOA: 07/02/2024 PCP: [facility]   Brief HPI:    64 y.o. 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     Patient presents to our facility with worsening dyspnea, nasal congestion and cough found to be notably hypoxic with reported sick contacts over the holidays.  No recent travel other than locally for family holiday events.  Given profound hypoxia from baseline hospitalist called for admission.      Assessment/Plan:   Acute hypoxic respiratory failure, COPD versus asthma exacerbation, POA Concurrent community-acquired pneumonia, POA - Home med rec pending, restart LABA Jhoselyn Ruffini ICS as appropriate - Continue as needed nebs, IV steroids x 1, transition to p.o. prednisone  i - Wheeze already markedly improved although remains hypoxic even at rest (baseline is on room air) - X-ray with notable right middle to upper lobe opacity concerning for overlying infection, azithromycin , ceftriaxone  x 5 days - No clear indication for infection, flu RSV COVID swab negative  Acute diastolic CHF -BNP significantly elevated at 2443 -Diuresed well with IV Lasix  40 mg x 1 -Follow BMP in am -Check echocardiogram   Non-insulin -dependent diabetes type 2  A1c 5.6, well-controlled - Continue sliding scale insulin , hypoglycemic protocol - Diabetic diet initiated   Lung cancer, under ongoing treatment -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) - Resection 2018 - Currently on immunotherapy   OSA/obesity hypoventilation syndrome noncompliant with cpap  -willing to trial CPAP here  HLD -resume home medications once verified  Tobacco abuse, ongoing -lengthy  discussion in regards to cessation given above acute hypoxia, COPD, cancer history      DVT prophylaxis: Lovenox   Medications     azithromycin   500 mg Oral Daily   budesonide  (PULMICORT ) nebulizer solution  2 mg Nebulization Q12H   enoxaparin  (LOVENOX ) injection  40 mg Subcutaneous Q24H   insulin  aspart  0-15 Units Subcutaneous TID WC   insulin  aspart  0-5 Units Subcutaneous QHS   predniSONE   40 mg Oral Q breakfast     Data Reviewed:   CBG:  Recent Labs  Lab 07/02/24 1833 07/02/24 2139 07/03/24 0757  GLUCAP 104* 253* 91    SpO2: 97 % O2 Flow Rate (L/min): 4 L/min    Vitals:   07/03/24 0500 07/03/24 0530 07/03/24 0600 07/03/24 0612  BP: 125/69 116/86 118/71 118/71  Pulse: 80 84 86 86  Resp: 12 14 14 15   Temp:    97.7 F (36.5 C)  TempSrc:      SpO2: 97% 98% 98% 97%  Weight:      Height:          Data Reviewed:  Basic Metabolic Panel: Recent Labs  Lab 07/02/24 1528 07/02/24 1539 07/03/24 0448  NA 142 142 142  K 3.1* 3.1* 3.7  CL 102 100 103  CO2 31  --  30  GLUCOSE 72 72 110*  BUN 15 15 16   CREATININE 1.05* 1.20* 0.96  CALCIUM  9.4  --  9.2    CBC: Recent Labs  Lab 07/02/24 1528 07/02/24 1539 07/03/24 0448  WBC 5.4  --  6.4  NEUTROABS 4.4  --   --   HGB 8.1* 9.2* 7.6*  HCT 29.7* 27.0* 27.7*  MCV 95.5  --  94.9  PLT 270  --  298    LFT Recent Labs  Lab 07/02/24 1528  AST 29  ALT 24  ALKPHOS 87  BILITOT 0.2  PROT 6.5  ALBUMIN 3.6     Antibiotics: Anti-infectives (From admission, onward)    Start     Dose/Rate Route Frequency Ordered Stop   07/03/24 1700  cefTRIAXone  (ROCEPHIN ) 2 g in sodium chloride  0.9 % 100 mL IVPB        2 g 200 mL/hr over 30 Minutes Intravenous Every 24 hours 07/02/24 1905 07/08/24 1659   07/03/24 1000  azithromycin  (ZITHROMAX ) tablet 500 mg        500 mg Oral Daily 07/02/24 1905 07/08/24 0959   07/02/24 1630  cefTRIAXone  (ROCEPHIN ) 1 g in sodium chloride  0.9 % 100 mL IVPB        1 g 200 mL/hr  over 30 Minutes Intravenous  Once 07/02/24 1623 07/02/24 1826   07/02/24 1630  azithromycin  (ZITHROMAX ) tablet 500 mg        500 mg Oral  Once 07/02/24 1623 07/02/24 1733        CONSULTS   Code Status:   Family Communication:      Subjective   Denies shortness of breath.   Objective    Physical Examination:   General-appears in no acute distress Heart-S1-S2, regular, no murmur auscultated Lungs-bilateral rhonchi auscultated Abdomen-soft, nontender, no organomegaly Extremities-no edema in the lower extremities Neuro-alert, oriented x3, no focal deficit noted           Shaney Deckman S Lauris Keepers   [facility] If 7PM-7AM, please contact night-coverage at [email or web address], Office  [phone]   07/03/2024, 8:31 AM  LOS: 1 day

## 2024-07-03 NOTE — Progress Notes (Signed)
 PT Cancellation Note  Patient Details Name: Susan Farley MRN: [id number] DOB: 10/18/1960   Cancelled Treatment:    Reason Eval/Treat Not Completed: Patient not medically ready. Pt with increased SOB. Will continue to monitor.    Rodgers ORN [facility] 07/03/2024, 11:35 AM Rodgers Opal PT Acute Rehabilitation Services Office [phone]

## 2024-07-03 NOTE — Progress Notes (Signed)
 PT Cancellation Note  Patient Details Name: Susan Farley MRN: [id number] DOB: 05-07-1961   Cancelled Treatment:    Reason Eval/Treat Not Completed: Other (comment). Pt in process of being transferred to room.    Rodgers ORN [facility] 07/03/2024, 3:20 PM Rodgers Opal PT Acute [employer] [phone]

## 2024-07-03 NOTE — ED Notes (Signed)
 ECHO at bedside.

## 2024-07-03 NOTE — ED Notes (Signed)
Pt given ginger ale to drink. 

## 2024-07-04 DIAGNOSIS — J449 Chronic obstructive pulmonary disease, unspecified: Secondary | ICD-10-CM | POA: Diagnosis not present

## 2024-07-04 DIAGNOSIS — J9601 Acute respiratory failure with hypoxia: Secondary | ICD-10-CM

## 2024-07-04 DIAGNOSIS — C3490 Malignant neoplasm of unspecified part of unspecified bronchus or lung: Secondary | ICD-10-CM

## 2024-07-04 DIAGNOSIS — G4733 Obstructive sleep apnea (adult) (pediatric): Secondary | ICD-10-CM

## 2024-07-04 DIAGNOSIS — I5033 Acute on chronic diastolic (congestive) heart failure: Secondary | ICD-10-CM

## 2024-07-04 LAB — CBC
HCT: 29.6 % — ABNORMAL LOW (ref 36.0–46.0)
Hemoglobin: 8.2 g/dL — ABNORMAL LOW (ref 12.0–15.0)
MCH: 25.9 pg — ABNORMAL LOW (ref 26.0–34.0)
MCHC: 27.7 g/dL — ABNORMAL LOW (ref 30.0–36.0)
MCV: 93.4 fL (ref 80.0–100.0)
Platelets: 283 K/uL (ref 150–400)
RBC: 3.17 MIL/uL — ABNORMAL LOW (ref 3.87–5.11)
RDW: 21.7 % — ABNORMAL HIGH (ref 11.5–15.5)
WBC: 8.1 K/uL (ref 4.0–10.5)
nRBC: 0 % (ref 0.0–0.2)

## 2024-07-04 LAB — COMPREHENSIVE METABOLIC PANEL WITH GFR
ALT: 19 U/L (ref 0–44)
AST: 20 U/L (ref 15–41)
Albumin: 3.6 g/dL (ref 3.5–5.0)
Alkaline Phosphatase: 79 U/L (ref 38–126)
Anion gap: 10 (ref 5–15)
BUN: 15 mg/dL (ref 8–23)
CO2: 32 mmol/L (ref 22–32)
Calcium: 9.6 mg/dL (ref 8.9–10.3)
Chloride: 103 mmol/L (ref 98–111)
Creatinine, Ser: 0.87 mg/dL (ref 0.44–1.00)
GFR, Estimated: >60 mL/min
Glucose, Bld: 103 mg/dL — ABNORMAL HIGH (ref 70–99)
Potassium: 2.8 mmol/L — ABNORMAL LOW (ref 3.5–5.1)
Sodium: 145 mmol/L (ref 135–145)
Total Bilirubin: 0.3 mg/dL (ref 0.0–1.2)
Total Protein: 6.5 g/dL (ref 6.5–8.1)

## 2024-07-04 LAB — GLUCOSE, CAPILLARY
Glucose-Capillary: 99 mg/dL (ref 70–99)
Glucose-Capillary: 127 mg/dL — ABNORMAL HIGH (ref 70–99)
Glucose-Capillary: 157 mg/dL — ABNORMAL HIGH (ref 70–99)
Glucose-Capillary: 110 mg/dL — ABNORMAL HIGH (ref 70–99)

## 2024-07-04 LAB — POTASSIUM: Potassium: 4.1 mmol/L (ref 3.5–5.1)

## 2024-07-04 MED ORDER — ALPRAZOLAM 0.5 MG PO TABS
0.5000 mg | ORAL_TABLET | Freq: Three times a day (TID) | ORAL | Status: DC | PRN
  Administered 2024-07-04 – 2024-07-06 (×6): 0.5 mg via ORAL
  Filled 2024-07-04: qty 2
  Filled 2024-07-04 (×5): qty 1

## 2024-07-04 MED ORDER — POTASSIUM CHLORIDE 20 MEQ PO PACK
40.0000 meq | PACK | Freq: Three times a day (TID) | ORAL | Status: AC
  Administered 2024-07-04 (×3): 40 meq via ORAL
  Filled 2024-07-04 (×3): qty 2

## 2024-07-04 MED ORDER — ROSUVASTATIN CALCIUM 20 MG PO TABS
40.0000 mg | ORAL_TABLET | Freq: Every day | ORAL | Status: DC
  Administered 2024-07-04 – 2024-07-06 (×3): 40 mg via ORAL
  Filled 2024-07-04 (×3): qty 2

## 2024-07-04 MED ORDER — ENSURE MAX PROTEIN PO LIQD
11.0000 [oz_av] | Freq: Every day | ORAL | Status: DC
  Administered 2024-07-04 – 2024-07-06 (×2): 11 [oz_av] via ORAL
  Filled 2024-07-04 (×4): qty 330

## 2024-07-04 MED ORDER — ADULT MULTIVITAMIN W/MINERALS CH
1.0000 | ORAL_TABLET | Freq: Every day | ORAL | Status: DC
  Administered 2024-07-04 – 2024-07-06 (×3): 1 via ORAL
  Filled 2024-07-04 (×3): qty 1

## 2024-07-04 NOTE — Progress Notes (Signed)
 [facility]  PROGRESS NOTE  HALLEE MCKENNY FMW:[id number] DOB: 01/29/1961 DOA: 07/02/2024 PCP: [facility]   Brief HPI:    64 y.o. 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     Patient presents to our facility with worsening dyspnea, nasal congestion and cough found to be notably hypoxic with reported sick contacts over the holidays.  No recent travel other than locally for family holiday events.  Given profound hypoxia from baseline hospitalist called for admission.      Assessment/Plan:   Acute hypoxic respiratory failure, COPD versus asthma exacerbation, POA Concurrent community-acquired pneumonia, POA - Home med rec pending, restart LABA Demetri Kerman ICS as appropriate - Continue as needed nebs, IV steroids x 1, transition to p.o. prednisone  i - Wheeze already markedly improved although remains hypoxic even at rest (baseline is on room air) - X-ray with notable right middle to upper lobe opacity concerning for overlying infection, azithromycin , ceftriaxone  x 5 days - No clear indication for infection, flu RSV COVID swab negative  Acute diastolic CHF -BNP significantly elevated at 2443 -Diuresed well with IV Lasix  40 mg x 1 -Echocardiogram showed EF of 60 to 65% with grade 1 diastolic dysfunction. -Blood pressure is soft, likely will need more diuresis. -Will consult cardiology to help with diuresis.  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) -  Resection 2018 - Currently on immunotherapy   OSA/obesity hypoventilation syndrome noncompliant with cpap  -willing to trial CPAP here  HLD - Continue rosuvastatin   Tobacco abuse, ongoing -lengthy discussion in regards to cessation given above acute hypoxia, COPD, cancer history      DVT prophylaxis: Lovenox   Medications     azithromycin   500 mg Oral Daily   budesonide  (PULMICORT ) nebulizer solution  2 mg Nebulization Q12H   enoxaparin  (LOVENOX ) injection  40 mg Subcutaneous Q24H   insulin  aspart  0-15 Units Subcutaneous TID WC   insulin  aspart  0-5 Units Subcutaneous QHS   potassium chloride   40 mEq Oral TID   predniSONE   40 mg Oral Q breakfast     Data Reviewed:   CBG:  Recent Labs  Lab 07/02/24 2139 07/03/24 0757 07/03/24 1121 07/03/24 1651 07/03/24 2228  GLUCAP 253* 91 142* 138* 105*    SpO2: 100 % O2 Flow Rate (L/min): 4 L/min    Vitals:   07/03/24 2016 07/04/24 0016 07/04/24 0414 07/04/24 0728  BP:  104/61 120/64 106/67  Pulse:  64 99 95  Resp:  17 17 19   Temp:  97.8 F (36.6 C) (!) 97.5 F (36.4 C) 97.6 F (36.4 C)  TempSrc:      SpO2: 98% 98% 92% 100%  Weight:      Height:          Data Reviewed:  Basic Metabolic Panel: Recent Labs  Lab 07/02/24 1528 07/02/24 1539 07/03/24 0448 07/04/24 0229  NA 142 142 142 145  K 3.1* 3.1* 3.7 2.8*  CL 102 100 103 103  CO2 31  --  30 32  GLUCOSE 72 72 110* 103*  BUN 15 15 16  15  CREATININE 1.05* 1.20* 0.96 0.87  CALCIUM  9.4  --  9.2 9.6    CBC: Recent Labs  Lab 07/02/24 1528 07/02/24 1539 07/03/24 0448 07/04/24 0229  WBC 5.4  --  6.4 8.1  NEUTROABS 4.4  --   --   --   HGB 8.1* 9.2* 7.6* 8.2*  HCT 29.7* 27.0* 27.7* 29.6*  MCV 95.5  --  94.9 93.4  PLT 270  --  298 283    LFT Recent Labs  Lab 07/02/24 1528 07/04/24 0229  AST 29 20  ALT 24 19  ALKPHOS 87 79  BILITOT 0.2 0.3  PROT 6.5 6.5  ALBUMIN 3.6 3.6     Antibiotics: Anti-infectives (From admission, onward)     Start     Dose/Rate Route Frequency Ordered Stop   07/03/24 1700  cefTRIAXone  (ROCEPHIN ) 2 g in sodium chloride  0.9 % 100 mL IVPB        2 g 200 mL/hr over 30 Minutes Intravenous Every 24 hours 07/02/24 1905 07/08/24 1659   07/03/24 1000  azithromycin  (ZITHROMAX ) tablet 500 mg        500 mg Oral Daily 07/02/24 1905 07/08/24 0959   07/02/24 1630  cefTRIAXone  (ROCEPHIN ) 1 g in sodium chloride  0.9 % 100 mL IVPB        1 g 200 mL/hr over 30 Minutes Intravenous  Once 07/02/24 1623 07/02/24 1826   07/02/24 1630  azithromycin  (ZITHROMAX ) tablet 500 mg        500 mg Oral  Once 07/02/24 1623 07/02/24 1733        CONSULTS   Code Status:   Family Communication:      Subjective   Patient seen and examined, feels fatigued.  Diuresed well with IV Lasix .  Net -2.2 L.   Objective    Physical Examination:  Appears in mild respiratory distress S1-S2, regular, no murmur auscultated Lungs-decreased breath sounds lung bases Abdomen is soft, nontender, no organomegaly Extremities no edema           Veron Senner S Kyelle Urbas   [facility] If 7PM-7AM, please contact night-coverage at [email or web address], Office  [phone]   07/04/2024, 8:10 AM  LOS: 2 days

## 2024-07-04 NOTE — Plan of Care (Signed)
  Problem: Education: Goal: Ability to describe self-care measures that may prevent or decrease complications (Diabetes Survival Skills Education) will improve Outcome: Progressing Goal: Individualized Educational Video(s) Outcome: Progressing   Problem: Skin Integrity: Goal: Risk for impaired skin integrity will decrease Outcome: Progressing   Problem: Safety: Goal: Ability to remain free from injury will improve Outcome: Progressing

## 2024-07-04 NOTE — Plan of Care (Signed)

## 2024-07-04 NOTE — Evaluation (Signed)
 Occupational Therapy Evaluation Patient Details Name: Susan Farley MRN: [id number] DOB: 1960-10-21 Today's Date: 07/04/2024   History of Present Illness   Pt is 64 year old presented [facility] on  07/02/24 for SOB. Pt with acute respiratory failure with hypoxia due to copd vs asthma vs PNA. PMH - DM II, HTN, CVA without residual, asthma, lung CA, PE depression, breast CA, neuropathy.     Clinical Impressions At baseline, pt reports she completes ADLs Independent to Mod I, IADLs with Mod I, and functional mobility Independent. However, pt also reports difficulty at baseline with LB ADLs, meal prep, home management tasks, driving, and mobility. Pt now presents with decreased activity tolerance, increased fatigue, impaired cardiopulmonary status, decreased balance, impaired cognition (uncertain of baseline), and decreased safety and independence with functional tasks. Pt largely demonstrating ability to complete UB ADLs with Set up to Min assist, LB ADLs with Max assist, bed mobility with Supervision to Contact guard assist, and functional transfers with Contact guard assist. Pt participated well in session, but was limited by fatigue, impaired cardiopulmonary status, and signs of anxiety when feeling SOB. Pt with O2 sat >/ 93% on 4L continuous O2 through nasal cannula throughout session. OT notified RN of pt reported feeling of SOB, O2 sats during session, and pt report of new tingling in B LE. Pt will benefit in acute skilled OT services to address deficits, provide education in use of AE for LB ADLs and in energy conservation strategies, and to increase pt safety and independence with functional tasks. Post acute discharge, pt will benefit from [facility] OT to maximize rehab potential and decrease risk of rehospitalization.      If plan is discharge home, recommend the following:   A little help with walking and/or transfers;A lot of help with bathing/dressing/bathroom;Assistance with  cooking/housework;Direct supervision/assist for medications management;Direct supervision/assist for financial management;Assist for transportation;Help with stairs or ramp for entrance     Functional Status Assessment   Patient has had a recent decline in their functional status and demonstrates the ability to make significant improvements in function in a reasonable and predictable amount of time.     Equipment Recommendations   BSC/3in1;Tub/shower seat;Other (comment) (AE for LB ADLs)     Recommendations for Other Services         Precautions/Restrictions   Precautions Precautions: Fall;Other (comment) Precaution/Restrictions Comments: watch SpO2 Restrictions Weight Bearing Restrictions Per Provider Order: No     Mobility Bed Mobility Overal bed mobility: Needs Assistance Bed Mobility: Supine to Sit, Sit to Supine     Supine to sit: HOB elevated, Contact guard Sit to supine: Supervision   General bed mobility comments: assist for trunk support to sit, to supine with S and cues, requesting HOB up    Transfers Overall transfer level: Needs assistance Equipment used: None Transfers: Sit to/from Stand, Bed to chair/wheelchair/BSC Sit to Stand: Contact guard assist     Step pivot transfers: Contact guard assist     General transfer comment: cues for hand placement/technique      Balance Overall balance assessment: Needs assistance Sitting-balance support: Single extremity supported, No upper extremity supported, Feet supported Sitting balance-Leahy Scale: Good     Standing balance support: No upper extremity supported, During functional activity Standing balance-Leahy Scale: Fair Standing balance comment: standing during ADLs with Supervision to Contact guard assist for safety without an AD  ADL either performed or assessed with clinical judgement   ADL Overall ADL's : Needs assistance/impaired Eating/Feeding: Set  up;Sitting   Grooming: Set up;Sitting   Upper Body Bathing: Contact guard assist;Minimal assistance;Sitting   Lower Body Bathing: Maximal assistance;Sit to/from stand;Cueing for compensatory techniques;Cueing for safety   Upper Body Dressing : Contact guard assist;Sitting   Lower Body Dressing: Maximal assistance;Sit to/from stand;Cueing for compensatory techniques   Toilet Transfer: Contact guard assist;BSC/3in1 (step-pivot; without an AD)   Toileting- Clothing Manipulation and Hygiene: Maximal assistance;Sit to/from stand;Cueing for compensatory techniques         General ADL Comments: Pt with significantly decreased activity tolerance, fatiguing quickly during tasks.     Vision Baseline Vision/History: 1 Wears glasses Ability to See in Adequate Light: 0 Adequate Patient Visual Report: No change from baseline Additional Comments: VIsion WFL for tasks assessed with glasses on; however, pt largely keeping eyes closed during session unless cued to open eyes even though pt was alert throughout session; vision not formally screened or evaluated     Perception         Praxis         Pertinent Vitals/Pain Pain Assessment Pain Assessment: Faces Faces Pain Scale: Hurts even more Pain Location: abdomen; B LE Pain Descriptors / Indicators: Discomfort, Tingling (discomfort in abdomen; tingling in B LE) Pain Intervention(s): Monitored during session, Limited activity within patient's tolerance, Repositioned, Other (comment) (Informed RN of pt report of new ingling in B LE and continued abdominal pain)     Extremity/Trunk Assessment Upper Extremity Assessment Upper Extremity Assessment: Right hand dominant;Generalized weakness   Lower Extremity Assessment Lower Extremity Assessment: Defer to PT evaluation       Communication Communication Communication: No apparent difficulties   Cognition Arousal: Alert Behavior During Therapy: Anxious, WFL for tasks assessed/performed  (Largely WFL but with signs of anxiety when feeling SOB) Cognition: Cognition impaired, No family/caregiver present to determine baseline     Awareness: Intellectual awareness intact, Online awareness impaired Memory impairment (select all impairments): Working memory Attention impairment (select first level of impairment): Selective attention (Pt easily internally distracted) Executive functioning impairment (select all impairments): Organization, Reasoning, Problem solving OT - Cognition Comments: Pt AAOx4 and agreeable throughout session, but limited by deficits noted above, including signs of anxiety with feeling of SOB (with O2 sat >/93% on 4L continuous O2 through Advance). Pt largely keeping eyes closed during session unless cued to open them even though pt was alert throughout session.                 Following commands: Impaired Following commands impaired: Only follows one step commands consistently, Follows multi-step commands inconsistently, Follows multi-step commands with increased time     Cueing  General Comments   Cueing Techniques: Verbal cues  O2 sat >/ 93% on 4L continuous O2 through nasal cannula; however, pt reporting feeling of SOB throughout. Pt instructed in PLB but with impaired ability to following istructions secondary to signs of anxiety. RN notified of pt report. Pt also reporting new ingling in B LE with RN notified. NT notified of pt request for new purewick.   Exercises     Shoulder Instructions      Home Living Family/patient expects to be discharged to:: Private residence Living Arrangements: Alone Available Help at Discharge: Other (Comment) (Pt reports no assistance available) Type of Home: House Home Access: Level entry     Home Layout: One level     Bathroom Shower/Tub: [employer]  Toilet: Standard     Home Equipment: Cane - single point;Hand held shower head          Prior Functioning/Environment Prior Level of  Function : Independent/Modified Independent             Mobility Comments: Ind; denies falls, but some difficulty getting around recently ADLs Comments: Pt reports she is Independent to Mod I with ADLs. However, pt reports that at baseline she is unable to don/doff socks, wears slip-on shoes, and has difficulty washing/drying feet. Pt reports she is performing meal prep, home management tasks, and driving, but reports these activities have become increasingly difficult and she feels she would benefit from assistance    OT Problem List: Decreased strength;Decreased activity tolerance;Impaired balance (sitting and/or standing);Decreased safety awareness;Decreased knowledge of use of DME or AE;Decreased knowledge of precautions;Cardiopulmonary status limiting activity   OT Treatment/Interventions: Self-care/ADL training;Therapeutic exercise;Energy conservation;DME and/or AE instruction;Therapeutic activities;Cognitive remediation/compensation;Patient/family education;Balance training      OT Goals(Current goals can be found in the care plan section)   Acute Rehab OT Goals Patient Stated Goal: to feel better and to be able to fell like she can catch her breath OT Goal Formulation: With patient Time For Goal Achievement: 07/18/24 Potential to Achieve Goals: Fair ADL Goals Pt Will Perform Grooming: with modified independence;standing Pt Will Perform Lower Body Bathing: with contact guard assist;sitting/lateral leans;sit to/from stand;with adaptive equipment Pt Will Perform Lower Body Dressing: with contact guard assist;sitting/lateral leans;sit to/from stand;with adaptive equipment Pt Will Transfer to Toilet: with modified independence;ambulating;bedside commode (with least restrictive AD) Pt Will Perform Toileting - Clothing Manipulation and hygiene: with contact guard assist;sitting/lateral leans;sit to/from stand (with adaptive equipment as needed) Additional ADL Goal #1: Patient will  demonstrate understanding through teach back of education in energy conservation strategies, including pursed lip breathing, to increase safety and independence with functional tasks with handouts provided. Additional ADL Goal #2: Patient will demonstrate ability to participate in 5 or more minutes of funcitonal or therapeutic tasks prior to needing a rest break to increase safety and independence with functional tasks.   OT Frequency:  Min 2X/week    Co-evaluation              AM-PAC OT 6 Clicks Daily Activity     Outcome Measure Help from another person eating meals?: A Little Help from another person taking care of personal grooming?: A Little Help from another person toileting, which includes using toliet, bedpan, or urinal?: A Lot Help from another person bathing (including washing, rinsing, drying)?: A Lot Help from another person to put on and taking off regular upper body clothing?: A Little Help from another person to put on and taking off regular lower body clothing?: A Lot 6 Click Score: 15   End of Session Equipment Utilized During Treatment: Oxygen;Other (comment);Gait belt [facility]) Nurse Communication: Mobility status;Other (comment) (Pt reports new sensation of tingling in B LE, continued abdominal discomfort, and feelings of SOB with O2 sat >/93% on 4L throughout session.)  Activity Tolerance: Patient limited by fatigue;Treatment limited secondary to medical complications (Comment) (Pt limited by impaired cardiopulmonary status and signs of anxiety.) Patient left: in bed;with call bell/phone within reach;with bed alarm set  OT Visit Diagnosis: Unsteadiness on feet (R26.81);Muscle weakness (generalized) (M62.81);Other symptoms and signs involving cognitive function;Other (comment) (decreased activity tolerance)                Time: 8848-8791 OT Time Calculation (min): 17 min Charges:  OT General Charges $OT Visit: 1  Visit OT Evaluation $OT Eval Moderate Complexity: 1  Mod  Margarie Rockey HERO., OTR/L, [place] Acute Rehab [phone]   Margarie FORBES Horns 07/04/2024, 12:35 PM

## 2024-07-04 NOTE — Consult Note (Addendum)
 "  Cardiology Consultation   Patient ID: Susan Farley MRN: [id number]; DOB: [birth year]  Admit date: 07/02/2024 Date of Consult: 07/04/2024  PCP:  [facility]   [facility] Providers Cardiologist:  None        Patient Profile: Susan Farley is a 64 y.o. female with a hx of hypertension, hyperlipidemia, T2DM, CVA in 2015, carotid artery disease, lung cancer on immunotherapy, COPD, OSA not on CPAP, and tobacco use who is being seen 07/04/2024 for the evaluation of acute diastolic heart failure at the request of Sabas Brod MD.  History of Present Illness: Susan Farley has no prior cardiac history. She was seen by Dr. Court in 2017 for potential PAD. ABIs at that time were normal and risk modification was recommended. No further follow up was pursued.   Presented to the ED on 1/13 for cold symptoms and DOE.  In the ED: BP: 92/60    SpO2 93% after breathing treatment, placed on 2L [place] ECG: sinus rhythm, TWI in V2-V4, IVCDVR 88 CXR showed cardiomegaly with evidence of pneumonia or asymmetric edema, trace right effusion.  Echo showed LVEF 60-65% with no RWMA. G1 DD. Evidence of RV pressure and volume overload.Mild AS. RAP .   Pertinent lab work: K 3.1 -> 2.8 Cr 1.2 -> 0.87 Hgb ~8  D-dimer negative Pro-BNP 2443 Troponin 36 -> 34 -> 32  She was admitted for acute respiratory failure with hypoxia suspected to be due to PNA. Patient has received antibiotics, steroids and breathing treatments. She is now on 4L of [place] without improvement in her symptoms. Given concern for possible acute diastolic heart failure she was given one dose of IV lasix  40 mg and cardiology was consulted.  Inaccurate I/O charted, UOP yesterday 2.2L. Only one weight charted two days ago.   On interview, patient shared several days ago started noticing feeling really bad particularly short of breath. Reports DOE, orthopnea, and PND. Has noted decreased appetite, cough and  congestion. Denied fever, lightheadedness, syncope, chest pain, peripheral edema.   Shared she did have good UOP yesterday after lasix  dose, though shared overall she doesn't feel that much better. Thinks the breathing treatments have helped more than the lasix .   Past Medical History:  Diagnosis Date   Allergy    Anemia    Anxiety    Asthma    Cancer (HCC)    Cataract    Colitis    Depression    Diabetes mellitus without complication (HCC)    GERD (gastroesophageal reflux disease)    Heart murmur    Hypercholesteremia    Hypertension    Retinitis pigmentosa    Sleep apnea    doesnt wear BIPAP currently   Stroke (HCC) 04/2014   no residual    Past Surgical History:  Procedure Laterality Date   BONE GRAFT HIP ILIAC CREST     CATARACT EXTRACTION     COLONOSCOPY     IUD REMOVAL     LOBECTOMY Left 02/24/2017   Procedure: Left upper lobe LOBECTOMY;  Surgeon: Kerrin Elspeth BROCKS, MD;  Location: [facility] OR;  Service: Thoracic;  Laterality: Left;   NODE DISSECTION Left 02/24/2017   Procedure: NODE DISSECTION;  Surgeon: Kerrin Elspeth BROCKS, MD;  Location: [facility] OR;  Service: Thoracic;  Laterality: Left;   OOPHORECTOMY Right 2004   removal of cyst and ovary   RETINAL CRYOABLATION Bilateral    patient unsure of whether she had this   VIDEO ASSISTED THORACOSCOPY (VATS)/WEDGE RESECTION  Left 02/24/2017   Procedure: Left VIDEO ASSISTED THORACOSCOPY (VATS)/WEDGE RESECTION;  Surgeon: Kerrin Elspeth BROCKS, MD;  Location: [facility] OR;  Service: Thoracic;  Laterality: Left;       Scheduled Meds:  azithromycin   500 mg Oral Daily   budesonide  (PULMICORT ) nebulizer solution  2 mg Nebulization Q12H   enoxaparin  (LOVENOX ) injection  40 mg Subcutaneous Q24H   insulin  aspart  0-15 Units Subcutaneous TID WC   insulin  aspart  0-5 Units Subcutaneous QHS   potassium chloride   40 mEq Oral TID   predniSONE   40 mg Oral Q breakfast   rosuvastatin   40 mg Oral Daily   Continuous Infusions:  cefTRIAXone   (ROCEPHIN )  IV 2 g (07/03/24 1710)   PRN Meds: albuterol   Allergies:   Allergies[1]  Social History:   Social History   Socioeconomic History   Marital status: Single    Spouse name: Not on file   Number of children: 0   Years of education: 14    Highest education level: Not on file  Occupational History   Occupation: Retired     Associate Professor: [employer]  Tobacco Use   Smoking status: Every Day    Current packs/day: 2.00    Average packs/day: 2.0 packs/day for 47.3 years (94.7 ttl pk-yrs)    Types: Cigarettes    Start date: 02/28/1977   Smokeless tobacco: Never   Tobacco comments:    2pk per day   Vaping Use   Vaping status: Former  Substance and Sexual Activity   Alcohol use: Not Currently   Drug use: No   Sexual activity: Not on file  Other Topics Concern   Not on file  Social History Narrative   Patient is single with no children.   Patient is right handed.   Patient has 14 yrs of education.   Patient drinks 2 cups daily.         Epworth Sleepiness Scale Score: 9      --I have HTN   --I have had a stroke   --I seem to be losing my sex drive   --I feel stressed and lack motivation   --I have been told that I stop breathing during sleep   --I wake up to urinate frequently at night   --I wake up with a dry mouth or sore throat   --I feel excessively sleepy and tired during the day   --I have Diabetes   --I have been told that I snore   --I am overweight or am gaining weight   --I wake up gasping for breath during the night   --I have dozed off or fallen asleep at inappropriate times during the day   Social Drivers of Health   Tobacco Use: High Risk (07/02/2024)   Patient History    Smoking Tobacco Use: Every Day    Smokeless Tobacco Use: Never    Passive Exposure: Not on file  Financial Resource Strain: Not on file  Food Insecurity: No Food Insecurity (07/03/2024)   Epic    Worried About Programme Researcher, Broadcasting/film/video in the Last Year: Never true    Ran Out of  Food in the Last Year: Never true  Transportation Needs: No Transportation Needs (07/03/2024)   Epic    Lack of Transportation (Medical): No    Lack of Transportation (Non-Medical): No  Physical Activity: Not on file  Stress: Not on file  Social Connections: Not on file  Intimate Partner Violence: Not At Risk (07/03/2024)   Epic  Fear of Current or Ex-Partner: No    Emotionally Abused: No    Physically Abused: No    Sexually Abused: No  Depression (PHQ2-9): Low Risk (08/15/2023)   Depression (PHQ2-9)    PHQ-2 Score: 3  Alcohol Screen: Not on file  Housing: Low Risk (07/03/2024)   Epic    Unable to Pay for Housing in the Last Year: No    Number of Times Moved in the Last Year: 0    Homeless in the Last Year: No  Utilities: Not At Risk (07/03/2024)   Epic    Threatened with loss of utilities: No  Health Literacy: Not on file    Family History:   Family History  Adopted: Yes  Problem Relation Age of Onset   Diabetes Other        BIOLOGICAL AUNT   Heart disease Other        BIOLOGICAL AUNT   Colon cancer Neg Hx    Colon polyps Neg Hx    Stomach cancer Neg Hx      ROS:  Please see the history of present illness.  All other ROS reviewed and negative.     Physical Exam/Data: Vitals:   07/04/24 0016 07/04/24 0414 07/04/24 0728 07/04/24 0821  BP: 104/61 120/64 106/67   Pulse: 64 99 95 67  Resp: 17 17 19 16   Temp: 97.8 F (36.6 C) (!) 97.5 F (36.4 C) 97.6 F (36.4 C)   TempSrc:      SpO2: 98% 92% 100% 98%  Weight:      Height:        Intake/Output Summary (Last 24 hours) at 07/04/2024 1351 Last data filed at 07/03/2024 2045 Gross per 24 hour  Intake --  Output 1200 ml  Net -1200 ml      07/02/2024    2:43 PM 08/15/2023    9:39 AM 08/16/2022   10:01 AM  Last 3 Weights  Weight (lbs) 226 lb 220 lb 226 lb 6.4 oz  Weight (kg) 102.513 kg 99.791 kg 102.694 kg     Body mass index is 38.79 kg/m.  General:  Well nourished, well developed, in no acute  distress HEENT: normal Neck: no JVD Vascular: No carotid bruits; Distal pulses 2+ bilaterally Cardiac:  normal S1, S2; RRR; no murmur  Lungs:  clear to auscultation bilaterally, no wheezing, rhonchi or rales  Abd: soft, nontender, distended  Ext: no edema Skin: warm and dry  Neuro:  CNs 2-12 intact, no focal abnormalities noted Psych:  Normal affect   EKG:  The EKG was personally reviewed and demonstrates:  See Hpi for initial, repeat showed ECG sinus rhythm with deepen in TWI V2 and V3, IVCD VR 83 Telemetry:  Telemetry was personally reviewed and demonstrates:  Not on telemetry   Relevant CV Studies:  OSH echocardiogram 06/2022 LVEF > 55% with mild LVH and normal diastolic function. Baseline global longitudinal strain -18.6%. Borderline RV enlargement with normal function. No valvular disease. Trivial pericardial effusion. [See report for full details]  Laboratory Data: High Sensitivity Troponin:   Recent Labs  Lab 07/02/24 1528 07/02/24 1845 07/03/24 2210  TRNPT 36* 34* 32*      Chemistry Recent Labs  Lab 07/02/24 1528 07/02/24 1539 07/03/24 0448 07/04/24 0229  NA 142 142 142 145  K 3.1* 3.1* 3.7 2.8*  CL 102 100 103 103  CO2 31  --  30 32  GLUCOSE 72 72 110* 103*  BUN 15 15 16 15   CREATININE 1.05* 1.20*  0.96 0.87  CALCIUM  9.4  --  9.2 9.6  GFRNONAA 59*  --  >60 >60  ANIONGAP 9  --  9 10    Recent Labs  Lab 07/02/24 1528 07/04/24 0229  PROT 6.5 6.5  ALBUMIN 3.6 3.6  AST 29 20  ALT 24 19  ALKPHOS 87 79  BILITOT 0.2 0.3   Hematology Recent Labs  Lab 07/02/24 1528 07/02/24 1539 07/03/24 0448 07/04/24 0229  WBC 5.4  --  6.4 8.1  RBC 3.11*  --  2.92* 3.17*  HGB 8.1* 9.2* 7.6* 8.2*  HCT 29.7* 27.0* 27.7* 29.6*  MCV 95.5  --  94.9 93.4  MCH 26.0  --  26.0 25.9*  MCHC 27.3*  --  27.4* 27.7*  RDW 22.0*  --  21.8* 21.7*  PLT 270  --  298 283   BNP Recent Labs  Lab 07/02/24 1528  PROBNP 2,443.0*    DDimer  Recent Labs  Lab 07/02/24 1528   DDIMER 0.48    Radiology/Studies:  ECHOCARDIOGRAM COMPLETE Result Date: 07/03/2024    ECHOCARDIOGRAM REPORT   Patient Name:   Susan Farley Date of Exam: 07/03/2024 Medical Rec #:  [id number]        Height:       64.0 in Accession #:    [id number]       Weight:       226.0 lb Date of Birth:  [birth year]         BSA:          2.061 m Patient Age:    64 years         BP:           114/77 mmHg Patient Gender: F                HR:           96 bpm. Exam Location:  Inpatient Procedure: 2D Echo, Cardiac Doppler and Color Doppler (Both Spectral and Color            Flow Doppler were utilized during procedure). Indications:    CHF I50.31  History:        Patient has prior history of Echocardiogram examinations, most                 recent 07/13/2022. Risk Factors:Hypertension.  Sonographer:    Nathanel Devonshire Referring Phys: MILBERT SABAS RAMAN LAMA IMPRESSIONS  1. Mildly elevated mean gradient (12 mmHg) across aortic valve suggests mild AS; visually valve opens well.  2. Left ventricular ejection fraction, by estimation, is 60 to 65%. The left ventricle has normal function. The left ventricle has no regional wall motion abnormalities. Left ventricular diastolic parameters are consistent with Grade I diastolic dysfunction (impaired relaxation). There is the interventricular septum is flattened in systole and diastole, consistent with right ventricular pressure and volume overload.  3. Right ventricular systolic function is normal. The right ventricular size is normal. Tricuspid regurgitation signal is inadequate for assessing PA pressure.  4. The mitral valve is normal in structure. No evidence of mitral valve regurgitation. No evidence of mitral stenosis.  5. The aortic valve has an indeterminant number of cusps. Aortic valve regurgitation is not visualized. Mild aortic valve stenosis.  6. The inferior vena cava is normal in size with greater than 50% respiratory variability, suggesting right atrial pressure of 3 mmHg.  Comparison(s): No prior Echocardiogram. FINDINGS  Left Ventricle: Left ventricular ejection fraction, by estimation, is 60 to 65%. The left  ventricle has normal function. The left ventricle has no regional wall motion abnormalities. The left ventricular internal cavity size was normal in size. There is  no left ventricular hypertrophy. The interventricular septum is flattened in systole and diastole, consistent with right ventricular pressure and volume overload. Left ventricular diastolic parameters are consistent with Grade I diastolic dysfunction (impaired relaxation). Right Ventricle: The right ventricular size is normal. Right ventricular systolic function is normal. Tricuspid regurgitation signal is inadequate for assessing PA pressure. The tricuspid regurgitant velocity is 2.37 m/s, and with an assumed right atrial  pressure of 3 mmHg, the estimated right ventricular systolic pressure is 25.5 mmHg. Left Atrium: Left atrial size was normal in size. Right Atrium: Right atrial size was normal in size. Pericardium: There is no evidence of pericardial effusion. Mitral Valve: The mitral valve is normal in structure. No evidence of mitral valve regurgitation. No evidence of mitral valve stenosis. Tricuspid Valve: The tricuspid valve is normal in structure. Tricuspid valve regurgitation is trivial. No evidence of tricuspid stenosis. Aortic Valve: The aortic valve has an indeterminant number of cusps. Aortic valve regurgitation is not visualized. Mild aortic stenosis is present. Aortic valve mean gradient measures 12.0 mmHg. Aortic valve peak gradient measures 23.2 mmHg. Aortic valve  area, by VTI measures 2.23 cm. Pulmonic Valve: The pulmonic valve was normal in structure. Pulmonic valve regurgitation is not visualized. No evidence of pulmonic stenosis. Aorta: The aortic root is normal in size and structure. Venous: The inferior vena cava is normal in size with greater than 50% respiratory variability, suggesting  right atrial pressure of 3 mmHg. IAS/Shunts: No atrial level shunt detected by color flow Doppler. Additional Comments: Mildly elevated mean gradient (12 mmHg) across aortic valve suggests mild AS; visually valve opens well.  LEFT VENTRICLE PLAX 2D LVIDd:         4.60 cm     Diastology LVIDs:         3.00 cm     LV e' medial:    7.18 cm/s LV PW:         0.90 cm     LV E/e' medial:  14.1 LV IVS:        0.90 cm     LV e' lateral:   10.30 cm/s LVOT diam:     2.00 cm     LV E/e' lateral: 9.8 LV SV:         99 LV SV Index:   48 LVOT Area:     3.14 cm LV IVRT:       92 msec  LV Volumes (MOD) LV vol d, MOD A2C: 98.6 ml LV vol d, MOD A4C: 59.2 ml LV vol s, MOD A2C: 28.9 ml LV vol s, MOD A4C: 23.1 ml LV SV MOD A2C:     69.7 ml LV SV MOD A4C:     59.2 ml LV SV MOD BP:      54.2 ml RIGHT VENTRICLE          IVC RV Basal diam:  3.20 cm  IVC diam: 2.00 cm TAPSE (M-mode): 1.6 cm                          PULMONARY VEINS                          Diastolic Velocity: 84.60 cm/s  S/D Velocity:       0.80                          Systolic Velocity:  69.40 cm/s LEFT ATRIUM             Index        RIGHT ATRIUM           Index LA diam:        3.10 cm 1.50 cm/m   RA Area:     16.40 cm LA Vol (A2C):   41.2 ml 19.99 ml/m  RA Volume:   39.80 ml  19.31 ml/m LA Vol (A4C):   32.0 ml 15.53 ml/m LA Biplane Vol: 36.9 ml 17.91 ml/m  AORTIC VALVE                     PULMONIC VALVE AV Area (Vmax):    1.93 cm      PV Vmax:       1.58 m/s AV Area (Vmean):   2.18 cm      PV Peak grad:  10.0 mmHg AV Area (VTI):     2.23 cm AV Vmax:           241.00 cm/s AV Vmean:          163.000 cm/s AV VTI:            0.445 m AV Peak Grad:      23.2 mmHg AV Mean Grad:      12.0 mmHg LVOT Vmax:         148.00 cm/s LVOT Vmean:        113.000 cm/s LVOT VTI:          0.316 m LVOT/AV VTI ratio: 0.71  AORTA Ao Root diam: 2.70 cm Ao Asc diam:  2.50 cm MITRAL VALVE                TRICUSPID VALVE MV Area (PHT): 3.60 cm     TR Peak grad:   22.5  mmHg MV Decel Time: 211 msec     TR Vmax:        237.00 cm/s MV E velocity: 101.00 cm/s MV A velocity: 136.00 cm/s  SHUNTS MV E/A ratio:  0.74         Systemic VTI:  0.32 m                             Systemic Diam: 2.00 cm Redell Shallow MD Electronically signed by Redell Shallow MD Signature Date/Time: 07/03/2024/3:36:24 PM    Final    DG Chest 2 View Result Date: 07/02/2024 CLINICAL DATA:  Shortness of breath, cough EXAM: CHEST - 2 VIEW COMPARISON:  10/26/2019 FINDINGS: Frontal and lateral views of the chest demonstrate an enlarged cardiac silhouette. There is patchy right perihilar airspace disease and trace right pleural effusion. Left chest is clear. No pneumothorax. IMPRESSION: 1. Patchy right perihilar airspace disease and trace right effusion, favor bronchopneumonia over asymmetric edema. 2. Enlarged cardiac silhouette. Electronically Signed   By: Ozell Daring M.D.   On: 07/02/2024 16:02     Assessment and Plan:  Acute respiratory failure with hypoxia RV Dysfunction ? Reported shortness of breath on exertion, orthopnea, and PND. Also reported improvement in symptoms with breathing treatments over dose of IV lasix .  On exam, does not appear to be volume up though still requiring oxygen.  Requested updated weight today.   Her shortness of breath and hypoxia is most likely multifactorial with her current COPD exacerbation, untreated OSA, ongoing tobacco use and acute on chronic anemia. While she may be volume up as seen on imaging, suspect the volume overload seen on echo is more likely due to RV strain from her pulmonary conditions of COPD with ongoing tobacco use and untreated OSA. While she does have G1 DD, unlikely the main contributor to her shortness of breath. ECG findings and echo findings concerning for possible PE, though D-dimer this admission was negative.  Could consider RHC to for further evaluation  Patient confirmed not taking pioglitazone, would recommend continuing  cessation for now Would give an additional dose of IV lasix  40 mg, though would recheck potassium prior given degree of hypokalemia.   Acute on chronic anemia OSH records show hgb baseline ~10.  I wonder if this is contributing to some of her shortness of breath. Per primary  Hypertension BP: 106/67 BP has been low-soft this admission. Home medication of hydrochlorothiazide  has been held.   Hyperlipidemia Lipid panel in the am Continue crestor  40 mg  Carotid artery diease Recommend follow up outpatient imaging to re-assess as it has been since 2018.  Restart ASA 81 mg  Cholesterol management as above Recommend tobacco cessation   Elevated Troponin Flat and minimally elevated, denied chest pain. Most likely demand ischemia with hypoxia and degree of anemia   Per primary COPD exacerbation T2DM Tobacco use OSA not on CPAP Lung cancer on immunotherapy Anemia Hx of CVA  Risk Assessment/Risk Scores:          For questions or updates, please contact [facility] Please consult [email or web address] for contact info under      Signed, Leontine LOISE Salen, PA-C  07/04/2024 1:51 PM  I have seen and examined the patient along with Leontine LOISE Salen, PA-C .  I have reviewed the chart, notes and new data.  I agree with PA/NP's note.  Key new complaints: She feels very weak.  She has had nasal congestion and a cough for a week.  She has sharp twinges of chest pain associated with breathing.  She has a chronic cough.  She has known sleep apnea but has never been able to tolerate CPAP.  She is on treatment for small cell lung cancer with Durvalumab  Key examination changes: Borderline hypotension.  Oxygen saturation is normal on 4 L by nasal cannula.  She does not appear tachypneic.  She does not appear to have jugular venous distention or peripheral edema.  Diminished breath sounds throughout.  Regular rate and rhythm, with a early peaking aortic ejection murmur. Key new findings /  data: Hemoglobin was 10.1 just a few weeks ago on December 18 and is now down to 7.6-8.2 with microcytic indices and labs in the past showed iron  deficiency (not checked during this admission).  Potassium is severely low at 2.8.  D-dimer was not elevated.  BNP is indeed elevated.  ECG shows sinus rhythm with premature atrial contractions, pre-existing right bundle branch block and left axis deviation, new T wave inversion leads V1-V3.  Echocardiogram shows primarily abnormalities of the right ventricle which appears dilated, although the estimated PA pressure was not particularly high based on the TR jet, there is septal flattening suggesting RV pressure overload.  PLAN: Cardiac findings are primarily those of cor pulmonale/right heart overload.  Very mild aortic stenosis is not hemodynamically important at this point.  She appears to have  mild left ventricular diastolic dysfunction and there is no convincing evidence of elevated left atrial pressures that could cause pulmonary edema or hypoxia.    She was mildly hypotensive, blood pressure is a little better now.  For a patient with chronic lung abnormalities she is severely anemic with a hemoglobin of 8 and this is possibly a big part of her weakness.  She is also probably weak because of severe hypokalemia.  Would hold off administration of any additional diuretics until her potassium level has been normalized.  We can try diuresing a little more aggressively tomorrow if her blood pressure and potassium level allow.  Need to consider possibility of PD-L1 blocker related pneumonitis as a reason for her deterioration.  Suggest pulmonary consultation.  Jerel Balding, MD, [facility] [phone] 07/04/2024, 6:15 PM      [1]  Allergies Allergen Reactions   Shellfish Allergy Anaphylaxis   Diaper Rash Products Other (See Comments)    Unknown    Hydrochlorothiazide  Other (See Comments)    Unknown    Iodine Other (See Comments)     Unknown    Lansoprazole Other (See Comments)    Unknown    Metformin  Diarrhea and Nausea Only     GI Upset (intolerance)   Omeprazole Other (See Comments)    Unknown    Pravastatin Other (See Comments)    Unknown    Simvastatin  Other (See Comments)    Myalgias (intolerance)   Tomato (Diagnostic) Other (See Comments)    Unknown    Bupropion Hives and Rash   Empagliflozin Other (See Comments)     Increased frequency of urination   Pregabalin Other (See Comments)    Muscle pain   Semaglutide Nausea And Vomiting   "

## 2024-07-04 NOTE — Progress Notes (Addendum)
 Transition of Care [facility]) - Inpatient Brief Assessment   Patient Details  Name: Susan Farley MRN: [id number] Date of Birth: December 27, 1960  Transition of Care [facility]) CM/SW Contact:    Rosaline JONELLE Joe, RN Phone Number: 07/04/2024, 1:51 PM   Clinical Narrative: CM met with the patient at the bedside to discuss IP Care management needs.  The patient states that she lives alone and plans to return home when stable.  Patient states that she has inhalers at home but is otherwise independent.  Patient states that she drove herself to the Urgent Care at [facility] and was sent to the ER when she admitted for SOB.  Patient states that she is active with the [facility] but did not call to notify them that she admitted to the hospital.  I called [facility] and notified them of her admission to the hospital.  I spoke with Remi, CM at the [facility] and they will notify [facility] of her admission.  Remi, CM at the [facility] is aware that patient will need RW and home health services through the [facility] when she returns home.  [facility] and [facility] services.  Patient was on RA at home and is currently on oxygen in the hospital receiving diuretics at this time.  [facility] - patient updated.  Patient was offered choice regarding home health and chose [facility] from the accepting [facility] agencies - request for services faxed to the [facility].  [facility] is aware.  PCP at the [facility] is Dr. Corrie Low.  CM will continue to follow the patient for discharge needs to return home.  DME for RW and [facility] services documents were faxed to the [facility] for delivery to the home - Fax # [phone].   Transition of Care Asessment: Insurance and Status: (P) Insurance coverage has been reviewed Patient has primary care physician: (P) Yes Home environment has been reviewed: (P) from home alone Prior level of function:: (P) self Prior/Current Home Services: (P) No  current home services Social Drivers of Health Review: (P) SDOH reviewed needs interventions Readmission risk has been reviewed: (P) Yes Transition of care needs: (P) transition of care needs identified, TOC will continue to follow

## 2024-07-04 NOTE — Progress Notes (Signed)
 Initial Nutrition Assessment  DOCUMENTATION CODES:   Not applicable  INTERVENTION:   Encourage po intake  Room service with assist  If po intake decreases, recommend liberalizing diet to promote po intake  Ensure Max po BID, each supplement provides 150 kcal and 30 grams of protein.  MVI with minerals daily   NUTRITION DIAGNOSIS:   Increased nutrient needs related to acute illness as evidenced by estimated needs.  GOAL:   Patient will meet greater than or equal to 90% of their needs  MONITOR:   PO intake, Supplement acceptance, Labs, Weight trends, I & O's, Skin  REASON FOR ASSESSMENT:   Consult Assessment of nutrition requirement/status  ASSESSMENT:   64 y.o. female with hx of of lung cancer currently on immunotherapy, asthma/COPD with chronic tobacco abuse, HTN, HLD, non-insulin -dependent T2DM, sleep apnea not compliant with CPAP, prior CVA.  Found to be in acute respiratory failure, asthma vs PNA, and acute CHF (EF 60-65%).  Pt resting in bed, not keen on providing much nutritional history, halfway through interview pt called her family on the phone. Limited history provided.   Pt was living at home by herself and able to do her ADL's independently. Denies recent weight loss or loss of appetite. UBW 220-230 lbs. Reviewed weight history and pt has been weight stable in the last year. Typically eats 2 meals per day.   Had 100% of her breakfast this morning, did not have lunch because pt cannot see well even with glasses. Took pt's dinner order and will place room service with assistance. Has an appetite, denies N/V.   On exam pt fluid overloaded, being diuresed. Exam limited however do not suspect malnutrition at this time. Will add protein shakes for inpatient stay.   Admit weight: 102.5 kg Current weight: 102.5 kg   Average Meal Intake: No meals recorded  Nutritionally Relevant Medications: Scheduled Meds:  insulin  aspart  0-15 Units Subcutaneous TID WC    insulin  aspart  0-5 Units Subcutaneous QHS   potassium chloride   40 mEq Oral TID   Continuous Infusions:  cefTRIAXone  (ROCEPHIN )  IV 2 g (07/03/24 1710)   Labs Reviewed: Potassium 2.8 CBG ranges from 99-138 mg/dL over the last 24 hours HgbA1c 5.4  NUTRITION - FOCUSED PHYSICAL EXAM:  Flowsheet Row Most Recent Value  Orbital Region No depletion  Upper Arm Region No depletion  Thoracic and Lumbar Region No depletion  Buccal Region No depletion  Temple Region No depletion  Clavicle Bone Region No depletion  Clavicle and Acromion Bone Region No depletion  Scapular Bone Region No depletion  Dorsal Hand Unable to assess  [Fluid]  Patellar Region Unable to assess  [Fluid]  Anterior Thigh Region Unable to assess  [Fluid]  Posterior Calf Region Unable to assess  [Fluid]  Edema (RD Assessment) Severe  [Fluid overloaded]  Hair Reviewed  Eyes Reviewed  Mouth Reviewed  Skin Reviewed  Nails Reviewed    Diet Order:   Diet Order             Diet Carb Modified Room service appropriate? Yes  Diet effective now                   EDUCATION NEEDS:   Not appropriate for education at this time  Skin:  Skin Assessment: Reviewed RN Assessment  Last BM:  1/14  Height:   Ht Readings from Last 1 Encounters:  07/02/24 5' 4 (1.626 m)    Weight:   Wt Readings from Last 1 Encounters:  07/02/24 102.5 kg    Ideal Body Weight:  54.5 kg  BMI:  Body mass index is 38.79 kg/m.  Estimated Nutritional Needs:   Kcal:  1700-1900 kcal  Protein:  100-120 gm  Fluid:  >1.7L/day   Olivia Kenning, RD Registered Dietitian  See Amion for more information

## 2024-07-04 NOTE — Evaluation (Signed)
 Physical Therapy Evaluation Patient Details Name: Susan Farley MRN: [id number] DOB: 03-12-1961 Today's Date: 07/04/2024  History of Present Illness  Pt is 64 year old presented to [facility] on  07/02/24 for SOB. Pt with acute respiratory failure with hypoxia due to copd vs asthma vs PNA. PMH - DM II, HTN, CVA without residual, asthma, lung CA, PE depression, breast CA, neuropathy.  Clinical Impression  Patient presents with decreased mobility due to generalized weakness, decreased activity tolerance with decreased cardiorespiratory endurance.  Previously independent living alone completing IADL's.  Currently CGA for up to [facility] and limited tolerance feeling SOB and weak.  Feel she will benefit from skilled PT in the acute setting and anticipate improvement to allow d/c home with HHPT.         If plan is discharge home, recommend the following: A little help with walking and/or transfers;A little help with bathing/dressing/bathroom   Can travel by private vehicle        Equipment Recommendations Rolling walker (2 wheels)  Recommendations for Other Services       Functional Status Assessment Patient has had a recent decline in their functional status and demonstrates the ability to make significant improvements in function in a reasonable and predictable amount of time.     Precautions / Restrictions Precautions Precautions: Fall Precaution/Restrictions Comments: watch SpO2      Mobility  Bed Mobility Overal bed mobility: Needs Assistance Bed Mobility: Supine to Sit, Sit to Supine     Supine to sit: HOB elevated, Contact guard Sit to supine: Supervision   General bed mobility comments: assist for trunk support to sit, to supine with S and cues, requesting HOB up    Transfers Overall transfer level: Needs assistance Equipment used: None Transfers: Sit to/from Stand, Bed to chair/wheelchair/BSC Sit to Stand: Contact guard assist   Step pivot transfers: Contact guard assist        General transfer comment: up to stand with A for balance, stepping to [facility] cues for hand on opposite rail and CGA for stepping to [facility].  Up to stand for hygiene S to CGA and stepping back to bed with A for balance    Ambulation/Gait               General Gait Details: deferred due to pt c/o not feeling well  Stairs            Wheelchair Mobility     Tilt Bed    Modified Rankin (Stroke Patients Only)       Balance Overall balance assessment: Needs assistance   Sitting balance-Leahy Scale: Good     Standing balance support: No upper extremity supported Standing balance-Leahy Scale: Fair Standing balance comment: standing for toilet hygiene with S able to maintain while PT returning to bathroom for toilet paper                             Pertinent Vitals/Pain Pain Assessment Pain Assessment: Faces Faces Pain Scale: Hurts little more Pain Location: abdomen Pain Descriptors / Indicators: Discomfort Pain Intervention(s): Monitored during session    Home Living Family/patient expects to be discharged to:: Private residence Living Arrangements: Alone   Type of Home: House Home Access: Level entry       Home Layout: One level Home Equipment: Cane - single point;Hand held shower head      Prior Function Prior Level of Function : Independent/Modified Independent  Mobility Comments: denies falls, but some difficulty getting around recently       Extremity/Trunk Assessment   Upper Extremity Assessment Upper Extremity Assessment: Generalized weakness    Lower Extremity Assessment Lower Extremity Assessment: Generalized weakness       Communication   Communication Communication: No apparent difficulties    Cognition Arousal: Alert Behavior During Therapy: WFL for tasks assessed/performed   PT - Cognitive impairments: No apparent impairments                                 Cueing Cueing  Techniques: Verbal cues     General Comments General comments (skin integrity, edema, etc.): on 3L O2 and SpO2 86% after mobility, increased to 4L and cues for pursed lip breathing up to 94%    Exercises     Assessment/Plan    PT Assessment Patient needs continued PT services  PT Problem List Decreased strength;Decreased activity tolerance;Decreased balance;Decreased mobility;Decreased knowledge of use of DME;Cardiopulmonary status limiting activity       PT Treatment Interventions DME instruction;Gait training;Functional mobility training;Therapeutic activities;Therapeutic exercise;Balance training;Patient/family education    PT Goals (Current goals can be found in the Care Plan section)  Acute Rehab PT Goals Patient Stated Goal: stay till feeling better PT Goal Formulation: With patient Time For Goal Achievement: 07/18/24 Potential to Achieve Goals: Good    Frequency Min 2X/week     Co-evaluation               AM-PAC PT 6 Clicks Mobility  Outcome Measure Help needed turning from your back to your side while in a flat bed without using bedrails?: A Little Help needed moving from lying on your back to sitting on the side of a flat bed without using bedrails?: A Little Help needed moving to and from a bed to a chair (including a wheelchair)?: A Little Help needed standing up from a chair using your arms (e.g., wheelchair or bedside chair)?: A Little Help needed to walk in hospital room?: Total Help needed climbing 3-5 steps with a railing? : Total 6 Click Score: 14    End of Session Equipment Utilized During Treatment: Oxygen Activity Tolerance: Patient limited by fatigue Patient left: in bed;with call bell/phone within reach;with bed alarm set   PT Visit Diagnosis: Muscle weakness (generalized) (M62.81);Other abnormalities of gait and mobility (R26.89)    Time: 8984-8965 PT Time Calculation (min) (ACUTE ONLY): 19 min   Charges:   PT Evaluation $PT Eval  Moderate Complexity: 1 Mod   PT General Charges $$ ACUTE PT VISIT: 1 Visit         Micheline Portal, PT Acute Rehabilitation Services [phone] 07/04/2024   Montie Portal 07/04/2024, 12:01 PM

## 2024-07-05 DIAGNOSIS — J9601 Acute respiratory failure with hypoxia: Secondary | ICD-10-CM | POA: Diagnosis not present

## 2024-07-05 LAB — RESPIRATORY PANEL BY PCR

## 2024-07-05 LAB — GLUCOSE, CAPILLARY
Glucose-Capillary: 100 mg/dL — ABNORMAL HIGH (ref 70–99)
Glucose-Capillary: 136 mg/dL — ABNORMAL HIGH (ref 70–99)
Glucose-Capillary: 158 mg/dL — ABNORMAL HIGH (ref 70–99)
Glucose-Capillary: 77 mg/dL (ref 70–99)

## 2024-07-05 MED ORDER — FUROSEMIDE 10 MG/ML IJ SOLN
40.0000 mg | Freq: Once | INTRAMUSCULAR | Status: AC
  Administered 2024-07-05: 40 mg via INTRAVENOUS
  Filled 2024-07-05: qty 4

## 2024-07-05 MED ORDER — MELATONIN 3 MG PO TABS
3.0000 mg | ORAL_TABLET | Freq: Every evening | ORAL | Status: DC | PRN
  Administered 2024-07-05: 3 mg via ORAL
  Filled 2024-07-05: qty 1

## 2024-07-05 MED ORDER — DIPHENHYDRAMINE HCL 25 MG PO CAPS
25.0000 mg | ORAL_CAPSULE | Freq: Once | ORAL | Status: AC
  Administered 2024-07-05: 25 mg via ORAL
  Filled 2024-07-05: qty 1

## 2024-07-05 NOTE — Progress Notes (Addendum)
 "  Rounding Note   Patient Name: Susan Farley Date of Encounter: 07/05/2024  [facility] Cardiologist: None   Subjective  Breathing slightly better than yesterday. O2 requirement came down from 4L to  3L. Significant cough during exam.    Scheduled Meds:  azithromycin   500 mg Oral Daily   budesonide  (PULMICORT ) nebulizer solution  2 mg Nebulization Q12H   enoxaparin  (LOVENOX ) injection  40 mg Subcutaneous Q24H   insulin  aspart  0-15 Units Subcutaneous TID WC   insulin  aspart  0-5 Units Subcutaneous QHS   multivitamin with minerals  1 tablet Oral Daily   predniSONE   40 mg Oral Q breakfast   Ensure Max Protein  11 oz Oral Daily   rosuvastatin   40 mg Oral Daily   Continuous Infusions:  cefTRIAXone  (ROCEPHIN )  IV 2 g (07/04/24 1748)   PRN Meds: albuterol , ALPRAZolam    Vital Signs  Vitals:   07/04/24 1941 07/04/24 2213 07/05/24 0300 07/05/24 0852  BP:  108/88 (!) (P) 144/97 131/87  Pulse:  (!) 106 (!) (P) 109 (!) 102  Resp:      Temp:  (!) 97.5 F (36.4 C) (P) 97.6 F (36.4 C) 98 F (36.7 C)  TempSrc:      SpO2: 98% 100% 98% 100%  Weight:      Height:        Intake/Output Summary (Last 24 hours) at 07/05/2024 1047 Last data filed at 07/05/2024 0400 Gross per 24 hour  Intake 286.47 ml  Output --  Net 286.47 ml      07/04/2024    6:00 PM 07/02/2024    2:43 PM 08/15/2023    9:39 AM  Last 3 Weights  Weight (lbs) 215 lb 2.7 oz 226 lb 220 lb  Weight (kg) 97.6 kg 102.513 kg 99.791 kg      Telemetry Not on telemetry - Personally Reviewed  ECG   Sinus rhythm with LVH, TWI in V1-V3, PACs - Personally Reviewed  Physical Exam  GEN: No acute distress.  Frequent cough Neck: No JVD Cardiac: RRR, no murmurs, rubs, or gallops.  Respiratory: Clear to auscultation bilaterally. GI: Soft, nontender, non-distended  MS: No edema; No deformity. Neuro:  Nonfocal  Psych: Normal affect   Labs High Sensitivity Troponin:  No results for input(s): TROPONINIHS  in the last 720 hours.   Chemistry Recent Labs  Lab 07/02/24 1528 07/02/24 1539 07/03/24 0448 07/04/24 0229 07/04/24 1900  NA 142 142 142 145  --   K 3.1* 3.1* 3.7 2.8* 4.1  CL 102 100 103 103  --   CO2 31  --  30 32  --   GLUCOSE 72 72 110* 103*  --   BUN 15 15 16 15   --   CREATININE 1.05* 1.20* 0.96 0.87  --   CALCIUM  9.4  --  9.2 9.6  --   PROT 6.5  --   --  6.5  --   ALBUMIN 3.6  --   --  3.6  --   AST 29  --   --  20  --   ALT 24  --   --  19  --   ALKPHOS 87  --   --  79  --   BILITOT 0.2  --   --  0.3  --   GFRNONAA 59*  --  >60 >60  --   ANIONGAP 9  --  9 10  --     Lipids No results for input(s): CHOL,  TRIG, HDL, LABVLDL, LDLCALC, CHOLHDL in the last 168 hours.  Hematology Recent Labs  Lab 07/02/24 1528 07/02/24 1539 07/03/24 0448 07/04/24 0229  WBC 5.4  --  6.4 8.1  RBC 3.11*  --  2.92* 3.17*  HGB 8.1* 9.2* 7.6* 8.2*  HCT 29.7* 27.0* 27.7* 29.6*  MCV 95.5  --  94.9 93.4  MCH 26.0  --  26.0 25.9*  MCHC 27.3*  --  27.4* 27.7*  RDW 22.0*  --  21.8* 21.7*  PLT 270  --  298 283   Thyroid  No results for input(s): TSH, FREET4 in the last 168 hours.  BNP Recent Labs  Lab 07/02/24 1528  PROBNP 2,443.0*    DDimer  Recent Labs  Lab 07/02/24 1528  DDIMER 0.48     Radiology  ECHOCARDIOGRAM COMPLETE Result Date: 07/03/2024    ECHOCARDIOGRAM REPORT   Patient Name:   Susan Farley Date of Exam: 07/03/2024 Medical Rec #:  [id number]        Height:       64.0 in Accession #:    [id number]       Weight:       226.0 lb Date of Birth:  December 29, 1960         BSA:          2.061 m Patient Age:    64 years         BP:           114/77 mmHg Patient Gender: F                HR:           96 bpm. Exam Location:  Inpatient Procedure: 2D Echo, Cardiac Doppler and Color Doppler (Both Spectral and Color            Flow Doppler were utilized during procedure). Indications:    CHF I50.31  History:        Patient has prior history of Echocardiogram examinations, most                  recent 07/13/2022. Risk Factors:Hypertension.  Sonographer:    Nathanel Devonshire Referring Phys: MILBERT SABAS RAMAN LAMA IMPRESSIONS  1. Mildly elevated mean gradient (12 mmHg) across aortic valve suggests mild AS; visually valve opens well.  2. Left ventricular ejection fraction, by estimation, is 60 to 65%. The left ventricle has normal function. The left ventricle has no regional wall motion abnormalities. Left ventricular diastolic parameters are consistent with Grade I diastolic dysfunction (impaired relaxation). There is the interventricular septum is flattened in systole and diastole, consistent with right ventricular pressure and volume overload.  3. Right ventricular systolic function is normal. The right ventricular size is normal. Tricuspid regurgitation signal is inadequate for assessing PA pressure.  4. The mitral valve is normal in structure. No evidence of mitral valve regurgitation. No evidence of mitral stenosis.  5. The aortic valve has an indeterminant number of cusps. Aortic valve regurgitation is not visualized. Mild aortic valve stenosis.  6. The inferior vena cava is normal in size with greater than 50% respiratory variability, suggesting right atrial pressure of 3 mmHg. Comparison(s): No prior Echocardiogram. FINDINGS  Left Ventricle: Left ventricular ejection fraction, by estimation, is 60 to 65%. The left ventricle has normal function. The left ventricle has no regional wall motion abnormalities. The left ventricular internal cavity size was normal in size. There is  no left ventricular hypertrophy. The interventricular septum is flattened in systole and diastole,  consistent with right ventricular pressure and volume overload. Left ventricular diastolic parameters are consistent with Grade I diastolic dysfunction (impaired relaxation). Right Ventricle: The right ventricular size is normal. Right ventricular systolic function is normal. Tricuspid regurgitation signal is inadequate for  assessing PA pressure. The tricuspid regurgitant velocity is 2.37 m/s, and with an assumed right atrial  pressure of 3 mmHg, the estimated right ventricular systolic pressure is 25.5 mmHg. Left Atrium: Left atrial size was normal in size. Right Atrium: Right atrial size was normal in size. Pericardium: There is no evidence of pericardial effusion. Mitral Valve: The mitral valve is normal in structure. No evidence of mitral valve regurgitation. No evidence of mitral valve stenosis. Tricuspid Valve: The tricuspid valve is normal in structure. Tricuspid valve regurgitation is trivial. No evidence of tricuspid stenosis. Aortic Valve: The aortic valve has an indeterminant number of cusps. Aortic valve regurgitation is not visualized. Mild aortic stenosis is present. Aortic valve mean gradient measures 12.0 mmHg. Aortic valve peak gradient measures 23.2 mmHg. Aortic valve  area, by VTI measures 2.23 cm. Pulmonic Valve: The pulmonic valve was normal in structure. Pulmonic valve regurgitation is not visualized. No evidence of pulmonic stenosis. Aorta: The aortic root is normal in size and structure. Venous: The inferior vena cava is normal in size with greater than 50% respiratory variability, suggesting right atrial pressure of 3 mmHg. IAS/Shunts: No atrial level shunt detected by color flow Doppler. Additional Comments: Mildly elevated mean gradient (12 mmHg) across aortic valve suggests mild AS; visually valve opens well.  LEFT VENTRICLE PLAX 2D LVIDd:         4.60 cm     Diastology LVIDs:         3.00 cm     LV e' medial:    7.18 cm/s LV PW:         0.90 cm     LV E/e' medial:  14.1 LV IVS:        0.90 cm     LV e' lateral:   10.30 cm/s LVOT diam:     2.00 cm     LV E/e' lateral: 9.8 LV SV:         99 LV SV Index:   48 LVOT Area:     3.14 cm LV IVRT:       92 msec  LV Volumes (MOD) LV vol d, MOD A2C: 98.6 ml LV vol d, MOD A4C: 59.2 ml LV vol s, MOD A2C: 28.9 ml LV vol s, MOD A4C: 23.1 ml LV SV MOD A2C:     69.7 ml LV  SV MOD A4C:     59.2 ml LV SV MOD BP:      54.2 ml RIGHT VENTRICLE          IVC RV Basal diam:  3.20 cm  IVC diam: 2.00 cm TAPSE (M-mode): 1.6 cm                          PULMONARY VEINS                          Diastolic Velocity: 84.60 cm/s                          S/D Velocity:       0.80  Systolic Velocity:  69.40 cm/s LEFT ATRIUM             Index        RIGHT ATRIUM           Index LA diam:        3.10 cm 1.50 cm/m   RA Area:     16.40 cm LA Vol (A2C):   41.2 ml 19.99 ml/m  RA Volume:   39.80 ml  19.31 ml/m LA Vol (A4C):   32.0 ml 15.53 ml/m LA Biplane Vol: 36.9 ml 17.91 ml/m  AORTIC VALVE                     PULMONIC VALVE AV Area (Vmax):    1.93 cm      PV Vmax:       1.58 m/s AV Area (Vmean):   2.18 cm      PV Peak grad:  10.0 mmHg AV Area (VTI):     2.23 cm AV Vmax:           241.00 cm/s AV Vmean:          163.000 cm/s AV VTI:            0.445 m AV Peak Grad:      23.2 mmHg AV Mean Grad:      12.0 mmHg LVOT Vmax:         148.00 cm/s LVOT Vmean:        113.000 cm/s LVOT VTI:          0.316 m LVOT/AV VTI ratio: 0.71  AORTA Ao Root diam: 2.70 cm Ao Asc diam:  2.50 cm MITRAL VALVE                TRICUSPID VALVE MV Area (PHT): 3.60 cm     TR Peak grad:   22.5 mmHg MV Decel Time: 211 msec     TR Vmax:        237.00 cm/s MV E velocity: 101.00 cm/s MV A velocity: 136.00 cm/s  SHUNTS MV E/A ratio:  0.74         Systemic VTI:  0.32 m                             Systemic Diam: 2.00 cm Redell Shallow MD Electronically signed by Redell Shallow MD Signature Date/Time: 07/03/2024/3:36:24 PM    Final     Cardiac Studies  Echo 07/03/2024 1. Mildly elevated mean gradient (12 mmHg) across aortic valve suggests  mild AS; visually valve opens well.   2. Left ventricular ejection fraction, by estimation, is 60 to 65%. The  left ventricle has normal function. The left ventricle has no regional  wall motion abnormalities. Left ventricular diastolic parameters are  consistent with Grade I  diastolic  dysfunction (impaired relaxation). There is the interventricular septum is  flattened in systole and diastole, consistent with right ventricular  pressure and volume overload.   3. Right ventricular systolic function is normal. The right ventricular  size is normal. Tricuspid regurgitation signal is inadequate for assessing  PA pressure.   4. The mitral valve is normal in structure. No evidence of mitral valve  regurgitation. No evidence of mitral stenosis.   5. The aortic valve has an indeterminant number of cusps. Aortic valve  regurgitation is not visualized. Mild aortic valve stenosis.   6. The inferior vena cava is normal in size with greater  than 50%  respiratory variability, suggesting right atrial pressure of 3 mmHg.   Comparison(s): No prior Echocardiogram.   Patient Profile   64 y.o. female with PMH of HTN, HLD, DM II, CVA 2015, carotid artery disease, lung CA on immunotherapy, COPD, OSA not on CPAP and tobacco use who presented with acute respiratory failure. Initially treated for possible COPD exacerbation and PNA with abx, steroid and breathing treatment. Cardiology consulted due to concern of possible acute diastolic heart failure.   Assessment & Plan   Acute respiratory failure  - proBNP 2443. D-dimer negative 07/02/2024  - received a single dose of 40mg  IV lasix  on 1/14, symptom slightly improving, however on exam, she appears to be euvolemic. I doubt this is acute diastolic CHF, may try another dose of IV lasix  today as a trial. Potassium improved. Resp panel negative for RSV, COVID and flu, however still cannot completely rule out possibility of viral illness. Her lung is passing air surprising well on exam, no significant wheezing like asthma or markedly diminished breath sound seen in COPD exacerbation. Coughing started on Sunday. Could be acute pneumonitis. Consider a 20 Pathogen Resp Viral Panel to rule out other things likely Rhinovirus. CT of chest to rule  cancer  Mildly elevated trop: denies any chest pain. Likely demand ischemia.   Anemia: slightly worse anemia, hgb 8.2  Hypertension Hyperlipidemia: on rosuvastatin . DM II COPD    For questions or updates, please contact [facility] Please consult [email or web address] for contact info under     Signed, Scot Ford, PA  07/05/2024, 10:47 AM    I have seen and examined the patient along with Scot Ford, PA.  I have reviewed the chart, notes and new data.  I agree with PA/NP's note.  Key new complaints: slight improvement in dyspnea, O2 down to 3l/min Key examination changes: no overt signs of hypervolemia Key new findings / data: stable renal parameters and K improved.  PLAN: We can try to push diuretics again now that K is normal, but I continue to believe this is not pulmonary edema causing her hypoxia. Consider immune checkpoint inhibitor related pneumonitis or lymphangitic spread of the lung cancer. If hypoxia and CXR changes persist after additional diuresis, recommend chest CT. Immune checkpoint inhibitors can also cause myocarditis (although reportedly with this particular agent the incidence in clinical trials is <1%). There is no evidence of pericarditis on ECG or echo, she does not have pleuritic cough, the increase in troponin is negligible and the RV is disproportionately involved. Myocarditis appears unlikely.   Jerel Balding, MD, [facility] [phone] 07/05/2024, 2:14 PM   "

## 2024-07-05 NOTE — Progress Notes (Signed)
 [facility]  PROGRESS NOTE  Susan Farley FMW:[id number] DOB: 1960-08-21 DOA: 07/02/2024 PCP: [facility]   Brief HPI:    64 y.o. 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     Patient presents to our facility with worsening dyspnea, nasal congestion and cough found to be notably hypoxic with reported sick contacts over the holidays.  No recent travel other than locally for family holiday events.  Given profound hypoxia from baseline hospitalist called for admission.      Assessment/Plan:   Acute hypoxic respiratory failure, COPD versus asthma exacerbation, POA Concurrent community-acquired pneumonia, POA - Home med rec pending, restart LABA [name] ICS as appropriate - Continue as needed nebs, IV steroids x 1, transition to p.o. prednisone  i - Wheeze already markedly improved although remains hypoxic even at rest (baseline is on room air) - X-ray with notable right middle to upper lobe opacity concerning for overlying infection, azithromycin , ceftriaxone  x 5 days - No clear indication for infection, flu RSV COVID swab negative - Respiratory viral panel ordered  Acute diastolic CHF -BNP significantly elevated at 2443 -Diuresed well with IV Lasix  40 mg x 1 -Echocardiogram showed EF of 60 to 65% with grade 1 diastolic dysfunction. -Blood pressure is soft, likely will need more diuresis. 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) - Resection 2018 - Currently on  immunotherapy -If no improvement with diuresis, consider getting CT chest   OSA/obesity hypoventilation syndrome noncompliant with cpap  -willing to trial CPAP here  HLD - Continue rosuvastatin   Tobacco abuse, ongoing -lengthy discussion in regards to cessation given above acute hypoxia, COPD, cancer history      DVT prophylaxis: Lovenox   Medications     azithromycin   500 mg Oral Daily   budesonide  (PULMICORT ) nebulizer solution  2 mg Nebulization Q12H   enoxaparin  (LOVENOX ) injection  40 mg Subcutaneous Q24H   insulin  aspart  0-15 Units Subcutaneous TID WC   insulin  aspart  0-5 Units Subcutaneous QHS   multivitamin with minerals  1 tablet Oral Daily   predniSONE   40 mg Oral Q breakfast   Ensure Max Protein  11 oz Oral Daily   rosuvastatin   40 mg Oral Daily     Data Reviewed:   CBG:  Recent Labs  Lab 07/04/24 0902 07/04/24 1151 07/04/24 1724 07/04/24 2239 07/05/24 0851  GLUCAP 99 127* 157* 110* 100*    SpO2: 100 % O2 Flow Rate (L/min): 3 L/min    Vitals:   07/04/24 1941 07/04/24 2213 07/05/24 0300 07/05/24 0852  BP:  108/88 (!) (P) 144/97 131/87  Pulse:  (!) 106 (!) (P) 109 (!) 102  Resp:      Temp:  (!) 97.5 F (36.4 C) (P) 97.6 F (36.4 C) 98 F (36.7 C)  TempSrc:      SpO2: 98% 100% 98% 100%  Weight:      Height:          Data Reviewed:  Basic Metabolic Panel: Recent Labs  Lab 07/02/24 1528 07/02/24 1539 07/03/24 0448 07/04/24 0229 07/04/24 1900  NA 142 142 142 145  --  K 3.1* 3.1* 3.7 2.8* 4.1  CL 102 100 103 103  --   CO2 31  --  30 32  --   GLUCOSE 72 72 110* 103*  --   BUN 15 15 16 15   --   CREATININE 1.05* 1.20* 0.96 0.87  --   CALCIUM  9.4  --  9.2 9.6  --     CBC: Recent Labs  Lab 07/02/24 1528 07/02/24 1539 07/03/24 0448 07/04/24 0229  WBC 5.4  --  6.4 8.1  NEUTROABS 4.4  --   --   --   HGB 8.1* 9.2* 7.6* 8.2*  HCT 29.7* 27.0* 27.7* 29.6*  MCV 95.5  --  94.9 93.4  PLT 270  --  298 283    LFT Recent Labs   Lab 07/02/24 1528 07/04/24 0229  AST 29 20  ALT 24 19  ALKPHOS 87 79  BILITOT 0.2 0.3  PROT 6.5 6.5  ALBUMIN 3.6 3.6     Antibiotics: Anti-infectives (From admission, onward)    Start     Dose/Rate Route Frequency Ordered Stop   07/03/24 1700  cefTRIAXone  (ROCEPHIN ) 2 g in sodium chloride  0.9 % 100 mL IVPB        2 g 200 mL/hr over 30 Minutes Intravenous Every 24 hours 07/02/24 1905 07/08/24 1659   07/03/24 1000  azithromycin  (ZITHROMAX ) tablet 500 mg        500 mg Oral Daily 07/02/24 1905 07/08/24 0959   07/02/24 1630  cefTRIAXone  (ROCEPHIN ) 1 g in sodium chloride  0.9 % 100 mL IVPB        1 g 200 mL/hr over 30 Minutes Intravenous  Once 07/02/24 1623 07/02/24 1826   07/02/24 1630  azithromycin  (ZITHROMAX ) tablet 500 mg        500 mg Oral  Once 07/02/24 1623 07/02/24 1733        CONSULTS   Code Status:   Family Communication:      Subjective   Patient seen and examined.  Continues to have shortness of breath on exertion.   Objective    Physical Examination:  General-appears in no acute distress Heart-S1-S2, regular, no murmur auscultated Lungs-clear to auscultation bilaterally, no wheezing or crackles auscultated Abdomen-soft, nontender, no organomegaly Extremities-no edema in the lower extremities Neuro-alert, oriented x3, no focal deficit noted          [name] S [place] Cordts   [facility] If 7PM-7AM, please contact night-coverage at [email or web address], Office  [phone]   07/05/2024, 9:16 AM  LOS: 3 days

## 2024-07-05 NOTE — Progress Notes (Signed)
 Physical Therapy Treatment Patient Details Name: Susan Farley MRN: [id number] DOB: [birth year] Today's Date: 07/05/2024   History of Present Illness Pt is 64 year old presented to [facility] on  07/02/24 for SOB. Pt with acute respiratory failure with hypoxia due to copd vs asthma vs PNA. PMH - DM II, HTN, CVA without residual, asthma, lung CA, PE depression, breast CA, neuropathy.    PT Comments  Pt received in supine and agreeable to session. Pt able to perform bed mobility and transfers with CGA for safety and cues for sequencing. Activity tolerance limited due to shortness of breath and quick fatigue. Pt able to tolerate additional stand from recliner and perform static standing marches before needing a seated rest break. Pt states I feel like I can't breath at end of session, SpO2 98% on 3L. Pt demonstrates some impaired memory and awareness during session. Pt continues to benefit from PT services to progress toward functional mobility goals.     If plan is discharge home, recommend the following: A little help with walking and/or transfers;A little help with bathing/dressing/bathroom   Can travel by private vehicle        Equipment Recommendations  Rolling walker (2 wheels)    Recommendations for Other Services       Precautions / Restrictions Precautions Precautions: Fall;Other (comment) Precaution/Restrictions Comments: watch SpO2 Restrictions Weight Bearing Restrictions Per Provider Order: No     Mobility  Bed Mobility Overal bed mobility: Needs Assistance Bed Mobility: Supine to Sit     Supine to sit: HOB elevated, Contact guard     General bed mobility comments: increased time and use of bed features    Transfers Overall transfer level: Needs assistance Equipment used: Rolling walker (2 wheels) Transfers: Sit to/from Stand, Bed to chair/wheelchair/BSC Sit to Stand: Contact guard assist   Step pivot transfers: Contact guard assist       General transfer  comment: cues for hand placement and RW management    Ambulation/Gait               General Gait Details: unable due to shortness of breath and fatigue   Stairs             Wheelchair Mobility     Tilt Bed    Modified Rankin (Stroke Patients Only)       Balance Overall balance assessment: Needs assistance Sitting-balance support: Single extremity supported, No upper extremity supported, Feet supported Sitting balance-Leahy Scale: Good Sitting balance - Comments: EOB   Standing balance support: Bilateral upper extremity supported, During functional activity Standing balance-Leahy Scale: Fair Standing balance comment: with RW support                            Communication Communication Communication: No apparent difficulties  Cognition Arousal: Alert Behavior During Therapy: Anxious, WFL for tasks assessed/performed   PT - Cognitive impairments: Memory, Awareness, Problem solving                       PT - Cognition Comments: Pt required repeated cues due to impaired memory, such as where the nurse call button was. Following commands: Impaired Following commands impaired: Only follows one step commands consistently, Follows multi-step commands inconsistently, Follows multi-step commands with increased time    Cueing Cueing Techniques: Verbal cues  Exercises General Exercises - Lower Extremity Hip Flexion/Marching: AROM, Standing, Both, 5 reps    General Comments General comments (skin integrity,  edema, etc.): SpO2 90-98% on 3L, but difficult to obtain stable pleth      Pertinent Vitals/Pain Pain Assessment Pain Assessment: Faces Faces Pain Scale: No hurt     PT Goals (current goals can now be found in the care plan section) Acute Rehab PT Goals Patient Stated Goal: stay till feeling better PT Goal Formulation: With patient Time For Goal Achievement: 07/18/24 Progress towards PT goals: Progressing toward goals     Frequency    Min 2X/week       AM-PAC PT 6 Clicks Mobility   Outcome Measure  Help needed turning from your back to your side while in a flat bed without using bedrails?: A Little Help needed moving from lying on your back to sitting on the side of a flat bed without using bedrails?: A Little Help needed moving to and from a bed to a chair (including a wheelchair)?: A Little Help needed standing up from a chair using your arms (e.g., wheelchair or bedside chair)?: A Little Help needed to walk in hospital room?: Total Help needed climbing 3-5 steps with a railing? : Total 6 Click Score: 14    End of Session Equipment Utilized During Treatment: Oxygen;Gait belt Activity Tolerance: Patient limited by fatigue Patient left: with call bell/phone within reach;in chair;with chair alarm set Nurse Communication: Mobility status PT Visit Diagnosis: Muscle weakness (generalized) (M62.81);Other abnormalities of gait and mobility (R26.89)     Time: 0938-1000 PT Time Calculation (min) (ACUTE ONLY): 22 min  Charges:    $Therapeutic Activity: 8-22 mins PT General Charges $$ ACUTE PT VISIT: 1 Visit                    Darryle George, PTA Acute Rehabilitation Services Secure Chat Preferred  Office:([phone]) [phone]    Darryle George 07/05/2024, 10:48 AM

## 2024-07-05 NOTE — Progress Notes (Signed)
 Heart Failure Navigator Progress Note  Assessed for Heart & Vascular TOC clinic readiness.  Patient does not meet criteria due to symptoms are likely multifactorial with her current COPD exacerbation, untreated OSA, ongoing tobacco use and acute on chronic anemia per MD. Will not schedule HF TOC appt at this time.  Navigator available for reassessment of patient.   Duwaine Plant, PharmD, BCPS Heart Failure Stewardship Pharmacist Phone [phone]

## 2024-07-05 NOTE — Plan of Care (Signed)

## 2024-07-06 ENCOUNTER — Inpatient Hospital Stay (HOSPITAL_COMMUNITY)

## 2024-07-06 DIAGNOSIS — J9601 Acute respiratory failure with hypoxia: Secondary | ICD-10-CM | POA: Diagnosis not present

## 2024-07-06 LAB — BASIC METABOLIC PANEL WITH GFR
Anion gap: 9 (ref 5–15)
BUN: 15 mg/dL (ref 8–23)
CO2: 31 mmol/L (ref 22–32)
Calcium: 9.7 mg/dL (ref 8.9–10.3)
Chloride: 103 mmol/L (ref 98–111)
Creatinine, Ser: 0.78 mg/dL (ref 0.44–1.00)
GFR, Estimated: >60 mL/min
Glucose, Bld: 81 mg/dL (ref 70–99)
Potassium: 3.3 mmol/L — ABNORMAL LOW (ref 3.5–5.1)
Sodium: 143 mmol/L (ref 135–145)

## 2024-07-06 LAB — GLUCOSE, CAPILLARY
Glucose-Capillary: 103 mg/dL — ABNORMAL HIGH (ref 70–99)
Glucose-Capillary: 126 mg/dL — ABNORMAL HIGH (ref 70–99)

## 2024-07-06 MED ORDER — METHYLPREDNISOLONE SODIUM SUCC 125 MG IJ SOLR
125.0000 mg | Freq: Once | INTRAMUSCULAR | Status: AC
  Administered 2024-07-06: 125 mg via INTRAVENOUS
  Filled 2024-07-06: qty 2

## 2024-07-06 MED ORDER — DIPHENHYDRAMINE HCL 25 MG PO CAPS
50.0000 mg | ORAL_CAPSULE | Freq: Once | ORAL | Status: AC
  Administered 2024-07-06: 50 mg via ORAL
  Filled 2024-07-06: qty 2

## 2024-07-06 MED ORDER — FUROSEMIDE 20 MG PO TABS: 20.0000 mg | ORAL_TABLET | Freq: Every day | ORAL | 3 refills | Status: AC

## 2024-07-06 MED ORDER — SPIRONOLACTONE 25 MG PO TABS: 12.5000 mg | ORAL_TABLET | Freq: Every day | ORAL | 2 refills | Status: AC

## 2024-07-06 MED ORDER — FUROSEMIDE 20 MG PO TABS
20.0000 mg | ORAL_TABLET | Freq: Every day | ORAL | Status: DC
  Administered 2024-07-06: 20 mg via ORAL
  Filled 2024-07-06: qty 1

## 2024-07-06 MED ORDER — POTASSIUM CHLORIDE CRYS ER 20 MEQ PO TBCR
40.0000 meq | EXTENDED_RELEASE_TABLET | Freq: Once | ORAL | Status: AC
  Administered 2024-07-06: 40 meq via ORAL
  Filled 2024-07-06: qty 2

## 2024-07-06 MED ORDER — SPIRONOLACTONE 12.5 MG HALF TABLET
12.5000 mg | ORAL_TABLET | Freq: Every day | ORAL | Status: DC
  Administered 2024-07-06: 12.5 mg via ORAL
  Filled 2024-07-06: qty 1

## 2024-07-06 MED ORDER — PREDNISONE 10 MG PO TABS: ORAL_TABLET | ORAL | 0 refills | Status: AC

## 2024-07-06 MED ORDER — IOHEXOL 350 MG/ML SOLN
75.0000 mL | Freq: Once | INTRAVENOUS | Status: AC | PRN
  Administered 2024-07-06: 75 mL via INTRAVENOUS

## 2024-07-06 NOTE — Plan of Care (Signed)
  Problem: Education: Goal: Ability to describe self-care measures that may prevent or decrease complications (Diabetes Survival Skills Education) will improve Outcome: Adequate for Discharge Goal: Individualized Educational Video(s) Outcome: Adequate for Discharge   Problem: Coping: Goal: Ability to adjust to condition or change in health will improve Outcome: Adequate for Discharge   Problem: Fluid Volume: Goal: Ability to maintain a balanced intake and output will improve Outcome: Adequate for Discharge   Problem: Health Behavior/Discharge Planning: Goal: Ability to identify and utilize available resources and services will improve Outcome: Adequate for Discharge Goal: Ability to manage health-related needs will improve Outcome: Adequate for Discharge   Problem: Metabolic: Goal: Ability to maintain appropriate glucose levels will improve Outcome: Adequate for Discharge   Problem: Nutritional: Goal: Maintenance of adequate nutrition will improve Outcome: Adequate for Discharge Goal: Progress toward achieving an optimal weight will improve Outcome: Adequate for Discharge   Problem: Skin Integrity: Goal: Risk for impaired skin integrity will decrease Outcome: Adequate for Discharge   Problem: Tissue Perfusion: Goal: Adequacy of tissue perfusion will improve Outcome: Adequate for Discharge   Problem: Education: Goal: Knowledge of General Education information will improve Description: Including pain rating scale, medication(s)/side effects and non-pharmacologic comfort measures Outcome: Adequate for Discharge   Problem: Health Behavior/Discharge Planning: Goal: Ability to manage health-related needs will improve Outcome: Adequate for Discharge   Problem: Clinical Measurements: Goal: Ability to maintain clinical measurements within normal limits will improve Outcome: Adequate for Discharge Goal: Will remain free from infection Outcome: Adequate for Discharge Goal:  Diagnostic test results will improve Outcome: Adequate for Discharge Goal: Respiratory complications will improve Outcome: Adequate for Discharge Goal: Cardiovascular complication will be avoided Outcome: Adequate for Discharge   Problem: Activity: Goal: Risk for activity intolerance will decrease Outcome: Adequate for Discharge   Problem: Nutrition: Goal: Adequate nutrition will be maintained Outcome: Adequate for Discharge   Problem: Coping: Goal: Level of anxiety will decrease Outcome: Adequate for Discharge   Problem: Elimination: Goal: Will not experience complications related to bowel motility Outcome: Adequate for Discharge Goal: Will not experience complications related to urinary retention Outcome: Adequate for Discharge   Problem: Pain Managment: Goal: General experience of comfort will improve and/or be controlled Outcome: Adequate for Discharge   Problem: Safety: Goal: Ability to remain free from injury will improve Outcome: Adequate for Discharge   Problem: Skin Integrity: Goal: Risk for impaired skin integrity will decrease Outcome: Adequate for Discharge   Problem: Education: Goal: Knowledge of disease or condition will improve Outcome: Adequate for Discharge Goal: Knowledge of the prescribed therapeutic regimen will improve Outcome: Adequate for Discharge Goal: Individualized Educational Video(s) Outcome: Adequate for Discharge   Problem: Activity: Goal: Ability to tolerate increased activity will improve Outcome: Adequate for Discharge Goal: Will verbalize the importance of balancing activity with adequate rest periods Outcome: Adequate for Discharge   Problem: Respiratory: Goal: Ability to maintain a clear airway will improve Outcome: Adequate for Discharge Goal: Levels of oxygenation will improve Outcome: Adequate for Discharge Goal: Ability to maintain adequate ventilation will improve Outcome: Adequate for Discharge   Problem:  Activity: Goal: Ability to tolerate increased activity will improve Outcome: Adequate for Discharge   Problem: Clinical Measurements: Goal: Ability to maintain a body temperature in the normal range will improve Outcome: Adequate for Discharge   Problem: Respiratory: Goal: Ability to maintain adequate ventilation will improve Outcome: Adequate for Discharge Goal: Ability to maintain a clear airway will improve Outcome: Adequate for Discharge

## 2024-07-06 NOTE — Discharge Summary (Addendum)
 " Physician Discharge Summary   Patient: Susan Farley MRN: [id number] DOB: [birth year]  Admit date:     07/02/2024  Discharge date: 07/06/24  Discharge Physician: Sabas GORMAN Brod   [facility]   Recommendations at discharge:   Follow-up PCP in 1 week Check BMP in 1 week for checking serum potassium level Patient to go home with home health PT, OT, RN  Discharge Diagnoses: Principal Problem:   Acute respiratory failure with hypoxia (HCC) Active Problems:   Acute on chronic diastolic heart failure (HCC)   Chronic obstructive pulmonary disease (HCC)  Resolved Problems:   * No resolved hospital problems. *  Hospital Course:  64 y.o. 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     Patient presents to our facility with worsening dyspnea, nasal congestion and cough found to be notably hypoxic with reported sick contacts over the holidays.  No recent travel other than locally for family holiday events.  Given profound hypoxia from baseline hospitalist called for admission.    Assessment and Plan:  Acute hypoxic respiratory failure, COPD versus asthma exacerbation, POA Concurrent community-acquired pneumonia, POA - Home med rec pending, restart LABA [place] Soy ICS as appropriate - Continue as needed nebs, IV steroids x 1, transition to p.o. prednisone  i - Wheeze already markedly improved although remains hypoxic even at rest (baseline is on room air) - X-ray with notable right middle to upper lobe opacity concerning for overlying infection, azithromycin , ceftriaxone  x 5 days -Completed antibiotics in the hospital - CT chest obtained today again shows pneumonia and no other abnormality   Acute diastolic CHF -BNP significantly elevated at 2443 -Diuresed well with IV  Lasix  40 mg x 1 -Echocardiogram showed EF of 60 to 65% with grade 1 diastolic dysfunction. 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 2018 - Currently on immunotherapy - Follow-up pulmonology as outpatient   OSA/obesity hypoventilation syndrome noncompliant with cpap  -willing to trial CPAP here   HLD - Continue rosuvastatin    Tobacco abuse, ongoing -lengthy discussion in regards to cessation given above acute hypoxia, COPD, cancer history          Consultants: Cardiology Procedures performed:   Disposition: Home Diet recommendation:  Regular diet DISCHARGE MEDICATION: Allergies as of 07/06/2024       Reactions   Shellfish Allergy Anaphylaxis   Diaper Rash Products Other (See Comments)   Unknown    Hydrochlorothiazide  Other (See Comments)   Unknown    Iodine Other (See Comments)   Unknown    Lansoprazole Other (See Comments)   Unknown    Metformin  Diarrhea, Nausea Only    GI Upset (intolerance)   Omeprazole Other (See Comments)   Unknown    Pravastatin Other (See Comments)   Unknown    Simvastatin  Other (See Comments)   Myalgias (intolerance)   Tomato (diagnostic) Other (See Comments)   Unknown    Bupropion Hives, Rash   Empagliflozin Other (See  Comments)    Increased frequency of urination   Pregabalin Other (See Comments)   Muscle pain   Semaglutide Nausea And Vomiting        Medication List     STOP taking these medications    hydrochlorothiazide  12.5 MG tablet Commonly known as:  HYDRODIURIL    pioglitazone 30 MG tablet Commonly known as: ACTOS   potassium chloride  20 MEQ/15ML (10%) Soln       TAKE these medications    acetaminophen  325 MG tablet Commonly known as: TYLENOL  Take 650 mg by mouth 2 (two) times daily as needed for moderate pain (pain score 4-6) or mild pain (pain score 1-3).   albuterol  108 (90 Base) MCG/ACT inhaler Commonly known as: VENTOLIN  HFA Inhale 1-2 puffs into the lungs every 4 (four) hours as needed for shortness of breath or wheezing.   anastrozole 1 MG tablet Commonly known as: ARIMIDEX Take 1 mg by mouth daily.   ascorbic acid 500 MG tablet Commonly known as: VITAMIN C Take 500 mg by mouth every Monday, Wednesday, and Friday.   aspirin  EC 325 MG tablet Take 1 tablet (325 mg total) by mouth daily.   azelastine 0.1 % nasal spray Commonly known as: ASTELIN Place 2 sprays into both nostrils daily.   bisacodyl  5 MG EC tablet Commonly known as: DULCOLAX Take 5 mg by mouth daily as needed for mild constipation or moderate constipation.   budesonide -formoterol 160-4.5 MCG/ACT inhaler Commonly known as: SYMBICORT Inhale 2 puffs into the lungs daily as needed (asthma).   Carboxymethylcellulose Sod PF 0.5 % Soln Apply 1 drop to eye every 2 (two) hours as needed (dry eyes).   dexamethasone  0.7 MG Impl Commonly known as: OZURDEX  0.7 mg by Intravitreal route every 3 (three) months.   dorzolamide -timolol  2-0.5 % ophthalmic solution Commonly known as: COSOPT Place 1 drop into both eyes daily.   Dulaglutide 3 MG/0.5ML Soaj Inject 3 mg into the skin once a week.   EPINEPHrine 0.3 mg/0.3 mL Soaj injection Commonly known as: EPI-PEN Inject 0.3 mg into the muscle as needed for anaphylaxis.   ferrous gluconate 324 MG tablet Commonly known as: FERGON Take 324 mg by mouth every Monday, Wednesday, and Friday.   fexofenadine 60 MG tablet Commonly known as: ALLEGRA Take 120 mg by mouth 2 (two) times daily.   furosemide  20 MG  tablet Commonly known as: LASIX  Take 1 tablet (20 mg total) by mouth daily. Start taking on: July 07, 2024   glucose 4 GM chewable tablet Chew 1 tablet by mouth as needed for low blood sugar.   Miebo 1.338 GM/ML Soln Generic drug: Perfluorohexyloctane Place 1 drop into both eyes 4 (four) times daily.   pantoprazole  40 MG tablet Commonly known as: PROTONIX  Take 40 mg by mouth 2 (two) times daily.   polyethylene glycol powder 17 GM/SCOOP powder Commonly known as: GLYCOLAX /MIRALAX  Take 17 g by mouth daily as needed for mild constipation or moderate constipation. Dissolve 1 capful (17g) in 4-8 ounces of liquid and take by mouth daily.   predniSONE  10 MG tablet Commonly known as: DELTASONE  Prednisone  40 mg po daily x 1 day then Prednisone  30 mg po daily x 1 day then Prednisone  20 mg po daily x 1 day then Prednisone  10 mg daily x 1 day then stop... Start taking on: July 07, 2024   pregabalin 150 MG capsule Commonly known as: LYRICA Take 150 mg by mouth 2 (two) times daily.   QUEtiapine  25 MG tablet Commonly known as: SEROQUEL   Take 25 mg by mouth at bedtime.   rosuvastatin  40 MG tablet Commonly known as: CRESTOR  Take 40 mg by mouth daily.   sodium chloride  0.65 % nasal spray Commonly known as: OCEAN Place 1 spray into the nose as needed (nose dryness).   spironolactone  25 MG tablet Commonly known as: ALDACTONE  Take 0.5 tablets (12.5 mg total) by mouth daily.   venlafaxine  XR 150 MG 24 hr capsule Commonly known as: EFFEXOR -XR Take 300 mg by mouth daily.   Vevye 0.1 % Soln Generic drug: cycloSPORINE Apply 1 drop to eye 2 (two) times daily.   Xdemvy 0.25 % Soln Generic drug: Lotilaner Place 1 drop into both eyes 2 (two) times daily.               Durable Medical Equipment  (From admission, onward)           Start     Ordered   07/04/24 1403  For home use only DME Walker rolling  Once       Question Answer Comment  Walker: With 5 Inch Wheels    Patient needs a walker to treat with the following condition Acute hypoxic respiratory failure (HCC)      07/04/24 1402            Contact information for follow-up providers     Guiteau-Laurent, Angelique D, NP. Schedule an appointment as soon as possible for a visit.   Specialty: Nurse Practitioner [facility]: [facility] and [facility] up in the next 7-10 days. Check BMP at PCP office for potassium level Contact information: [address] [place] [address] [phone]              Contact information for after-discharge care     Home Medical Care     [facility] [place] [facility]) .   Service: Home Health Services Contact information: [address] [place] [address] [place]  [address] [phone]                    Discharge Exam: Fredricka Weights   07/02/24 1443 07/04/24 1800  Weight: 102.5 kg 97.6 kg   General-appears in no acute distress Heart-S1-S2, regular, no murmur auscultated Lungs-clear to auscultation bilaterally, no wheezing or crackles auscultated Abdomen-soft, nontender, no organomegaly Extremities-no edema in the lower extremities Neuro-alert, oriented x3, no focal deficit noted  Condition at discharge: good  The results of significant diagnostics from this hospitalization (including imaging, microbiology, ancillary and laboratory) are listed below for reference.   Imaging Studies: CT Angio Chest Pulmonary Embolism (PE) W or WO Contrast Result Date: 07/06/2024 EXAM: CTA CHEST 07/06/2024 10:57:43 AM TECHNIQUE: CTA of the chest was performed after the administration of 75 mL of iohexol  (OMNIPAQUE ) 350 MG/ML injection. Multiplanar reformatted images are provided for review. MIP images are provided for review. Automated exposure control, iterative reconstruction, and/or weight based adjustment of the mA/kV was utilized to reduce the radiation dose to as low as reasonably achievable.  COMPARISON: Chest radiograph dated 07/02/2024 and prior studies. CLINICAL HISTORY: Dyspnea, chronic, chest wall or pleura disease suspected; Pneumonia, pleural effusion suspected FINDINGS: PULMONARY ARTERIES: Pulmonary arteries are adequately opacified for evaluation. No acute pulmonary embolus. Dilated main pulmonary artery measuring up to 3.4 cm. MEDIASTINUM: Coronary artery calcifications. The heart and pericardium demonstrate no acute abnormality. There is no acute abnormality of the thoracic aorta. LYMPH NODES: Right paratracheal lymph node measuring up to 1.1 cm, previously measuring up to  1.6 cm in short axis. LUNGS AND PLEURA: Multifocal ground glass and consolidative opacities throughout the right lung. Right upper lobe pleural-based lesion measuring approximately 2.0 x 1.4 cm, previously measuring 1.8 x 1.3 cm. Likely post-radiation fibrotic changes in the anterior right upper and middle lobes. Diffuse nonspecific peribronchial thickening. No pulmonary edema. No evidence of pleural effusion or pneumothorax. UPPER ABDOMEN: Limited images of the upper abdomen are unremarkable. SOFT TISSUES AND BONES: No acute bone or soft tissue abnormality. IMPRESSION: 1. No pulmonary embolism. Dilated main pulmonary artery, which may represent chronic pulmonary hypertension. 2. Multifocal right lung ground glass and consolidative opacities, may represent infection and/or inflammatory etiologies. 3. Slight interval increase in size of right upper lobe pleural-based lesion. 4. Slight interval decrease in size of previously seen mediastinal lymph nodes. Electronically signed by: Michaeline Blanch MD 07/06/2024 11:34 AM EST RP Workstation: [id number]   ECHOCARDIOGRAM COMPLETE Result Date: 07/03/2024    ECHOCARDIOGRAM REPORT   Patient Name:   NAVEA WOODROW Date of Exam: 07/03/2024 Medical Rec #:  [id number]        Height:       64.0 in Accession #:    [id number]       Weight:       226.0 lb Date of Birth:  06/01/1961         BSA:           2.061 m Patient Age:    64 years         BP:           114/77 mmHg Patient Gender: F                HR:           96 bpm. Exam Location:  Inpatient Procedure: 2D Echo, Cardiac Doppler and Color Doppler (Both Spectral and Color            Flow Doppler were utilized during procedure). Indications:    CHF I50.31  History:        Patient has prior history of Echocardiogram examinations, most                 recent 07/13/2022. Risk Factors:Hypertension.  Sonographer:    Nathanel Devonshire Referring Phys: MILBERT SABAS RAMAN Jayshun Galentine IMPRESSIONS  1. Mildly elevated mean gradient (12 mmHg) across aortic valve suggests mild AS; visually valve opens well.  2. Left ventricular ejection fraction, by estimation, is 60 to 65%. The left ventricle has normal function. The left ventricle has no regional wall motion abnormalities. Left ventricular diastolic parameters are consistent with Grade I diastolic dysfunction (impaired relaxation). There is the interventricular septum is flattened in systole and diastole, consistent with right ventricular pressure and volume overload.  3. Right ventricular systolic function is normal. The right ventricular size is normal. Tricuspid regurgitation signal is inadequate for assessing PA pressure.  4. The mitral valve is normal in structure. No evidence of mitral valve regurgitation. No evidence of mitral stenosis.  5. The aortic valve has an indeterminant number of cusps. Aortic valve regurgitation is not visualized. Mild aortic valve stenosis.  6. The inferior vena cava is normal in size with greater than 50% respiratory variability, suggesting right atrial pressure of 3 mmHg. Comparison(s): No prior Echocardiogram. FINDINGS  Left Ventricle: Left ventricular ejection fraction, by estimation, is 60 to 65%. The left ventricle has normal function. The left ventricle has no regional wall motion abnormalities. The left ventricular internal cavity size was normal in size. There is  no left ventricular  hypertrophy. The interventricular septum is flattened in systole and diastole, consistent with right ventricular pressure and volume overload. Left ventricular diastolic parameters are consistent with Grade I diastolic dysfunction (impaired relaxation). Right Ventricle: The right ventricular size is normal. Right ventricular systolic function is normal. Tricuspid regurgitation signal is inadequate for assessing PA pressure. The tricuspid regurgitant velocity is 2.37 m/s, and with an assumed right atrial  pressure of 3 mmHg, the estimated right ventricular systolic pressure is 25.5 mmHg. Left Atrium: Left atrial size was normal in size. Right Atrium: Right atrial size was normal in size. Pericardium: There is no evidence of pericardial effusion. Mitral Valve: The mitral valve is normal in structure. No evidence of mitral valve regurgitation. No evidence of mitral valve stenosis. Tricuspid Valve: The tricuspid valve is normal in structure. Tricuspid valve regurgitation is trivial. No evidence of tricuspid stenosis. Aortic Valve: The aortic valve has an indeterminant number of cusps. Aortic valve regurgitation is not visualized. Mild aortic stenosis is present. Aortic valve mean gradient measures 12.0 mmHg. Aortic valve peak gradient measures 23.2 mmHg. Aortic valve  area, by VTI measures 2.23 cm. Pulmonic Valve: The pulmonic valve was normal in structure. Pulmonic valve regurgitation is not visualized. No evidence of pulmonic stenosis. Aorta: The aortic root is normal in size and structure. Venous: The inferior vena cava is normal in size with greater than 50% respiratory variability, suggesting right atrial pressure of 3 mmHg. IAS/Shunts: No atrial level shunt detected by color flow Doppler. Additional Comments: Mildly elevated mean gradient (12 mmHg) across aortic valve suggests mild AS; visually valve opens well.  LEFT VENTRICLE PLAX 2D LVIDd:         4.60 cm     Diastology LVIDs:         3.00 cm     LV e' medial:     7.18 cm/s LV PW:         0.90 cm     LV E/e' medial:  14.1 LV IVS:        0.90 cm     LV e' lateral:   10.30 cm/s LVOT diam:     2.00 cm     LV E/e' lateral: 9.8 LV SV:         99 LV SV Index:   48 LVOT Area:     3.14 cm LV IVRT:       92 msec  LV Volumes (MOD) LV vol d, MOD A2C: 98.6 ml LV vol d, MOD A4C: 59.2 ml LV vol s, MOD A2C: 28.9 ml LV vol s, MOD A4C: 23.1 ml LV SV MOD A2C:     69.7 ml LV SV MOD A4C:     59.2 ml LV SV MOD BP:      54.2 ml RIGHT VENTRICLE          IVC RV Basal diam:  3.20 cm  IVC diam: 2.00 cm TAPSE (M-mode): 1.6 cm                          PULMONARY VEINS                          Diastolic Velocity: 84.60 cm/s                          S/D Velocity:       0.80  Systolic Velocity:  69.40 cm/s LEFT ATRIUM             Index        RIGHT ATRIUM           Index LA diam:        3.10 cm 1.50 cm/m   RA Area:     16.40 cm LA Vol (A2C):   41.2 ml 19.99 ml/m  RA Volume:   39.80 ml  19.31 ml/m LA Vol (A4C):   32.0 ml 15.53 ml/m LA Biplane Vol: 36.9 ml 17.91 ml/m  AORTIC VALVE                     PULMONIC VALVE AV Area (Vmax):    1.93 cm      PV Vmax:       1.58 m/s AV Area (Vmean):   2.18 cm      PV Peak grad:  10.0 mmHg AV Area (VTI):     2.23 cm AV Vmax:           241.00 cm/s AV Vmean:          163.000 cm/s AV VTI:            0.445 m AV Peak Grad:      23.2 mmHg AV Mean Grad:      12.0 mmHg LVOT Vmax:         148.00 cm/s LVOT Vmean:        113.000 cm/s LVOT VTI:          0.316 m LVOT/AV VTI ratio: 0.71  AORTA Ao Root diam: 2.70 cm Ao Asc diam:  2.50 cm MITRAL VALVE                TRICUSPID VALVE MV Area (PHT): 3.60 cm     TR Peak grad:   22.5 mmHg MV Decel Time: 211 msec     TR Vmax:        237.00 cm/s MV E velocity: 101.00 cm/s MV A velocity: 136.00 cm/s  SHUNTS MV E/A ratio:  0.74         Systemic VTI:  0.32 m                             Systemic Diam: 2.00 cm Redell Shallow MD Electronically signed by Redell Shallow MD Signature Date/Time: 07/03/2024/3:36:24 PM     Final    DG Chest 2 View Result Date: 07/02/2024 CLINICAL DATA:  Shortness of breath, cough EXAM: CHEST - 2 VIEW COMPARISON:  10/26/2019 FINDINGS: Frontal and lateral views of the chest demonstrate an enlarged cardiac silhouette. There is patchy right perihilar airspace disease and trace right pleural effusion. Left chest is clear. No pneumothorax. IMPRESSION: 1. Patchy right perihilar airspace disease and trace right effusion, favor bronchopneumonia over asymmetric edema. 2. Enlarged cardiac silhouette. Electronically Signed   By: Ozell Daring M.D.   On: 07/02/2024 16:02    Microbiology: Results for orders placed or performed during the hospital encounter of 07/02/24  Resp panel by RT-PCR (RSV, Flu A&B, Covid) Anterior Nasal Swab     Status: None   Collection Time: 07/02/24  3:24 PM   Specimen: Anterior Nasal Swab  Result Value Ref Range Status   SARS Coronavirus 2 by RT PCR NEGATIVE NEGATIVE Final   Influenza A by PCR NEGATIVE NEGATIVE Final   Influenza B by PCR NEGATIVE NEGATIVE Final    Comment: (  NOTE) The Xpert Xpress SARS-CoV-2/FLU/RSV plus assay is intended as an aid in the diagnosis of influenza from Nasopharyngeal swab specimens and should not be used as a sole basis for treatment. Nasal washings and aspirates are unacceptable for Xpert Xpress SARS-CoV-2/FLU/RSV testing.  Fact Sheet for Patients: bloggercourse.com  Fact Sheet for Healthcare Providers: seriousbroker.it  This test is not yet approved or cleared by the [place]  FDA and has been authorized for detection and/or diagnosis of SARS-CoV-2 by FDA under an Emergency Use Authorization (EUA). This EUA will remain in effect (meaning this test can be used) for the duration of the COVID-19 declaration under Section 564(b)(1) of the Act, 21 U.S.C. section 360bbb-3(b)(1), unless the authorization is terminated or revoked.     Resp Syncytial Virus by PCR NEGATIVE  NEGATIVE Final    Comment: (NOTE) Fact Sheet for Patients: bloggercourse.com  Fact Sheet for Healthcare Providers: seriousbroker.it  This test is not yet approved or cleared by the [place]  FDA and has been authorized for detection and/or diagnosis of SARS-CoV-2 by FDA under an Emergency Use Authorization (EUA). This EUA will remain in effect (meaning this test can be used) for the duration of the COVID-19 declaration under Section 564(b)(1) of the Act, 21 U.S.C. section 360bbb-3(b)(1), unless the authorization is terminated or revoked.  Performed at [facility], [address], [place], [place] [address]   Respiratory (~20 pathogens) panel by PCR     Status: None   Collection Time: 07/05/24 12:03 PM   Specimen: Nasopharyngeal Swab; Respiratory  Result Value Ref Range Status   Adenovirus NOT DETECTED NOT DETECTED Final   Coronavirus 229E NOT DETECTED NOT DETECTED Final    Comment: (NOTE) The Coronavirus on the Respiratory Panel, DOES NOT test for the novel  Coronavirus (2019 nCoV)    Coronavirus HKU1 NOT DETECTED NOT DETECTED Final   Coronavirus NL63 NOT DETECTED NOT DETECTED Final   Coronavirus OC43 NOT DETECTED NOT DETECTED Final   Metapneumovirus NOT DETECTED NOT DETECTED Final   Rhinovirus / Enterovirus NOT DETECTED NOT DETECTED Final   Influenza A NOT DETECTED NOT DETECTED Final   Influenza B NOT DETECTED NOT DETECTED Final   Parainfluenza Virus 1 NOT DETECTED NOT DETECTED Final   Parainfluenza Virus 2 NOT DETECTED NOT DETECTED Final   Parainfluenza Virus 3 NOT DETECTED NOT DETECTED Final   Parainfluenza Virus 4 NOT DETECTED NOT DETECTED Final   Respiratory Syncytial Virus NOT DETECTED NOT DETECTED Final   Bordetella pertussis NOT DETECTED NOT DETECTED Final   Bordetella Parapertussis NOT DETECTED NOT DETECTED Final   Chlamydophila pneumoniae NOT DETECTED NOT DETECTED Final   Mycoplasma pneumoniae NOT  DETECTED NOT DETECTED Final    Comment: Performed at [facility], [address], [place], [place] [address]    Labs: CBC: Recent Labs  Lab 07/02/24 1528 07/02/24 1539 07/03/24 0448 07/04/24 0229  WBC 5.4  --  6.4 8.1  NEUTROABS 4.4  --   --   --   HGB 8.1* 9.2* 7.6* 8.2*  HCT 29.7* 27.0* 27.7* 29.6*  MCV 95.5  --  94.9 93.4  PLT 270  --  298 283   Basic Metabolic Panel: Recent Labs  Lab 07/02/24 1528 07/02/24 1539 07/03/24 0448 07/04/24 0229 07/04/24 1900 07/06/24 0545  NA 142 142 142 145  --  143  K 3.1* 3.1* 3.7 2.8* 4.1 3.3*  CL 102 100 103 103  --  103  CO2 31  --  30 32  --  31  GLUCOSE 72 72 110* 103*  --  81  BUN 15 15 16 15   --  15  CREATININE 1.05* 1.20* 0.96 0.87  --  0.78  CALCIUM  9.4  --  9.2 9.6  --  9.7   Liver Function Tests: Recent Labs  Lab 07/02/24 1528 07/04/24 0229  AST 29 20  ALT 24 19  ALKPHOS 87 79  BILITOT 0.2 0.3  PROT 6.5 6.5  ALBUMIN 3.6 3.6   CBG: Recent Labs  Lab 07/05/24 1203 07/05/24 1645 07/05/24 2214 07/06/24 0813 07/06/24 1209  GLUCAP 136* 158* 77 103* 126*    Discharge time spent: greater than 30 minutes.  Signed: Sabas GORMAN Brod, MD [facility] 07/06/2024 "

## 2024-07-06 NOTE — Progress Notes (Addendum)
 " Cardiology Progress Note  Patient ID: Susan Farley MRN: [id number] DOB: April 06, 1961 Date of Encounter: 07/06/2024 Primary Cardiologist: None  Subjective   Chief Complaint: SOB  HPI: She reports she is short of breath.  She wants to go home.  Does not appear volume overloaded.  ROS:  All other ROS reviewed and negative. Pertinent positives noted in the HPI.      ECG  The most recent ECG shows sinus rhythm, RSR prime, anterior T wave inversions noted, which I personally reviewed.   Physical Exam   Vitals:   07/06/24 0100 07/06/24 0455 07/06/24 0816 07/06/24 0818  BP: 106/87 109/70 114/64 114/64  Pulse: 88 97 94 88  Resp:   18 17  Temp: 98.1 F (36.7 C) 98.2 F (36.8 C)  97.9 F (36.6 C)  TempSrc:      SpO2: 100% 100% 100% 100%  Weight:      Height:       No intake or output data in the 24 hours ending 07/06/24 0835     07/04/2024    6:00 PM 07/02/2024    2:43 PM 08/15/2023    9:39 AM  Last 3 Weights  Weight (lbs) 215 lb 2.7 oz 226 lb 220 lb  Weight (kg) 97.6 kg 102.513 kg 99.791 kg    Body mass index is 36.93 kg/m.  General: Well nourished, well developed, in no acute distress Head: Atraumatic, normal size  Eyes: PEERLA, EOMI  Neck: Supple, no JVD Endocrine: No thryomegaly Cardiac: Normal S1, S2; RRR; no murmurs, rubs, or gallops Lungs: Diminished breath sounds bilaterally Abd: Soft, nontender, no hepatomegaly  Ext: No edema, pulses 2+ Musculoskeletal: No deformities, BUE and BLE strength normal and equal Skin: Warm and dry, no rashes   Neuro: Alert and oriented to person, place, time, and situation, CNII-XII grossly intact, no focal deficits  Psych: Normal mood and affect   Cardiac Studies  TTE 1/12026  1. Mildly elevated mean gradient (12 mmHg) across aortic valve suggests  mild AS; visually valve opens well.   2. Left ventricular ejection fraction, by estimation, is 60 to 65%. The  left ventricle has normal function. The left ventricle has no regional   wall motion abnormalities. Left ventricular diastolic parameters are  consistent with Grade I diastolic  dysfunction (impaired relaxation). There is the interventricular septum is  flattened in systole and diastole, consistent with right ventricular  pressure and volume overload.   3. Right ventricular systolic function is normal. The right ventricular  size is normal. Tricuspid regurgitation signal is inadequate for assessing  PA pressure.   4. The mitral valve is normal in structure. No evidence of mitral valve  regurgitation. No evidence of mitral stenosis.   5. The aortic valve has an indeterminant number of cusps. Aortic valve  regurgitation is not visualized. Mild aortic valve stenosis.   6. The inferior vena cava is normal in size with greater than 50%  respiratory variability, suggesting right atrial pressure of 3 mmHg.   Patient Profile  Susan Farley is a 64 y.o. female with hypertension, hyperlipidemia, diabetes, stroke in 2015, lung cancer (Stage I SCC s/p LUL resection, on immunotherapy, COPD, OSA not on CPAP admitted on 07/02/2024 for acute hypoxic respiratory failure.  Assessment & Plan   # Acute hypoxic respiratory failure # RV dysfunction - Admitted with possible COPD exacerbation as well as possible pneumonia.  I have reviewed her chest x-ray.  Shows patchy airspace disease.  Her proBNP is elevated.  She  clinically does not appear to have congestive heart failure.  Unclear if this is pneumonia or complicated pneumonia. - Would recommend to hold further IV diuresis.  I would like to get a chest CT with contrast to evaluate for possible pneumonia or complications. - Could also consider right heart catheterization to determine fluid status. - At this point and is not convinced she has congestive heart failure.  - Evaluated by primary team and cardiology this past week.  Given negative D-dimer they felt that she did not have a pulmonary embolism.  We will proceed with a CT  PE study today.  This will get a good look at the lung architecture and exclude pulmonary embolism.  Given her malignancy she would be high risk for PE.    # RV dysfunction - Known history of COPD and OSA not on CPAP.  This could just be pulmonary hypertension related.  She may end up needing a right heart catheterization.  Acutely we will start with a chest CT. - Evaluated for possible PE with negative D-dimer.  Primary team has felt this is not indicated for evaluation. - Proceed with CT PE study today.  We will also get a good look at the lungs to determine if she has pneumonia or complications of pneumonia.  # Lung cancer # Squamous cell status post left upper lobe resection # Reportedly on immunotherapy # Anemia - stable.   # COPD # OSA, untreated - per hospital team.   # Iodine Allergy - Reported iodine allergy.  Has been on prednisone  for several days.  Will give one-time dose of Solu-Medrol  125 mg and benadryl  50 mg 30 minutes before the CT.    For questions or updates, please contact [facility] Please consult [email or web address] for contact info under        Signed, Darryle T. Barbaraann, MD, [facility] [place]  [facility]  07/06/2024 8:35 AM   "

## 2024-07-06 NOTE — Progress Notes (Signed)
 Physical Therapy Treatment Patient Details Name: Susan Farley MRN: [id number] DOB: Feb 19, 1961 Today's Date: 07/06/2024   History of Present Illness 64 y.o. female admitted 07/02/24 with worsening dyspnea, congestion, hypoxia, reported sick contacts over holidays. Workup for acute hypoxic respiratory failure, COPD vs asthma exacerbation, CAP. PMH includes lung CA on immunotherapy, breast CA, asthma/COPD, ongoing tobacco abuse, HTN, HLD, DM2, OSA, CVA, neuropathy, depression.   PT Comments  Pt progressing with mobility. Pt tolerates brief bouts of standing activity with intermittent CGA for balance, stability improved with rollator use. Pt self-limiting activity progression with apparent fatigue and DOE, but pt denies SOB or fatigue/lethargy. Pt remains limited by generalized weakness, decreased activity tolerance, poor balance strategies/postural reactions and impaired cognition. Pt repeatedly asking when she can go home; educ on energy conservation strategies and modifications for safe d/c home with limited assist available. Will continue to follow acutely to address established goals.   SATURATION QUALIFICATIONS: Patient Saturations on Room Air at Rest = 89% Patient Saturations on [employer] while Ambulating = 77% Patient Saturations on 3 Liters of oxygen while Ambulating = 90%      If plan is discharge home, recommend the following: A little help with walking and/or transfers;A little help with bathing/dressing/bathroom;Assistance with cooking/housework;Assist for transportation;Help with stairs or ramp for entrance   Can travel by private vehicle      Yes  Equipment Recommendations  Rollator (4 wheels);BSC/3in1    Recommendations for Other Services       Precautions / Restrictions Precautions Precautions: Fall;Other (comment) Recall of Precautions/Restrictions: Impaired Precaution/Restrictions Comments: Watch SpO2 (does not wear O2 baseline) Restrictions Weight Bearing  Restrictions Per Provider Order: No     Mobility  Bed Mobility Overal bed mobility: Modified Independent Bed Mobility: Supine to Sit, Sit to Supine           General bed mobility comments: HOB elevated, increased time    Transfers Overall transfer level: Needs assistance Equipment used: None, Rollator (4 wheels) Transfers: Sit to/from Stand, Bed to chair/wheelchair/BSC Sit to Stand: Supervision   Step pivot transfers: Contact guard assist       General transfer comment: initial step pivot bed<>BSC without DME, CGA for balance/lines. additional sit<>stand from EOB to rollator, cues for locking/unlocking brakes, supervision for safety; 5x repeated sit<>stand without DME, supervision for safety    Ambulation/Gait Ambulation/Gait assistance: Contact guard assist Gait Distance (Feet): 28 Feet Assistive device: Rollator (4 wheels) Gait Pattern/deviations: Step-through pattern, Decreased stride length, Shuffle, Trunk flexed Gait velocity: Decreased     General Gait Details: slow, fatigued gait with rollator and CGA for balance; pt declines further distance, does not specify why; seems fatigued with some DOE, but pt denies both   Optometrist     Tilt Bed    Modified Rankin (Stroke Patients Only)       Balance Overall balance assessment: Needs assistance Sitting-balance support: No upper extremity supported, Feet supported Sitting balance-Leahy Scale: Good Sitting balance - Comments: assist to don socks   Standing balance support: No upper extremity supported, During functional activity Standing balance-Leahy Scale: Fair Standing balance comment: can stand without UE support; RUE support on bedrail to perform pericare while standing at [facility]                            Communication Communication Communication: No apparent difficulties  Cognition Arousal: Alert Behavior During Therapy:  Flat affect   PT - Cognitive  impairments: No family/caregiver present to determine baseline, Awareness, Attention, Problem solving, Safety/Judgement, Memory                       PT - Cognition Comments: slow to respond and answer questions, seems lethargic but pt reports not fatigued/tired. requires repeated cues; not forthcoming in conversation or regarding symptoms Following commands: Impaired Following commands impaired: Only follows one step commands consistently, Follows multi-step commands inconsistently, Follows multi-step commands with increased time    Cueing Cueing Techniques: Verbal cues  Exercises Other Exercises Other Exercises: 5x repeated sit<>stand (reliant on UE support) in 50 sec    General Comments General comments (skin integrity, edema, etc.): SpO2 77% on RA with using BSC, increase to 83% with seated rest and pursed lip breathing; quick increase to >90% on 3L O2 [place]. pt declines additional ambulation for further oxygen saturations testing; SpO2 90% on 3L O2 [place] with 5x repeated sit<>stand      Pertinent Vitals/Pain Pain Assessment Pain Assessment: No/denies pain Pain Intervention(s): Monitored during session    Home Living Family/patient expects to be discharged to:: Private residence Living Arrangements: Alone                      Prior Function            PT Goals (current goals can now be found in the care plan section) Acute Rehab PT Goals Patient Stated Goal: I want to go home today Progress towards PT goals: Progressing toward goals    Frequency    Min 2X/week      PT Plan      Co-evaluation              AM-PAC PT 6 Clicks Mobility   Outcome Measure  Help needed turning from your back to your side while in a flat bed without using bedrails?: None Help needed moving from lying on your back to sitting on the side of a flat bed without using bedrails?: A Little Help needed moving to and from a bed to a chair (including a wheelchair)?: A  Little Help needed standing up from a chair using your arms (e.g., wheelchair or bedside chair)?: A Little Help needed to walk in hospital room?: A Little Help needed climbing 3-5 steps with a railing? : A Little 6 Click Score: 19    End of Session Equipment Utilized During Treatment: Oxygen Activity Tolerance: Patient tolerated treatment well;Patient limited by fatigue Patient left: in bed;with call bell/phone within reach;with bed alarm set Nurse Communication: Mobility status PT Visit Diagnosis: Muscle weakness (generalized) (M62.81);Other abnormalities of gait and mobility (R26.89)     Time: 9145-9081 PT Time Calculation (min) (ACUTE ONLY): 24 min  Charges:    $Therapeutic Activity: 23-37 mins PT General Charges $$ ACUTE PT VISIT: 1 Visit                      Darice Almas, PT, DPT Acute Rehabilitation Services  Personal: Secure Chat Rehab Office: [phone]  Darice LITTIE Almas 07/06/2024, 2:01 PM

## 2024-07-06 NOTE — Plan of Care (Signed)
  Problem: Education: Goal: Ability to describe self-care measures that may prevent or decrease complications (Diabetes Survival Skills Education) will improve Outcome: Adequate for Discharge Goal: Individualized Educational Video(s) Outcome: Adequate for Discharge   Problem: Coping: Goal: Ability to adjust to condition or change in health will improve Outcome: Adequate for Discharge   Problem: Fluid Volume: Goal: Ability to maintain a balanced intake and output will improve Outcome: Adequate for Discharge   Problem: Health Behavior/Discharge Planning: Goal: Ability to identify and utilize available resources and services will improve Outcome: Adequate for Discharge Goal: Ability to manage health-related needs will improve Outcome: Adequate for Discharge   Problem: Metabolic: Goal: Ability to maintain appropriate glucose levels will improve Outcome: Adequate for Discharge

## 2024-07-06 NOTE — Progress Notes (Signed)
 RNCM received DME orders for RW and 3N1.  Jermaine at [facility] contacted with order and confirmation received.  DME to be delivered to patient's room prior to discharge home today.

## 2024-07-06 NOTE — Progress Notes (Signed)
 [facility]  PROGRESS NOTE  Susan Farley FMW:[id number] DOB: 1960/09/19 DOA: 07/02/2024 PCP: [facility]   Brief HPI:    64 y.o. 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     Patient presents to our facility with worsening dyspnea, nasal congestion and cough found to be notably hypoxic with reported sick contacts over the holidays.  No recent travel other than locally for family holiday events.  Given profound hypoxia from baseline hospitalist called for admission.      Assessment/Plan:   Acute hypoxic respiratory failure, COPD versus asthma exacerbation, POA Concurrent community-acquired pneumonia, POA - Home med rec pending, restart LABA Zaydrian Batta ICS as appropriate - Continue as needed nebs, IV steroids x 1, transition to p.o. prednisone  i - Wheeze already markedly improved although remains hypoxic even at rest (baseline is on room air) - X-ray with notable right middle to upper lobe opacity concerning for overlying infection, azithromycin , ceftriaxone  x 5 days - No clear indication for infection, flu RSV COVID swab negative - Respiratory viral panel ordered - CTA chest ordered per cardiology, will follow results.  Acute diastolic CHF -BNP significantly elevated at 2443 -Diuresed well with IV Lasix  40 mg x 1 -Echocardiogram showed EF of 60 to 65% with grade 1 diastolic dysfunction. -Blood pressure is soft, likely will need more diuresis. Cardiology consulted for diuresis - Diuresis currently on hold, cardiology does not feel that her symptoms are due to heart failure. 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 OSA/obesity hypoventilation syndrome noncompliant with cpap  -willing to trial CPAP here  HLD - Continue rosuvastatin   Tobacco abuse, ongoing -lengthy discussion in regards to cessation given above acute hypoxia, COPD, cancer history      DVT prophylaxis: Lovenox   Medications     azithromycin   500 mg Oral Daily   budesonide  (PULMICORT ) nebulizer solution  2 mg Nebulization Q12H   enoxaparin  (LOVENOX ) injection  40 mg Subcutaneous Q24H   insulin  aspart  0-15 Units Subcutaneous TID WC   insulin  aspart  0-5 Units Subcutaneous QHS   multivitamin with minerals  1 tablet Oral Daily   potassium chloride   40 mEq Oral Once   predniSONE   40 mg Oral Q breakfast   Ensure Max Protein  11 oz Oral Daily   rosuvastatin   40 mg Oral Daily     Data Reviewed:   CBG:  Recent Labs  Lab 07/05/24 0851 07/05/24 1203 07/05/24 1645 07/05/24 2214 07/06/24 0813  GLUCAP 100* 136* 158* 77 103*    SpO2: 100 % O2 Flow Rate (L/min): 3 L/min    Vitals:   07/06/24 0100 07/06/24 0455 07/06/24 0816 07/06/24 0818  BP: 106/87 109/70 114/64 114/64  Pulse: 88 97 94 88  Resp:   18 17  Temp: 98.1 F (36.7 C) 98.2 F (36.8 C)  97.9 F (36.6 C)  TempSrc:      SpO2: 100% 100% 100% 100%  Weight:  Height:          Data Reviewed:  Basic Metabolic Panel: Recent Labs  Lab 07/02/24 1528 07/02/24 1539 07/03/24 0448 07/04/24 0229 07/04/24 1900 07/06/24 0545  NA 142 142 142 145  --  143  K 3.1* 3.1* 3.7 2.8* 4.1 3.3*  CL 102 100 103 103  --  103  CO2 31  --  30 32  --  31  GLUCOSE 72 72 110* 103*  --  81  BUN 15 15 16 15   --  15  CREATININE 1.05* 1.20* 0.96 0.87  --  0.78  CALCIUM  9.4  --  9.2 9.6   --  9.7    CBC: Recent Labs  Lab 07/02/24 1528 07/02/24 1539 07/03/24 0448 07/04/24 0229  WBC 5.4  --  6.4 8.1  NEUTROABS 4.4  --   --   --   HGB 8.1* 9.2* 7.6* 8.2*  HCT 29.7* 27.0* 27.7* 29.6*  MCV 95.5  --  94.9 93.4  PLT 270  --  298 283    LFT Recent Labs  Lab 07/02/24 1528 07/04/24 0229  AST 29 20  ALT 24 19  ALKPHOS 87 79  BILITOT 0.2 0.3  PROT 6.5 6.5  ALBUMIN 3.6 3.6     Antibiotics: Anti-infectives (From admission, onward)    Start     Dose/Rate Route Frequency Ordered Stop   07/03/24 1700  cefTRIAXone  (ROCEPHIN ) 2 g in sodium chloride  0.9 % 100 mL IVPB        2 g 200 mL/hr over 30 Minutes Intravenous Every 24 hours 07/02/24 1905 07/08/24 1659   07/03/24 1000  azithromycin  (ZITHROMAX ) tablet 500 mg        500 mg Oral Daily 07/02/24 1905 07/08/24 0959   07/02/24 1630  cefTRIAXone  (ROCEPHIN ) 1 g in sodium chloride  0.9 % 100 mL IVPB        1 g 200 mL/hr over 30 Minutes Intravenous  Once 07/02/24 1623 07/02/24 1826   07/02/24 1630  azithromycin  (ZITHROMAX ) tablet 500 mg        500 mg Oral  Once 07/02/24 1623 07/02/24 1733        CONSULTS   Code Status:   Family Communication:      Subjective   Patient seen and examined.  Feels better today.  Still requiring 2 to 3 L of oxygen via nasal cannula.  Cardiology has ordered CT chest to rule out PE as well as underlying lung pathology causing symptoms.  Patient does have a history of lung cancer.   Objective    Physical Examination:  Appears in no acute distress S1-S2, regular Lungs clear to auscultation bilaterally Abdomen is soft, nontender, no organomegaly Extremities no edema         Susan Farley Susan Farley   [facility] If 7PM-7AM, please contact night-coverage at [email or web address], Office  [phone]   07/06/2024, 8:38 AM  LOS: 4 days

## 2024-07-06 NOTE — Progress Notes (Signed)
" °   07/06/24 1356  AVS Discharge Documentation  AVS Discharge Instructions Including Medications Provided to patient/caregiver  Name of Person Receiving AVS Discharge Instructions Including Medications Candis Kenner  Name of Clinician That Reviewed AVS Discharge Instructions Including Medications Discharge RN    "

## 2024-07-06 NOTE — Progress Notes (Signed)
 SATURATION QUALIFICATIONS: (This note is used to comply with regulatory documentation for home oxygen)  Patient Saturations on Room Air at Rest = 99%  Patient Saturations on Room Air while Ambulating = 95%  Patient Saturations on 3 Liters of oxygen while Ambulating = 100%

## 2024-08-01 ENCOUNTER — Ambulatory Visit: Admitting: Podiatry
